# Patient Record
Sex: Female | Born: 1975
Health system: Southern US, Community
[De-identification: ages and names within clinical notes are randomized; demographics above are authoritative.]

## PROBLEM LIST (undated history)

## (undated) DIAGNOSIS — G4730 Sleep apnea, unspecified: Secondary | ICD-10-CM

## (undated) DIAGNOSIS — R112 Nausea with vomiting, unspecified: Secondary | ICD-10-CM

## (undated) DIAGNOSIS — I7300 Raynaud's syndrome without gangrene: Secondary | ICD-10-CM

## (undated) DIAGNOSIS — Z9889 Other specified postprocedural states: Secondary | ICD-10-CM

## (undated) DIAGNOSIS — M1990 Unspecified osteoarthritis, unspecified site: Secondary | ICD-10-CM

## (undated) DIAGNOSIS — E119 Type 2 diabetes mellitus without complications: Secondary | ICD-10-CM

## (undated) HISTORY — PX: WRIST SURGERY: SHX841

## (undated) HISTORY — PX: FOOT SURGERY: SHX648

## (undated) HISTORY — PX: WISDOM TOOTH EXTRACTION: SHX21

## (undated) HISTORY — PX: OOPHORECTOMY: SHX86

## (undated) HISTORY — PX: BACK SURGERY: SHX140

## (undated) HISTORY — PX: OTHER SURGICAL HISTORY: SHX169

---

## 1998-01-10 ENCOUNTER — Other Ambulatory Visit: Admission: RE | Admit: 1998-01-10 | Discharge: 1998-01-10 | Payer: Self-pay | Admitting: Family Medicine

## 1999-04-02 ENCOUNTER — Encounter: Admission: RE | Admit: 1999-04-02 | Discharge: 1999-04-02 | Payer: Self-pay | Admitting: Family Medicine

## 1999-09-26 ENCOUNTER — Encounter: Payer: Self-pay | Admitting: Orthopedic Surgery

## 1999-09-26 ENCOUNTER — Encounter: Admission: RE | Admit: 1999-09-26 | Discharge: 1999-09-26 | Payer: Self-pay | Admitting: Orthopedic Surgery

## 1999-10-28 ENCOUNTER — Other Ambulatory Visit: Admission: RE | Admit: 1999-10-28 | Discharge: 1999-10-28 | Payer: Self-pay | Admitting: Obstetrics and Gynecology

## 2000-01-15 ENCOUNTER — Ambulatory Visit (HOSPITAL_COMMUNITY): Admission: RE | Admit: 2000-01-15 | Discharge: 2000-01-15 | Payer: Self-pay | Admitting: Obstetrics and Gynecology

## 2000-01-15 ENCOUNTER — Encounter: Payer: Self-pay | Admitting: Obstetrics and Gynecology

## 2000-03-31 ENCOUNTER — Encounter: Admission: RE | Admit: 2000-03-31 | Discharge: 2000-06-29 | Payer: Self-pay | Admitting: Obstetrics and Gynecology

## 2000-05-19 ENCOUNTER — Inpatient Hospital Stay (HOSPITAL_COMMUNITY): Admission: AD | Admit: 2000-05-19 | Discharge: 2000-05-19 | Payer: Self-pay | Admitting: Obstetrics and Gynecology

## 2000-05-26 ENCOUNTER — Inpatient Hospital Stay (HOSPITAL_COMMUNITY): Admission: AD | Admit: 2000-05-26 | Discharge: 2000-05-28 | Payer: Self-pay | Admitting: Obstetrics and Gynecology

## 2000-05-29 ENCOUNTER — Encounter: Admission: RE | Admit: 2000-05-29 | Discharge: 2000-08-27 | Payer: Self-pay | Admitting: Obstetrics and Gynecology

## 2000-06-30 ENCOUNTER — Other Ambulatory Visit: Admission: RE | Admit: 2000-06-30 | Discharge: 2000-06-30 | Payer: Self-pay | Admitting: Obstetrics and Gynecology

## 2000-11-23 ENCOUNTER — Encounter (INDEPENDENT_AMBULATORY_CARE_PROVIDER_SITE_OTHER): Payer: Self-pay | Admitting: Specialist

## 2000-11-23 ENCOUNTER — Ambulatory Visit (HOSPITAL_COMMUNITY): Admission: RE | Admit: 2000-11-23 | Discharge: 2000-11-23 | Payer: Self-pay | Admitting: Obstetrics and Gynecology

## 2000-11-23 ENCOUNTER — Encounter (INDEPENDENT_AMBULATORY_CARE_PROVIDER_SITE_OTHER): Payer: Self-pay

## 2000-12-22 ENCOUNTER — Encounter: Admission: RE | Admit: 2000-12-22 | Discharge: 2001-03-22 | Payer: Self-pay | Admitting: Neurology

## 2001-03-15 ENCOUNTER — Encounter: Payer: Self-pay | Admitting: Neurological Surgery

## 2001-03-15 ENCOUNTER — Observation Stay (HOSPITAL_COMMUNITY): Admission: RE | Admit: 2001-03-15 | Discharge: 2001-03-16 | Payer: Self-pay | Admitting: Neurological Surgery

## 2001-04-05 ENCOUNTER — Encounter: Payer: Self-pay | Admitting: Neurological Surgery

## 2001-04-05 ENCOUNTER — Encounter: Admission: RE | Admit: 2001-04-05 | Discharge: 2001-04-05 | Payer: Self-pay | Admitting: Neurological Surgery

## 2001-07-15 ENCOUNTER — Other Ambulatory Visit: Admission: RE | Admit: 2001-07-15 | Discharge: 2001-07-15 | Payer: Self-pay | Admitting: Obstetrics and Gynecology

## 2002-03-25 ENCOUNTER — Ambulatory Visit (HOSPITAL_BASED_OUTPATIENT_CLINIC_OR_DEPARTMENT_OTHER): Admission: RE | Admit: 2002-03-25 | Discharge: 2002-03-25 | Payer: Self-pay | Admitting: Orthopedic Surgery

## 2002-07-06 ENCOUNTER — Ambulatory Visit (HOSPITAL_COMMUNITY): Admission: RE | Admit: 2002-07-06 | Discharge: 2002-07-06 | Payer: Self-pay | Admitting: Obstetrics and Gynecology

## 2002-07-06 ENCOUNTER — Encounter: Payer: Self-pay | Admitting: Obstetrics and Gynecology

## 2002-07-19 ENCOUNTER — Observation Stay (HOSPITAL_COMMUNITY): Admission: EM | Admit: 2002-07-19 | Discharge: 2002-07-20 | Payer: Self-pay

## 2002-08-03 ENCOUNTER — Other Ambulatory Visit: Admission: RE | Admit: 2002-08-03 | Discharge: 2002-08-03 | Payer: Self-pay | Admitting: Obstetrics and Gynecology

## 2002-09-28 ENCOUNTER — Encounter: Payer: Self-pay | Admitting: Obstetrics and Gynecology

## 2002-09-28 ENCOUNTER — Ambulatory Visit (HOSPITAL_COMMUNITY): Admission: RE | Admit: 2002-09-28 | Discharge: 2002-09-28 | Payer: Self-pay | Admitting: Obstetrics and Gynecology

## 2003-02-25 ENCOUNTER — Inpatient Hospital Stay (HOSPITAL_COMMUNITY): Admission: AD | Admit: 2003-02-25 | Discharge: 2003-02-27 | Payer: Self-pay | Admitting: Obstetrics and Gynecology

## 2003-04-14 ENCOUNTER — Emergency Department (HOSPITAL_COMMUNITY): Admission: EM | Admit: 2003-04-14 | Discharge: 2003-04-14 | Payer: Self-pay | Admitting: Emergency Medicine

## 2004-09-04 ENCOUNTER — Other Ambulatory Visit: Admission: RE | Admit: 2004-09-04 | Discharge: 2004-09-04 | Payer: Self-pay | Admitting: Obstetrics and Gynecology

## 2004-10-08 ENCOUNTER — Emergency Department (HOSPITAL_COMMUNITY): Admission: EM | Admit: 2004-10-08 | Discharge: 2004-10-09 | Payer: Self-pay | Admitting: Emergency Medicine

## 2004-11-20 ENCOUNTER — Encounter: Admission: RE | Admit: 2004-11-20 | Discharge: 2004-11-20 | Payer: Self-pay | Admitting: General Surgery

## 2008-09-15 ENCOUNTER — Ambulatory Visit (HOSPITAL_COMMUNITY): Admission: RE | Admit: 2008-09-15 | Discharge: 2008-09-15 | Payer: Self-pay | Admitting: Obstetrics and Gynecology

## 2008-09-15 ENCOUNTER — Encounter (INDEPENDENT_AMBULATORY_CARE_PROVIDER_SITE_OTHER): Payer: Self-pay | Admitting: Obstetrics and Gynecology

## 2010-06-02 ENCOUNTER — Encounter (HOSPITAL_BASED_OUTPATIENT_CLINIC_OR_DEPARTMENT_OTHER): Payer: Self-pay | Admitting: General Surgery

## 2010-08-20 LAB — CBC
HCT: 38 % (ref 36.0–46.0)
Hemoglobin: 13 g/dL (ref 12.0–15.0)

## 2010-09-24 NOTE — Op Note (Signed)
NAME:  Miranda Ross, Miranda Ross                ACCOUNT NO.:  [id number]   MEDICAL RECORD NO.:  [id number]          PATIENT TYPE:  AMB   LOCATION:  SDC                           FACILITY:  [facility]   PHYSICIAN:  Dois Davenport A. Rivard, M.D. DATE OF BIRTH:  [birth year]   DATE OF PROCEDURE:  DATE OF DISCHARGE:                               OPERATIVE REPORT   PREOPERATIVE DIAGNOSES:  Endometrial polyp and contraceptive management.   POSTOPERATIVE DIAGNOSES:  Endometrial polyp and contraceptive  management.   ANESTHESIA:  General, Raul Del, MD   PROCEDURES:  Hysteroscopy, dilation and curettage of expired Mirena  intrauterine device and insertion of new Mirena intrauterine device.   SURGEON:  Crist Fat. Rivard, MD   ASSISTANT:  None.   ESTIMATED BLOOD LOSS:  Minimal.   PROCEDURE IN DETAIL:  After being informed of the planned procedure with  possible complications including bleeding, infection, injury to uterus  and contraceptive benefits and risks of the IUD, informed consent was  obtained.  The patient was taken to OR #3, given general anesthesia with  endotracheal intubation without any complication.  She was placed in the  lithotomy position, prepped and draped in a sterile fashion.  Her  bladder was emptied with an in-and-out red rubber catheter.  Her pelvic  exam was limited by body habitus.  Uterus appears to be anteverted and  normal size.  Adnexa were not felt.   A weighted speculum was inserted.  Anterior lip of the cervix was  grasped with a tenaculum forceps and the old Mirena IUD was removed.  The uterus was then sounded at 8.5 cm and the cervix was easily dilated  using Hegar dilator until #25, which allowed easy entry of a diagnostic  hysteroscope, followed by a operative hysteroscope.  With perfusion of  sorbitol at a maximum pressure of 80 mmHg, we visualized the entire  uterine cavity with both tubal ostia which revealed a decidualized  endometrium.  No evidence of polyp.   Hysteroscope was then removed.  A  sharp curette was used to curette the uterine cavity, which removed a  small amount of normal-appearing endometrium and the hysteroscope was  reinserted to visualize the empty cavity.   Hysteroscope was removed.  The new Mirena IUD was inserted per protocol  and strings were shortened and the tenaculum was removed.   Instruments and sponge count was complete x2.  Estimated blood loss was  minimal.  The procedure was well tolerated by the patient, who was taken  to recovery room and discharged home in a well and stable condition.   SPECIMEN:  Endometrial curettings sent to Pathology.      Crist Fat Rivard, M.D.  Electronically Signed     SAR/MEDQ  D:  09/15/2008  T:  09/16/2008  Job:  [id number]

## 2010-09-24 NOTE — Op Note (Signed)
NAME:  Miranda Ross, Miranda Ross                ACCOUNT NO.:  [id number]   MEDICAL RECORD NO.:  [id number]          PATIENT TYPE:  AMB   LOCATION:  SDC                           FACILITY:  [facility]   PHYSICIAN:  Dois Davenport A. Rivard, M.D. DATE OF BIRTH:  1976-02-01   DATE OF PROCEDURE:  DATE OF DISCHARGE:                               OPERATIVE REPORT   PREOPERATIVE DIAGNOSES:  Endometrial polyps and contraceptive  management.   POSTOPERATIVE DIAGNOSES:  Endometrial polyps and contraceptive  management.   ANESTHESIA:  General, Raul Del, MD   PROCEDURES:  Hysteroscopy, dilatation and curettage, removal of expired  Mirena intrauterine device, insertion of new Mirena intrauterine device.   SURGEON:  Crist Fat. Rivard, MD   ASSISTANT:  None.   ESTIMATED BLOOD LOSS:  Minimal.   PROCEDURE IN DETAIL:  After being informed of the planned procedure with  possible complications including bleeding, infection, injury to uterus  and loss of the IUD, informed consent was obtained.  The patient was  taken to OR #3, given general anesthesia with endotracheal intubation  without any complication.  She was placed in the lithotomy position,  prepped and draped in a sterile fashion and her bladder was emptied with  an in-and-out red rubber catheter.  Pelvic exam was limited by body  habitus, but the uterus appears to be anteverted and normal size and  volume.  Adnexa are not felt.   A weighted speculum was inserted.  Anterior lip of the cervix was  grasped with a tenaculum forceps and the IUD string was grasped, and IUD  was removed.  The cervix was re-prepped with Betadine and the uterus was  sounded at 8.5 cm.  The cervix was easily dilated using Hegar dilator  until #25 which allows easy entry of a diagnostic hysteroscope, followed  by a operative hysteroscope.  With perfusion of sorbitol at a maximum  pressure of 80 mmHg, we were able to visualize the entire uterine cavity  and see more of an irregular  decidualized endometrium than polyps.  The  hysteroscope was removed and we proceeded with sharp curettage of the  endometrial cavity removing a small amount of normal-appearing tissue.  Post curettage, we revisualized the endometrial cavity which appeared  normal.  Hysteroscope was removed and the new Mirena IUD was inserted  per protocol, strings were shortened, and the tenaculum was removed.  Instruments and sponge count was complete x2.  Estimated blood loss  was minimal.  Water deficit was 35 mL.  The procedure was well tolerated  by the patient, who was taken to recovery room in a well and stable  condition.   SPECIMEN:  Endometrial curettings to Pathology.      Crist Fat Rivard, M.D.  Electronically Signed     SAR/MEDQ  D:  09/15/2008  T:  09/16/2008  Job:  [id number]

## 2010-09-27 NOTE — H&P (Signed)
[facility]  Patient:    Miranda Ross, Miranda Ross                        MRN: [id number] Adm. Date:  11/20/00 Attending:  Silverio Lay, M.D.                         History and Physical  DATE OF BIRTH:                [birth year]  REASON FOR ADMISSION:         The patient will undergo left ovarian cystectomy via laparoscopy on November 23, 2000.  HISTORY OF PRESENT ILLNESS:   This is a 35 year old married white female, gravida 1, para 1, abortus 0, with a recent spontaneous vaginal delivery on May 26, 2000, without complication.  During her pregnancy, she was diagnosed with a pelvic mass which essentially remained unchanged throughout her pregnancy.  It was a left ovarian cyst measuring 6.4 x 4.4 x 5.7 cm with small septations.  In February 2002, she had a repeat ultrasound showing left ovarian cyst, 3.8 x 3.4 x 3.3 cm, with multiple septations.  She was placed on Depo-Provera with her last injection in May 2002.  On October 23, 2000, we repeated ultrasound which showed an unchanged left ovarian cyst, 4.5 x 3.3 x 3.6 cm, with a few thin septations and a slight increase from previous ultrasound of February 2002.  Right ovary was within normal limits despite a polycystic ovarian appearance already known for that patient. Uterus is normal, and endometrium is normal.  Otherwise, the patient complains of left lower quadrant pain on and off, stabbing like, no radiation, and this pain has been persistent despite Depo-Provera and complete amenorrhea since March 2002.  She otherwise denies dyspareunia.  REVIEW OF SYSTEMS:            Constitutional: Normal.  HEENT: Normal. Cardiovascular: Normal.  Pulmonary: Patient known to have mild asthma, stable, not using inhalers.  Gastrointestinal: Normal.  Joint/Muscles: Normal. Breasts: Normal.  Neurological: Normal.  Endocrinological: Normal.  PAST MEDICAL HISTORY:         History of polycystic ovarian syndrome.   Mild asthma, and obesity.  The patient had hernia surgery at age 38.  ALLERGIES:                    No known drug allergies.  MEDICATIONS:                  Albuterol p.r.n.  FAMILY HISTORY:               Father deceased of non-Hodgkins lymphoma. Mother alive status post uterine cancer.  SOCIAL HISTORY:               Married, nonsmoker.  Works as a Actuary.  PHYSICAL EXAMINATION:  VITAL SIGNS:                  Normal.  HEENT:                        Negative.  LUNGS:                        Clear.  HEART:                        Normal.  ABDOMEN:  Globulus, normal, with mild tenderness in the left lower quadrant.  No guarding, no rebound.  SKIN:                         Negative.  GYNECOLOGIC:                  Vulva, vagina, cervix normal.  Uterus anteverted and normal.  Adnexa: Exam limited by body habitus.  EXTREMITIES:                  Negative.  ASSESSMENT:                   Persistent ovarian cyst with chronic pelvic pain.  PLAN:                         The patient will undergo laparoscopy cystectomy on November 23, 2000.  Procedure as well as possible complications have been well reviewed with the patient including bleeding, infection, injury to bowels, bladder, or ureters, need for laparotomy, possibility of losing ovary or tube. Her tumor markers have been normal with CA125, CEA, and LDH.  She will use abowel preparation for surgery.  She is also aware that, because of her weight, laparoscopy may be impossible. DD:  11/20/00 TD:  11/20/00 Job: [id number]

## 2010-09-27 NOTE — Op Note (Signed)
[facility]  Patient:    RUTA, CAPECE Visit Number: [id number] MRN: [id number]          Service Type: SUR Location: [id number] Attending Physician:  Jonne Ply Dictated by:   Stefani Dama, M.D. Proc. Date: 03/15/01 Admit Date:  03/15/2001 Discharge Date: 03/16/2001                             Operative Report  PREOPERATIVE DIAGNOSIS:  C5-6 herniated nucleus pulposus with spondylosis, with right cervical radiculopathy.  POSTOPERATIVE DIAGNOSIS:  C5-6 herniated nucleus pulposus with spondylosis, with right cervical radiculopathy.  PROCEDURES: 1. Anterior cervical diskectomy and arthrodesis, C5-6. 2. Structural allograft. 2. Synthes fixation.  SURGEON:  Stefani Dama, M.D.  FIRST ASSISTANT:  Cristi Loron, M.D.  ANESTHESIA:  General endotracheal.  INDICATION:  The patient is a 35 year old white female who has had significant neck, shoulder, and bilateral arm pain, with the right being much more significantly involved than the left.  She has profound spondylitic disease at the C5-6 level singularly and a herniated nucleus pulposus eccentric to the right side.  She was advised regarding surgical decompression.  DESCRIPTION OF PROCEDURE:  The patient was brought to the operating room and placed on the table in supine position.  After the smooth induction of general endotracheal anesthesia, she was placed in five pounds of Holter traction. The neck was shaved, prepped with Duraprep, and draped in a sterile fashion . A transverse incision was made in the midportion of the neck and carried down through the platysma.  The plane between the sternocleidomastoid and the strap muscles was dissected bluntly until the prevertebral space was reached.  The first identifiable disk space was noted to be that of C3-4 and was radiographed.  Dissection was easily carried to the C5-6 space, and the C5-6 disk was then identified  positively.  The longus colli muscle was stripped on either side so that a Caspar retractor could be placed under both of these muscles.  The disk space was then opened with 15 blade, and a combination of Kerrison rongeurs was use to remove a significant amount of moderately degenerated disk material.  As the posterior longitudinal ligament was reached, there was noted to be a significant osteophyte off the inferior margin of the body of C5 out to the lateral recess on either side.  The dissection was carried down with a 2.3 mm dissecting tool and a high-speed drill.  An uncinate process spur was removed on the right side at C5-6.  The left side was opened up with the 2 and 3 mm Kerrison punch.  The posterior longitudinal ligament was opened similarly once the osteophytes were removed to allow for a good decompression of the common dural tube and the takeoff of both nerve roots.  Once this was accomplished, hemostasis was achieved in the soft tissues in the epidural space with some Gelfoam-soaked thrombin pledgets, which were later irrigated away.  A 7 mm tricortical bone graft was fitted with the cortical surface facing dorsally.  The anterior space was then secured with a 16 mm standard-size Synthes plate and four locking 4 x 14 mm screws.  The area was checked radiographically for its fit and then with soft tissue hemostasis being obtained, the platysma was closed with 3-0 Vicryl in interrupted fashion, 3-0 Vicryl was used in the subcuticular tissues.  A clear plastic dressing was placed on the skin.  The patient tolerated the procedure well and was returned to recovery in stable condition. Dictated by:   Stefani Dama, M.D. Attending Physician:  Jonne Ply DD:  03/15/01 TD:  03/16/01 Job: [id number]

## 2010-09-27 NOTE — Discharge Summary (Signed)
NAME:  Miranda Ross, Miranda Ross                          ACCOUNT NO.:  [id number]   MEDICAL RECORD NO.:  [id number]                   PATIENT TYPE:  OBV   LOCATION:  A420                                 FACILITY:  [facility]   PHYSICIAN:  Langley Gauss, M.D.                DATE OF BIRTH:  12-Jun-1975   DATE OF ADMISSION:  07/19/2002  DATE OF DISCHARGE:  07/20/2002                                 DISCHARGE SUMMARY   DIAGNOSES:  1. An 8-week intrauterine pregnancy.  2. Gastroenteritis with nausea, vomiting, and diarrhea.  3. Metabolic disturbance as presented with evidence of dehydration with a     sodium of 130 upon initial laboratory studies.   DISPOSITION:  The patient is to follow up with her M.D. or midwife in  [place] as previously needed.  The patient did present to [facility]  Emergency Room on July 19, 2002 in the p.m. and is an OB unassigned patient.   DISCHARGE MEDICATIONS:  The patient is to continue with Phenergan  suppositories as needed.  In addition, she has Zofran tablets which she has  been using for her first trimester hyperemesis.  I wrote her for Lomotil one  p.o. p.r.n. post each loose stool, up to a max of six per day, #12 with no  refill.   HISTORY:  This is a 35 year old gravida 2, para 1 at [redacted] weeks gestation who  presents to [facility] complaining of an entire day duration of  nausea with vomiting and diarrhea, both occurring too many times to count.  The diarrhea is described as very copious and watery in nature.  She also  describes abdominal spasms.  The patient states she is feeling cold with  chills but she denies any evidence of any fever, stating that her  temperature has been around 100 when she did check it.  Her history is  pertinent in initially her 36-year-old son became ill with similar symptoms  two days previously and most recently her husband has been ill with the  identical symptoms.  The patient did contact her OB care [facility] Scarpulla on July 19, 2002, at which time Phenergan suppositories were called in; however, due  to the patient's diarrhea she was unable to tolerate these and did not  absorb these per rectally.   The patient denies any significant prior history of irritable bowel  syndrome.  She does not experience chronic diarrhea or chronic constipation.   REVIEW OF SYSTEMS:  Pertinent for no complaints of leakage of fluid, no  change in vaginal discharge, no vaginal bleeding.  She does state she has  had no problems with this pregnancy so far with the exception of some  nausea, which was well treated with the p.o. Zofran.  She states that with  her first pregnancy she had significant first trimester hyperemesis.   PAST MEDICAL HISTORY:  1. She had prior C5-6 fusion.  2. Left oophorectomy previously for a cyst.  3. She has history of a right wrist surgery November 2003 for tendon     surgery.   CURRENT MEDICATIONS:  Prenatal vitamins.   ALLERGIES:  She has no known drug allergies.   PAST OBSTETRIC HISTORY:  The patient has one prior delivery of a 35-year-old  infant.  She denies any history of diabetes or hypertension.   SOCIAL HISTORY:  The patient is a nonsmoker.  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   FAMILY HISTORY:  Pertinent for a sister with asthma.   PHYSICAL EXAMINATION:  GENERAL:  An obese female.  VITAL SIGNS:  Temperature 98.1, blood pressure 127/73, heart rate of 126,  respiratory rate is 20.  NECK:  Supple.  Thyroid is nonpalpable.  LUNGS:  Clear.  CARDIOVASCULAR:  Regular rate and rhythm.  ABDOMEN:  Soft and nontender but morbidly obese.  Minimally enlarged uterus  that is nonpalpable.  EXTREMITIES:  Normal.  PELVIC:  Examination not performed although there is noted to be no evidence  of any vaginal bleeding or  leakage of fluid or any change in vaginal  discharge.   LABORATORY STUDIES:  White count of 8.9, hemoglobin 12.3, hematocrit 36.3,  MCV is 81.9.   Liver function tests within normal limits.  Amylase and lipase  within normal limits.  Electrolytes slightly abnormal with a sodium of 130.  BUN and creatinine within normal limits.  A urine specimen obtained about 30  minutes after initiation of IV fluids is negative for ketones, negative for  protein, negative for nitrites, and negative for esterase.   ASSESSMENT:  A 35 year old gravida 2, para 1 at [redacted] weeks gestation with  recent exposure to a probable viral gastroenteritis, now with symptoms  herself.  She has been doing very poorly at home with these with significant  nausea and vomiting today resulting in the metabolic disturbance.  The  patient presented to [facility] Emergency Room the p.m. of July 19, 2002, at  which time she was initially cared for by Dr. Mosetta Putt.  When I was contacted,  the patient had already received 2 L of half-normal saline and had continued  to experience orthostatic symptoms, as well as continued nausea and no p.o.  intake.  Thus, I was contacted the p.m. of July 19, 2002 and verbal orders  were given to place the patient as an observation status to be rehydrated  with D5 LR and continued on a full-liquid diet.   When I evaluated the patient the a.m. of July 20, 2002, the patient had  taken in very small sips of orange juice only.  She was able to ambulate and  void but other than that had minimal ambulation.  She still had some nausea  and had experienced diarrhea that a.m.  Thus, after my initial evaluation,  the patient did describe a very good appetite.  She was advanced to a  regular diet and treated with two p.o. Lomotil for diarrheal stools.  Subsequently, on evaluation the p.m. of July 20, 2002, the patient has  marked improvement with the appetite and nausea.  She has tolerated a  regular general diet for lunch and after the two Lomotil has had no  recurrent diarrhea; however, she is at risk for recurrence of diarrhea, thus she is to be continued  on p.o. Lomotil as clinically indicated at home.   FINAL DIAGNOSES:  1. An 8-week intrauterine pregnancy.  2.  Gastroenteritis with metabolic disturbance.  The patient presenting to     [facility] as an OB unassigned patient July 19, 2002, evaluated, and     discharged from observation status by myself on July 20, 2002.                                              Langley Gauss, M.D.   DC/MEDQ  D:  07/20/2002  T:  07/20/2002  Job:  [id number]

## 2010-09-27 NOTE — H&P (Signed)
NAME:  Miranda Ross, Miranda Ross                          ACCOUNT NO.:  [id number]   MEDICAL RECORD NO.:  [id number]                   PATIENT TYPE:  AMB   LOCATION:  DSC                                  FACILITY:  [facility]   PHYSICIAN:  Cindee Salt, M.D.                    DATE OF BIRTH:  [birth year]   DATE OF ADMISSION:  03/25/2002  DATE OF DISCHARGE:                                HISTORY & PHYSICAL   PREOPERATIVE DIAGNOSES:  TFCC tear right wrist.   POSTOPERATIVE DIAGNOSES:  TFCC tear right wrist.   OPERATION:  Arthroscopy with debridement of tear right wrist.   SURGEON:  Cindee Salt, M.D.   Threasa HeadsBerneda Rose.   ANESTHESIA:  Axillary block.   DATE OF OPERATION:  March 25, 2002.   HISTORY OF PRESENT ILLNESS:  The patient is a 35 year old female with a  history of fall and injury to her right wrist. She complains of pain in the  ulnar aspect. MRI reveals TFCC. The patient was brought to the OR where an  axillary block was carried out without difficulty. She was prepped and  draped using Betadine scrub and solution with the right arm free. The limb  was placed in the arthroscopy tub and 10 pounds of traction applied. The  joint was inflated to 3-4 portal. A portal was opened with a transverse  incision with hemostat. One trochar was used to center the joint. The joint  was inspected. The volar radial wrist ligaments were intact. Ligament was  intact. The volar ulnar wrist ligaments were intact and there were no  cartilaginous lesions. The triangular fiber cartilage complex was inspected.  One tear was noted after placement of an irrigation catheterization in 6U  and opening of 4-5 portal after localization with a #22 gauge needle. A  probe was introduced in the tear probe. This was found to lie in the central  aspect without involvement of either dorsal or palmar ligaments. An arthro  wand was then inserted and the central aspect of the TFCC was then removed.  This was done with the  Arthro wand and suction basket forceps. The dorsal  palmar ligaments were then partially shrunk. The scope was introduced in the  4-5 portal. The ulnar side ligaments were inspected. The lunar __  joint and  volar ulnar ligaments were intact. The carpal joint was then inspected after  inflation through the distal 3-4 portal. The mid carpal joint showed no  instability of the scapholunate and lunar __. There were no cartilaginous  lesions. An irrigation catheter was placed in the 4-5 distal portal and the  carpal joint. No further lesions were identified. The instruments were  removed. The portal was closed with interrupted 5-0 Nylon sutures. Sterile  compressive dressing and splint was applied. The patient tolerated the  procedure well and was taken to the recovery room  for observation in  satisfactory condition.   DISPOSITION:  She is discharged to home.   FOLLOW UP:  To return to the [facility] [place] in one week.                                              Cindee Salt, M.D.   GK/MEDQ  D:  03/25/2002  T:  03/25/2002  Job:  [id number]

## 2010-09-27 NOTE — H&P (Signed)
NAME:  Miranda Ross, Miranda Ross                          ACCOUNT NO.:  [id number]   MEDICAL RECORD NO.:  [id number]                   PATIENT TYPE:  INP   LOCATION:  9165                                 FACILITY:  [facility]   PHYSICIAN:  Crist Fat. Rivard, M.D.              DATE OF BIRTH:  06-07-1975   DATE OF ADMISSION:  02/25/2003  DATE OF DISCHARGE:                                HISTORY & PHYSICAL   HISTORY OF PRESENT ILLNESS:  Miranda Ross is a 35 year old gravida 2, para 1-0-  0-1 who presents at 39-2/7 weeks.  EDD March 02, 2003 by first trimester  ultrasound.  She presents for induction of labor, secondary to LGA; with  favorable cervix, positive fetal movement, no bleeding, no rupture of  membranes.  She denies any PIH symptoms.   PRESENT PREGNANCY:  Her pregnancy has been followed by the M.D. service at  [facility] and is remarkable for:  1. Obesity.  2. Questionable LMP.  3. First trimester bleeding.  4. History of ovarian cyst, left ovary.  Removed due to cysts in 2002.  5. History of back surgery, with fusion of C5 and C6 in 2002.  6. History of asthma.  7. History of gestational diabetes with the first pregnancy.  She has had     two normal Glucola tests with this pregnancy.  8. Group B strep negative.   This patient was initially evaluated at the office of [facility] on July 13, 2002,  at approximately seven weeks gestation.  EDC determined by first trimester  ultrasound.  The patient's pregnancy has been remarkable for low weight  gain.  She has gained only two pounds with this pregnancy, beginning her  pregnancy at 280 pounds, and at last OB visit 282 pounds.  She did lose  weight initially in the first and second trimester with her weight, falling  to 274 pounds at [redacted] weeks gestation, before stabilizing at 280.  She had  some nausea and vomiting initially with her pregnancy; difficulty with  control (she used Zofran and Phenergan and also Imodium for diarrhea).  Mid  second trimester  nausea and vomiting was improved and the patient began  feeling better.  She has measured size greater than dates throughout her  pregnancy, and ultrasound evaluation for follow-up has showed normal growth  and development, normal fluid, no abnormalities.   Estimated fetal weight at 34 weeks is 33,047 g, at the 98th percentile.  Ultrasound at 38 weeks found estimated fetal weight at  8 pounds 4 ounces.  Again, fluid within normal limits and all other  antenatal testing within normal limits.  The patient has been normotensive  throughout her pregnancy, with no proteinuria.  She did experience sinusitis  in the third trimester.  She had a Glucola at 18 and then at 28 weeks --  both were within normal limits.  On evaluation of February 22, 2003 the  patient  was found to be 3 cm dilated; decision was made for induction of  labor secondary to LGA with favorable cervix.   OBSTETRICAL HISTORY:  In 2002 the patient had a normal spontaneous vaginal  delivery at term, with the birth of an 8 pound 1 ounce female infant -- named  Miranda Ross, with no complications.   PRENATAL LAB WORK:  On August 03, 2002 -- Hemoglobin 11.2, hematocrit 35.2,  platelets 356,000.  Blood type B positive. Rh positive.  Antibody screen  negative.  VDRL nonreactive.  Rubella immune.  Hepatitis B surface antigen  negative.  HIV declined.  At 18 weeks one-hour glucose challenge 121 and  again at 28 weeks one-hour glucose within normal limits.  Quad screen within  normal limits .  Pap smear within normal limits.  GC and chlamydia in the  first trimester negative, and at 36 weeks culture of the vaginal tract is  negative for Group B strep and also negative for GC and chlamydia.   MEDICAL HISTORY:  The patient had a history of gestational diabetes with her  first pregnancy, and hyperemesis with her previous pregnancy and also with  this current pregnancy in the first trimester.  The patient has a history of  ovarian cysts,  and had a  left oophorectomy in 2002.  She has a history of  asthma, for which she uses Albuterol inhaler p.r.n.  In 2002 the patient had  fusion of C5 and C6 due to herniated disk.  Her right wrist there was torn  cartilage in 2003, which required surgical repair.   FAMILY HISTORY:  Maternal grandmother MI.  Maternal aunt varicose veins.  Patient's father with diabetes and a maternal aunt with diabetes.  Patient's  sister with sinus glands problem.  Maternal aunt with lupus and  fibromyalgia.  Maternal grandmother fibromyalgia.  The patient's sister with  scleroderma.  The patient's father with non-Hodgkin's lymphoma; also a  maternal uncle with Hodgkin's disease.  On the patient's mother's side of  the family there is known cervical cancer.  Maternal grandfather stroke.  Paternal aunt epilepsy.   GENETIC HISTORY:  There is no genetic history, familial or chromosomal  disorders.  No children that died in infancy or that were born with birth  defects.   ALLERGIES:  NO KNOWN DRUG ALLERGIES.   SOCIAL HISTORY:  Patient denies the use of tobacco, alcohol or illicit  drugs.  Ms. Enneking is a married Caucasian female.  Her husband, Artia Singley,  is involved and supportive.  They are [employer] in their faith.   REVIEW OF SYSTEMS:  As described above.  The patient is typical of one with  a uterine pregnancy at term.  She is admitted for induction of labor.   PHYSICAL EXAMINATION:  VITAL SIGNS:  Stable, afebrile.  HEENT:  Unremarkable.  HEART:  Regular rate and rhythm.  LUNGS:  Clear.  ABDOMEN:  Gravid in its contour.  Uterine fundus is noted to extend 45 cm  above the level of the pubic symphysis.  FETUS:  Leopold's maneuver finds the infant to be in a longitudinalized  cephalic presentation.  Estimated fetal weight, per ultrasound on February 22, 2003 is 8 pounds 4 ounces.  Vertex confirmed on ultrasound.  The fetal heart rate is reactive and reassuring.  The patient is contracting mildly  and  irregularly.  CERVICAL:  (Prior to admission) -- Finds the cervix to be 3 cm dilated, 50%  effaced and the cephalic presenting part at a minus 2  station.  EXTREMITIES:  Show no pathologic edema.  DTRs are 1+, with no clonus.   ASSESSMENT:  Intrauterine pregnancy at 39-2/7 weeks.  Induction of labor for  LGA with favorable cervix.   PLAN:  Admit with orders per Dr. Dois Davenport Rivard.     Rica Koyanagi, C.N.M.               Crist Fat Rivard, M.D.    SDM/MEDQ  D:  02/25/2003  T:  02/25/2003  Job:  [id number]

## 2010-09-27 NOTE — H&P (Signed)
[facility]  Patient:    ESHANI, MAESTRE                        MRN: [id number] Attending:  Silverio Lay, M.D.                         History and Physical  DATE OF BIRTH:                08-10-1975  Prenatal records under [facility] or [employer].  ADMIT DATE:                   Patient will be admitted on May 26, 2000.  REASON FOR ADMISSION:         1. Intrauterine pregnancy at 40 weeks and 1 day.                               2. Class A1 gestational diabetes.                               3. Elective induction of labor.  HISTORY OF PRESENT ILLNESS:   This is a 35 year old, married, white female, gravida 1, para 0, with a due date of May 25, 2000, being admitted at 40 weeks and 1 day to undergo elective induction of labor for reasons of class A1 gestational diabetes and favorable cervix. She was last seen in the office on May 19, 2000 reporting good fetal activity, denying any regular contractions, denying any watery discharge, bleeding, or symptoms of pregnancy induced hypertension. Her class A1 diabetes has always been very well controlled with diet. Her last ultrasound on May 19, 2000 revealed an estimated fetal weight of 7 pounds 4 ounces, or 35th percentile, with normal amniotic fluid index, biophysical profile 8/8. Fetal heart rate was not reactive at the time but reassuring.  PRENATAL COURSE:              Blood type B positive, RPR nonreactive, rubella immune, HBSAG negative, HIV negative, gonorrhea negative, Chlamydia negative.  A 16-week AFP was within normal limits. A 16-week glucose tolerance test was within normal limits. A 20-week ultrasound revealed a normal anatomy survey but RVOT/LVOT not well seen; fundal placenta; cervical length 3.5 cm; dating corresponded. Ultrasound study at that time also revealed a persistent left ovarian cyst measuring 6.6 x 3.6 x 5.3 cm with a thin septation.  Previous ultrasound at [redacted] weeks gestation revealed a left ovarian cyst measuring 6.1 x 3.8 x 3.5 cm with a second cyst of 2.6 x 3.1 x 1.8 cm and a third cyst, 2.1 x 2.7 x 1.8 cm. Repeated at 15 weeks, this ultrasound revealed a left ovarian cyst 6.4 x 4.4 x 5.7 cm, unchanged from previous exam. A 28-week ultrasound revealed an unchanged ovarian cyst measuring 6.1 x 3.3 x 4.0 cm, estimated fetal weight at [facility], amniotic fluid index normal.  A 28-week glucose tolerance test was elevated and three-hour glucose tolerance test was compatible with gestational diabetes. Ultrasound at 33+ weeks: estimated fetal weight 93rd percentile, amniotic fluid index normal, slight decrease in ovarian cyst size, 5.9 x 3.6 x 3.7 cm. Ultrasound at 36 weeks: estimated fetal weight 85th percentile, amniotic fluid index normal, left ovarian cyst unchanged, 5.8 x 2.9 x 5.4 cm. A 35 week group B  strep was negative. Prenatal course was otherwise uneventful.  PAST MEDICAL HISTORY:         No known drug allergies. History of PCO. History of asthma well controlled with Advair twice a day and Albuterol p.r.n. History of hernia surgery correction at [age] years of age.  SOCIAL HISTORY:               Married, nonsmoker. Works as a Pharmacologist.  FAMILY HISTORY:               Father deceased of non-Hodgkins lymphoma. Mother alive but history of uterine cancer.  PHYSICAL EXAMINATION:  VITAL SIGNS:                  Current weight is 258 pounds for a total weight gain of 16 pounds. Blood pressure 116/78.  HEENT:                        Negative.  LUNGS:                        Clear.  HEART:                        Normal.  ABDOMEN:                      Gravid, nontender. Fundal height 39 cm.  VAGINAL EXAM:                 3+ cm, 60% effaced, vertex, -1.  EXTREMITIES:                  Negative.  ASSESSMENT:                   1. Intrauterine pregnancy at 40 weeks and 1 day.                                2. Class A1 well-controlled gestational                                  diabetes.                               3. Persistent unchanged left ovarian cyst.  PLAN:                         Patient is admitted to undergo elective induction of labor for reasons of diabetes with favorable cervix at term. Will proceed with artificial rupture of membranes and Pitocin. Patient is well aware that induction may lead to slightly increased risk of cesarean section. Will control ovarian cysts at six weeks postpartum with ultrasound. DD:  05/19/00 TD:  05/19/00 Job: [id number]

## 2010-09-27 NOTE — Op Note (Signed)
[facility]  Patient:    Miranda Ross, Miranda Ross                         MRN: [id number] Proc. Date: 11/23/00 Adm. Date:  [id number] Attending:  Silverio Lay A                           Operative Report  PREOPERATIVE DIAGNOSIS:       Persistent left ovarian cyst.  POSTOPERATIVE DIAGNOSIS:      Left ovarian cyst adenofibroma per                               frozen section.  ANESTHESIA:                   General.  PROCEDURE:                    Left salpingo-oophorectomy by laparoscopy                               with peritoneal washings.  SURGEON:                      Silverio Lay, M.D.  ASSISTANT:                    Pershing Cox, M.D.  ESTIMATED BLOOD LOSS:         100 cc.  DESCRIPTION OF PROCEDURE:     After being informed of the planned procedure which was initially a left ovarian cystectomy via laparoscopy, plus or minus laparotomy, plus or minus ovariectomy, as well as the possible complications including bleeding, infection, injury to bowel, bladder, and ureters, need for a laparotomy, need for definitive surgery if malignant pathology.  An informed consent was obtained.  The patient was taken to the operating room #2 and given general anesthesia with endotracheal intubation, and placed in the lithotomy position.  She was prepped and draped in a sterile fashion.  Her bladder was emptied with the Foley catheter.  A speculum was inserted.  The anterior lip of the cervix was grasped with a tenaculum and an acorn manipulator was inserted.  The speculum was removed.  An umbilical incision was made with a knife over 10.0 mm, to allow the insertion of the long Veress needle easily.  This allowed [place] to insufflate a pneumoperitoneum at a maximum pressure of 15 mmHg, and then remove the Veress needle and insert a long 12 mm trocar.  The laparoscope was inserted to confirm interperitoneal placement.  A 10 mm suprapubic trocar was then inserted under  direct visualization, and using a palpator, we can now evaluate the pelvis.  OBSERVATION:  The anterior cul-de-sac is normal.  The uterus is normal in shape and size.  The posterior cul-de-sac is normal.  The right tube is normal.  The right ovary is slightly bigger in volume, typical of a polycystic ovarian syndrome; and on its posterior surface, we see a few vesicles clear. There is also one small vesicle of a few mm close to the utero-ovarian ligament.  The left tube is essentially normal until the fimbrial end which is involved in a large ovarian mass, measuring 6.0 to 7.0 cm, and around the fimbrial end we see multiple solid nodules.  The utero-ovarian ligament is slightly increased in length, and a few adhesions with the bowel are seen. Other peritoneal surfaces are negative.  The liver is normal.  The appendix is impossible to visualize, due to body habitus.  With the consult of Dr. Pershing Cox, it is decided to perform a left salpingo-oophorectomy due to the unusual appearance of the ovary, as well as the ovarian surface nodules.  We start with inserting a 5 mm trocar in the left lower quadrant under direct visualization, which now allows [place] to sharply dissect with scissors all the adhesions, so the ovary is free and mobile.  We then put our attention on the left parietal wall, so we can visualize the ureter, which is very far away from our infundibular pelvic ligament, which is then grasped with the tripolar, cauterized, and sectioned.  The utero-ovarian ligament is then grasped with the tripolar, cauterized, and sectioned, and the whole ovary and tube are freed completely.  Hemostasis is completed with the tripolar.  Via the suprapubic 10 mm trocar, an Endo bag is inserted into the pelvic cavity, in which the specimen is placed.  We then bring that back to the skin, and have to extend our suprapubic incision to a total length of about 5.0 cm.  We remove the whole ovary  and tube in the Endo bag, completely intact, after attempting to rupture and suction through, which was impossible.  The specimen is sent for frozen section.  Please note that prior to performing the left salpingo-oophorectomy, the peritoneal washings were performed via the suprapubic trocar.  We then closed the fascia of the suprapubic incision with a running suture of #0 Vicryl.  We go back in laparoscopy time, irrigate with warm saline, and complete hemostasis on one of the adhesions with the bowel with the tripolar.  Hemostasis on the IP ligament is assessed and adequate. More attention is put on the right ovary, and we then remove all our trocars after evacuating the pneumoperitoneum.  It is impossible to close the fascia of the umbilical incision, so we proceed with closing skin of all incisions with subcuticular #4-0 Vicryl stitches.  Steri-Strips are also applied.  The frozen section comes back completely benign cyst adenofibroma of the ovary. The intrauterine manipulator is also removed.  The instrument and sponge count is complete x 2.  The estimated blood loss is less than 100 cc.  The procedure is well-tolerated by the patient, who is taken to the recovery room in a well and stable condition. DD:  11/23/00 TD:  11/23/00 Job: [id number]

## 2011-02-19 ENCOUNTER — Emergency Department (HOSPITAL_COMMUNITY)
Admission: EM | Admit: 2011-02-19 | Discharge: 2011-02-19 | Disposition: A | Discharge status: Home / Self Care | Payer: 59 | Attending: Emergency Medicine | Admitting: Emergency Medicine

## 2011-02-19 ENCOUNTER — Encounter: Payer: Self-pay | Admitting: *Deleted

## 2011-02-19 ENCOUNTER — Emergency Department (HOSPITAL_COMMUNITY): Payer: 59

## 2011-02-19 DIAGNOSIS — W260XXA Contact with knife, initial encounter: Secondary | ICD-10-CM | POA: Insufficient documentation

## 2011-02-19 DIAGNOSIS — J45909 Unspecified asthma, uncomplicated: Secondary | ICD-10-CM | POA: Insufficient documentation

## 2011-02-19 DIAGNOSIS — M129 Arthropathy, unspecified: Secondary | ICD-10-CM | POA: Insufficient documentation

## 2011-02-19 DIAGNOSIS — S61219A Laceration without foreign body of unspecified finger without damage to nail, initial encounter: Secondary | ICD-10-CM

## 2011-02-19 DIAGNOSIS — S61209A Unspecified open wound of unspecified finger without damage to nail, initial encounter: Secondary | ICD-10-CM | POA: Insufficient documentation

## 2011-02-19 HISTORY — DX: Unspecified osteoarthritis, unspecified site: M19.90

## 2011-02-19 NOTE — ED Notes (Signed)
Patient cut finger to left middle finger while drying off a knife

## 2011-02-19 NOTE — ED Provider Notes (Signed)
History     CSN: [id number] Arrival date & time: 02/19/2011  7:43 PM  Chief Complaint  Patient presents with  . Laceration    (Consider location/radiation/quality/duration/timing/severity/associated sxs/prior treatment) HPI Comments: Pt was drying a knife when she cut the tip of the left middle finger.  Patient is a 35 y.o. female presenting with skin laceration. The history is provided by the patient.  Laceration  The incident occurred less than 1 hour ago. The laceration is located on the left hand. The laceration is 1 cm in size. The laceration mechanism was a a clean knife. The pain is moderate. The pain has been constant since onset. Her tetanus status is UTD.    Past Medical History  Diagnosis Date  . Arthritis   . Asthma     Past Surgical History  Procedure Date  . Oophorectomy   . Neck fusion   . Right hand surgery     History reviewed. No pertinent family history.  History  Substance Use Topics  . Smoking status: Never Smoker   . Smokeless tobacco: Not on file  . Alcohol Use: No    OB History    Grav Para Term Preterm Abortions TAB SAB Ect Mult Living                  Review of Systems  Constitutional: Negative for activity change.       All ROS Neg except as noted in HPI  HENT: Negative for nosebleeds and neck pain.   Eyes: Negative for photophobia and discharge.  Respiratory: Positive for wheezing. Negative for cough and shortness of breath.   Cardiovascular: Negative for chest pain and palpitations.  Gastrointestinal: Negative for abdominal pain and blood in stool.  Genitourinary: Negative for dysuria, frequency and hematuria.  Musculoskeletal: Positive for joint swelling. Negative for back pain and arthralgias.  Skin: Negative.   Neurological: Negative for dizziness, seizures and speech difficulty.  Psychiatric/Behavioral: Negative for hallucinations and confusion.    Allergies  Review of patient's allergies indicates no known  allergies.  Home Medications   Current Outpatient Rx  Name Route Sig Dispense Refill  . NAPROXEN SODIUM 750 MG PO TB24 Oral Take 1 tablet by mouth 2 (two) times daily.      . TRAMADOL HCL 50 MG PO TABS Oral Take 50 mg by mouth at bedtime. For sleep     . ALBUTEROL SULFATE HFA 108 (90 BASE) MCG/ACT IN AERS Inhalation Inhale 2 puffs into the lungs every 6 (six) hours as needed. For shortness of breath       BP 136/91  Pulse 99  Temp(Src) 97.8 F (36.6 C) (Oral)  Resp 20  Ht 5\' 3"  (1.6 m)  Wt 320 lb (145.151 kg)  BMI 56.69 kg/m2  SpO2 100%  Physical Exam  Nursing note and vitals reviewed. Constitutional: She is oriented to person, place, and time. She appears well-developed and well-nourished.  Non-toxic appearance.  HENT:  Head: Normocephalic.  Right Ear: Tympanic membrane and external ear normal.  Left Ear: Tympanic membrane and external ear normal.  Eyes: EOM and lids are normal. Pupils are equal, round, and reactive to light.  Neck: Normal range of motion. Neck supple. Carotid bruit is not present.  Cardiovascular: Normal rate, regular rhythm, normal heart sounds, intact distal pulses and normal pulses.   Pulmonary/Chest: Breath sounds normal. No respiratory distress.  Abdominal: Soft. Bowel sounds are normal. There is no tenderness. There is no guarding.  Musculoskeletal: Normal range of motion.  Laceration of the tip of the left middle finger. No bone or tendon visible.  Lymphadenopathy:       Head (right side): No submandibular adenopathy present.       Head (left side): No submandibular adenopathy present.    She has no cervical adenopathy.  Neurological: She is alert and oriented to person, place, and time. She has normal strength. No cranial nerve deficit or sensory deficit.  Skin: Skin is warm and dry.  Psychiatric: She has a normal mood and affect. Her speech is normal.    ED Course  LACERATION REPAIR Date/Time: 02/19/2011 9:01 PM Performed by: Kathie Dike Authorized by: Kathie Dike Consent: Verbal consent obtained. Risks and benefits: risks, benefits and alternatives were discussed Consent given by: patient Patient understanding: patient states understanding of the procedure being performed Patient identity confirmed: verbally with patient Time out: Immediately prior to procedure a "time out" was called to verify the correct patient, procedure, equipment, support staff and site/side marked as required. Body area: upper extremity Location details: left long finger Laceration length: 1.2 cm Foreign bodies: no foreign bodies Tendon involvement: none Nerve involvement: none Vascular damage: no Anesthesia: digital block Local anesthetic: lidocaine 1% without epinephrine Patient sedated: no Preparation: Patient was prepped and draped in the usual sterile fashion. Irrigation solution: saline Amount of cleaning: standard Debridement: none Degree of undermining: none Skin closure: 4-0 nylon Number of sutures: 4 Technique: simple Approximation: close Approximation difficulty: simple Dressing: gauze roll Patient tolerance: Patient tolerated the procedure well with no immediate complications. Comments: Sterile dressing applied by me   (including critical care time)  Labs Reviewed - No data to display No results found.   Dx: 1. Laceration of the left 3rd finger   MDM  I have reviewed nursing notes, vital signs, and all appropriate lab and imaging results for this patient.        Kathie Dike, [place] 02/19/11 2205

## 2011-02-20 NOTE — ED Provider Notes (Signed)
Medical screening examination/treatment/procedure(s) were performed by non-physician practitioner and as supervising physician I was immediately available for consultation/collaboration. Devoria Albe, MD, Armando Gang   Ward Givens, MD 02/20/11 [phone]

## 2011-05-13 DIAGNOSIS — G4730 Sleep apnea, unspecified: Secondary | ICD-10-CM

## 2011-05-13 HISTORY — DX: Sleep apnea, unspecified: G47.30

## 2012-08-20 ENCOUNTER — Other Ambulatory Visit: Payer: Self-pay | Admitting: Orthopedic Surgery

## 2012-08-25 ENCOUNTER — Encounter (HOSPITAL_BASED_OUTPATIENT_CLINIC_OR_DEPARTMENT_OTHER): Payer: Self-pay | Admitting: *Deleted

## 2012-09-01 NOTE — H&P (Signed)
  Miranda Ross presented for evaluation of a cystic mass over her A-3 pulley of the left ring finger and a cystic mass presenting at her STT joint on the volar aspect of her left wrist.    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 She has been monitoring her hemoglobin A1C and noted her last measurement was 6.2. She presents for consult requesting excision of the cyst from her ring finger which had been present eight months. She also has a mass on the volar aspect of the wrist consistent with probable FCR ganglion.    Her past history is reviewed in detail. She has no drug allergies.    Current medications are Naprelan 750 b.i.d., Ultram 50 mg. b.i.d. PRN, albuterol two puffs PRN, vitamin D 50,000 i.u. twice weekly.  Prior surgery, left oophorectomy 2002, C5-6 diskectomy fusion 2002 and right hand surgery in 2003.   Social history reveals that she is married. She is a nonsmoker, does not drink alcoholic beverages.    Family history is detailed and positive for mother experiencing diabetes, high blood pressure and arthritis.   14-point review of systems reveals corrective lenses, history of asthma and exertional shortness of breath.    Physical examination reveals a very pleasant 37 year-old woman.  Inspection of her hand reveals a cyst overlying the A-2 pulley of her left ring finger. This may be a myxoid cyst of a superficial vein.  It is possible that there is some adherence between the cystic lesion and her flexor sheath. She also has an 8 millimeter in diameter cyst over the volar aspect of her left wrist at the distal wrist flexion crease.  This is likely a FCR ganglion.  I explained to her how complex these could be often originating at the carpometacarpal joint of the index finger or at the carpometacarpal joint of the thumb.    Her neurovascular examination is intact.  Plain films of her hand and wrist demonstrate normal bony anatomy.   We had a detailed  discussion regarding her predicament. We will excise the cyst in her finger under local anesthesia.   H&P documentation: 09/02/2012  -History and Physical Reviewed  -Patient has been re-examined  -No change in the plan of care  Wyn Forster, MD

## 2012-09-02 ENCOUNTER — Encounter (HOSPITAL_BASED_OUTPATIENT_CLINIC_OR_DEPARTMENT_OTHER)
Admission: RE | Disposition: A | Discharge status: Home / Self Care | Payer: Self-pay | Source: Ambulatory Visit | Attending: Orthopedic Surgery

## 2012-09-02 ENCOUNTER — Ambulatory Visit (HOSPITAL_BASED_OUTPATIENT_CLINIC_OR_DEPARTMENT_OTHER)
Admission: RE | Admit: 2012-09-02 | Discharge: 2012-09-02 | Disposition: A | Discharge status: Home / Self Care | Payer: 59 | Source: Ambulatory Visit | Attending: Orthopedic Surgery | Admitting: Orthopedic Surgery

## 2012-09-02 DIAGNOSIS — D2360 Other benign neoplasm of skin of unspecified upper limb, including shoulder: Secondary | ICD-10-CM | POA: Insufficient documentation

## 2012-09-02 HISTORY — DX: Type 2 diabetes mellitus without complications: E11.9

## 2012-09-02 HISTORY — PX: MASS EXCISION: SHX2000

## 2012-09-02 HISTORY — DX: Other specified postprocedural states: Z98.890

## 2012-09-02 HISTORY — DX: Sleep apnea, unspecified: G47.30

## 2012-09-02 HISTORY — DX: Nausea with vomiting, unspecified: R11.2

## 2012-09-02 SURGERY — MINOR EXCISION OF MASS
Anesthesia: LOCAL | Site: Wrist | Laterality: Left | Wound class: Clean

## 2012-09-02 MED ORDER — LIDOCAINE HCL 2 % IJ SOLN
INTRAMUSCULAR | Status: DC | PRN
  Administered 2012-09-02: 2 mL

## 2012-09-02 MED ORDER — TRAMADOL HCL 50 MG PO TABS: 50.0000 mg | ORAL_TABLET | Freq: Four times a day (QID) | ORAL | Status: DC | PRN

## 2012-09-02 MED ORDER — MIDAZOLAM HCL 2 MG/2ML IJ SOLN: 1.0000 mg | INTRAMUSCULAR | Status: DC | PRN

## 2012-09-02 MED ORDER — LACTATED RINGERS IV SOLN: INTRAVENOUS | Status: DC

## 2012-09-02 MED ORDER — CEFAZOLIN SODIUM-DEXTROSE 2-3 GM-% IV SOLR: 2.0000 g | INTRAVENOUS | Status: DC

## 2012-09-02 MED ORDER — FENTANYL CITRATE 0.05 MG/ML IJ SOLN: 50.0000 ug | INTRAMUSCULAR | Status: DC | PRN

## 2012-09-02 MED ORDER — CHLORHEXIDINE GLUCONATE 4 % EX LIQD: 60.0000 mL | Freq: Once | CUTANEOUS | Status: DC

## 2012-09-02 SURGICAL SUPPLY — 40 items
BANDAGE ADHESIVE 1X3 (GAUZE/BANDAGES/DRESSINGS) IMPLANT
BANDAGE ELASTIC 3 VELCRO ST LF (GAUZE/BANDAGES/DRESSINGS) IMPLANT
BANDAGE GAUZE ELAST BULKY 4 IN (GAUZE/BANDAGES/DRESSINGS) IMPLANT
BLADE MINI RND TIP GREEN BEAV (BLADE) IMPLANT
BLADE SURG 15 STRL LF DISP TIS (BLADE) ×2 IMPLANT
BLADE SURG 15 STRL SS (BLADE) ×1
BNDG COHESIVE 1X5 TAN STRL LF (GAUZE/BANDAGES/DRESSINGS) ×3 IMPLANT
BNDG ESMARK 4X9 LF (GAUZE/BANDAGES/DRESSINGS) IMPLANT
BRUSH SCRUB EZ PLAIN DRY (MISCELLANEOUS) ×3 IMPLANT
CLOTH BEACON ORANGE TIMEOUT ST (SAFETY) ×3 IMPLANT
CORDS BIPOLAR (ELECTRODE) IMPLANT
COVER MAYO STAND STRL (DRAPES) ×3 IMPLANT
COVER TABLE BACK 60X90 (DRAPES) ×3 IMPLANT
CUFF TOURNIQUET SINGLE 18IN (TOURNIQUET CUFF) IMPLANT
DECANTER SPIKE VIAL GLASS SM (MISCELLANEOUS) IMPLANT
DRAPE EXTREMITY T 121X128X90 (DRAPE) IMPLANT
DRAPE SURG 17X23 STRL (DRAPES) ×3 IMPLANT
GLOVE BIOGEL M STRL SZ7.5 (GLOVE) IMPLANT
GLOVE ORTHO TXT STRL SZ7.5 (GLOVE) ×3 IMPLANT
GOWN BRE IMP PREV XXLGXLNG (GOWN DISPOSABLE) ×3 IMPLANT
GOWN PREVENTION PLUS XLARGE (GOWN DISPOSABLE) ×3 IMPLANT
NEEDLE 27GAX1X1/2 (NEEDLE) ×3 IMPLANT
PACK BASIN DAY SURGERY FS (CUSTOM PROCEDURE TRAY) ×3 IMPLANT
PAD CAST 3X4 CTTN HI CHSV (CAST SUPPLIES) IMPLANT
PADDING CAST ABS 4INX4YD NS (CAST SUPPLIES)
PADDING CAST ABS COTTON 4X4 ST (CAST SUPPLIES) IMPLANT
PADDING CAST COTTON 3X4 STRL (CAST SUPPLIES)
SPLINT PLASTER CAST XFAST 3X15 (CAST SUPPLIES) IMPLANT
SPLINT PLASTER XTRA FASTSET 3X (CAST SUPPLIES)
SPONGE GAUZE 4X4 12PLY (GAUZE/BANDAGES/DRESSINGS) ×3 IMPLANT
STOCKINETTE 4X48 STRL (DRAPES) ×3 IMPLANT
STRIP CLOSURE SKIN 1/2X4 (GAUZE/BANDAGES/DRESSINGS) ×3 IMPLANT
SUT PROLENE 3 0 PS 2 (SUTURE) ×3 IMPLANT
SUT VIC AB 4-0 P-3 18XBRD (SUTURE) IMPLANT
SUT VIC AB 4-0 P3 18 (SUTURE)
SYR 3ML 23GX1 SAFETY (SYRINGE) IMPLANT
SYR CONTROL 10ML LL (SYRINGE) ×3 IMPLANT
TOWEL OR 17X24 6PK STRL BLUE (TOWEL DISPOSABLE) ×3 IMPLANT
TRAY DSU PREP LF (CUSTOM PROCEDURE TRAY) ×3 IMPLANT
UNDERPAD 30X30 INCONTINENT (UNDERPADS AND DIAPERS) ×3 IMPLANT

## 2012-09-02 NOTE — Brief Op Note (Signed)
09/02/2012  9:08 AM  PATIENT:  Miranda Ross  37 y.o. female  PRE-OPERATIVE DIAGNOSIS:  FLEXOR CYST LEFT RING VOLAR CYST AT STS JOINT  POST-OPERATIVE DIAGNOSIS:  flexor cyst left ring volar cyst at sts joint  PROCEDURE:  Excision of thrombosed myxoid cyst of left ring pip flexion crease  SURGEON:  Surgeon(s) and Role:    * Wyn Forster., MD - Primary  PHYSICIAN ASSISTANT:   ASSISTANTS: none   ANESTHESIA:   local  EBL:     BLOOD ADMINISTERED:none  DRAINS: none   LOCAL MEDICATIONS USED:  LIDOCAINE   SPECIMEN:  Biopsy / Limited Resection  DISPOSITION OF SPECIMEN:  PATHOLOGY  COUNTS:  YES  TOURNIQUET:  * No tourniquets in log *  DICTATION: .Other Dictation: Dictation Number [id number]  PLAN OF CARE: Discharge to home after PACU  PATIENT DISPOSITION:  PACU - hemodynamically stable.   Delay start of Pharmacological VTE agent (>24hrs) due to surgical blood loss or risk of bleeding: not applicable

## 2012-09-02 NOTE — Op Note (Signed)
[id number]

## 2012-09-03 NOTE — Op Note (Signed)
NAMEANGELLINA, FERDINAND                ACCOUNT NO.:  [id number]  MEDICAL RECORD NO.:  [id number]  LOCATION:                                 FACILITY:  PHYSICIAN:  Katy Fitch. Carianna Lague, M.D. DATE OF BIRTH:  Jan 08, 1976  DATE OF PROCEDURE:  09/02/2012 DATE OF DISCHARGE:                              OPERATIVE REPORT   PREOPERATIVE DIAGNOSIS:  Painful chronic mass, left ring finger, flexor crease of proximal interphalangeal joint/A3 pulley region, rule out thrombosed vein versus thrombosed myxoid cyst, unresponsive to months of observation.  POSTOPERATIVE DIAGNOSIS:  Painful chronic mass, left ring finger, flexor crease of proximal interphalangeal joint/A3 pulley region, rule out thrombosed vein versus thrombosed myxoid cyst, unresponsive to months of observation with confirmation of a thrombosed myxoid cyst involving 1 of the volar veins.  OPERATION:  Resection of a thrombosed myxoid cyst, left ring finger proximal interphalangeal flexion crease.  OPERATING SURGEON:  Katy Fitch. Karandeep Resende, M.D.  ASSISTANT:  Scrub nurse.  ANESTHESIA:  2% lidocaine metacarpal head level block of left ring finger.  ANESTHETIC VOLUME:  2 mL of plain 2% lidocaine.  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 Pollyann Savoy for evaluation and management of a cyst at the volar aspect of her left wrist adjacent to flexor carpi radialis tendon, and a painful mass at her ring finger PIP flexion crease.  Ms. Latif reports that she has observed this mass in her finger for many months without resolution.  She presented for evaluation of both predicaments.  I advised her that the cyst at the wrist level had a high chance of recurrence if debrided.  Typically these cysts will originate at the carpometacarpal joint of the index finger or thumb and contract quite a ways alongside the flexor carpi radialis tendon.  The recurrence rate based on experience is quite high, therefore  as this is not particularly problematic for her, she decided to not address the wrist cyst at this time.  The finger mass however was problematic causing pain with prehension. She had put up with this for months and requested that we resect this under local anesthesia.  Preoperatively, I pointed out to her that this could be a myxoid cyst versus a thrombosed vein.  It did not appear to be a traditional flexor sheath cyst.  Preoperatively, her medical history was reviewed in detail.  She was noted to have obstructive sleep apnea and does use a CPAP machine.  She was interviewed preoperatively in the holding area and the proper surgical site identified per protocol.  She once again reinforced her decision that she did not want to address the volar wrist cyst.  After detailed informed consent with questions being invited and answered, she is brought to the operating room at this time for resection under straight local anesthesia.  PROCEDURE:  Olevia Westervelt was escorted to room 6 of the [facility] and placed in a supine position upon the operating table.  Following alcohol and Betadine prep and informed consent, 2 mL of 2% lidocaine were infiltrated around the proper digital nerves to the left ring finger metacarpal head level.  After 5 minutes, excellent anesthesia of left  ring finger was achieved.  The left hand and arm were then prepped with Betadine soap and solution, sterilely draped.  Following routine surgical time-out confirming the proper surgical site, we proceeded to exsanguinate the left ring finger with a direct gauze wrap followed by compression and application of a quarter-inch Penrose drain at the proximal phalangeal level.  Satisfactory hemostasis was achieved.  Confirming proper anesthesia, we proceeded with a transverse incision directly over the mass.  Subcutaneous tissues were carefully divided taking care to identify and protect the neurovascular  structures.  A black blue mass measuring 3 mm diameter was identified that appeared to be a myxoid cyst with thrombosis growing on 1 of the volar veins.  We identified a 2nd budding mass in the radial aspect in another vein. Both cysts were carefully circumferentially dissected and excised.  Hemostasis was achieved by direct pressure for approximately 3 minutes.  The wound was then repaired with intradermal 4-0 Prolene and Steri- Strips.  The Penrose drain was removed and pressure was held on the wound for an additional 5 minutes followed by elevation of the hand for 10 minutes.  Ms. Widjaja tolerated the procedure well.  I showed her the cyst intraoperatively before passing it off for pathologic evaluation.  There were no apparent complications.  For aftercare, she is provided prescription for tramadol 1 tablet p.o. q.4-6 hours p.r.n. pain, 20 tablets without refill.  I will see her back in the office for followup in a week for dressing change, suture removal, and advancement to an exercise program.  She will not wear rings for 3-4 weeks as her finger will swell following this procedure.  Questions were invited and answered.  She is provided extra Steri-Strips and Coban to use at home over the weekend if she should have her dressing get wet or soiled.     Katy Fitch Yarima Penman, M.D.     RVS/MEDQ  D:  09/02/2012  T:  09/02/2012  Job:  [id number]

## 2012-09-08 ENCOUNTER — Encounter (HOSPITAL_BASED_OUTPATIENT_CLINIC_OR_DEPARTMENT_OTHER): Payer: Self-pay | Admitting: Orthopedic Surgery

## 2013-05-18 ENCOUNTER — Other Ambulatory Visit: Payer: Self-pay | Admitting: Orthopedic Surgery

## 2013-05-18 DIAGNOSIS — M25562 Pain in left knee: Secondary | ICD-10-CM

## 2013-05-26 ENCOUNTER — Ambulatory Visit
Admission: RE | Admit: 2013-05-26 | Discharge: 2013-05-26 | Disposition: A | Discharge status: Home / Self Care | Payer: 59 | Source: Ambulatory Visit | Attending: Orthopedic Surgery | Admitting: Orthopedic Surgery

## 2013-05-26 DIAGNOSIS — M25562 Pain in left knee: Secondary | ICD-10-CM

## 2013-06-18 ENCOUNTER — Encounter: Payer: Self-pay | Admitting: *Deleted

## 2013-06-18 DIAGNOSIS — R319 Hematuria, unspecified: Secondary | ICD-10-CM | POA: Insufficient documentation

## 2013-06-18 DIAGNOSIS — E785 Hyperlipidemia, unspecified: Secondary | ICD-10-CM | POA: Insufficient documentation

## 2014-06-21 ENCOUNTER — Other Ambulatory Visit: Payer: Self-pay | Admitting: Rheumatology

## 2014-06-21 ENCOUNTER — Ambulatory Visit
Admission: RE | Admit: 2014-06-21 | Discharge: 2014-06-21 | Disposition: A | Discharge status: Home / Self Care | Payer: 59 | Source: Ambulatory Visit | Attending: Rheumatology | Admitting: Rheumatology

## 2014-06-21 DIAGNOSIS — R899 Unspecified abnormal finding in specimens from other organs, systems and tissues: Secondary | ICD-10-CM

## 2014-08-08 ENCOUNTER — Other Ambulatory Visit: Payer: Self-pay | Admitting: Rheumatology

## 2014-08-08 DIAGNOSIS — M542 Cervicalgia: Secondary | ICD-10-CM

## 2014-09-03 ENCOUNTER — Other Ambulatory Visit: Payer: 59

## 2015-04-02 ENCOUNTER — Ambulatory Visit: Payer: 59 | Attending: Podiatry

## 2015-04-02 DIAGNOSIS — M259 Joint disorder, unspecified: Secondary | ICD-10-CM | POA: Insufficient documentation

## 2015-04-02 DIAGNOSIS — R262 Difficulty in walking, not elsewhere classified: Secondary | ICD-10-CM | POA: Diagnosis present

## 2015-04-02 DIAGNOSIS — M25672 Stiffness of left ankle, not elsewhere classified: Secondary | ICD-10-CM | POA: Diagnosis not present

## 2015-04-02 DIAGNOSIS — M79672 Pain in left foot: Secondary | ICD-10-CM | POA: Insufficient documentation

## 2015-04-02 DIAGNOSIS — R29898 Other symptoms and signs involving the musculoskeletal system: Secondary | ICD-10-CM

## 2015-04-02 NOTE — Patient Instructions (Signed)
Active DF stretching LT ankle multiple times during day as able and night x 15-20 reps

## 2015-04-02 NOTE — Therapy (Signed)
[place], [place], [address] Phone: [phone]   Fax:  [phone]  Physical Therapy Evaluation  Patient Details  Name: Miranda Ross MRN: [id number] Date of Birth: [birth year] Referring Provider: Peggye Pitt  Encounter Date: 04/02/2015      PT End of Session - 04/02/15 1728    Visit Number 1   Number of Visits 12   Date for PT Re-Evaluation 05/14/15   PT Start Time 0430   PT Stop Time 0515   PT Time Calculation (min) 45 min   Activity Tolerance Patient tolerated treatment well   Behavior During Therapy [facility] for tasks assessed/performed      Past Medical History  Diagnosis Date  . Arthritis   . PONV (postoperative nausea and vomiting)   . Diabetes mellitus without complication     NIDDM  . Asthma     uses albuterol inh prn  . Sleep apnea     sleep study 2013, didnt finish study, left early    Past Surgical History  Procedure Laterality Date  . Oophorectomy    . Neck fusion    . Right hand surgery    . Back surgery      cervical neck fusion 2002  . Wrist surgery      TFCC repair 2003  . Mass excision Left 09/02/2012    Procedure: MINOR EXCISION OF CYST LEFT RING FINGER A-3 PULLEY;  Surgeon: Cammie Sickle., MD;  Location: [place];  Service: Orthopedics;  Laterality: Left;    There were no vitals filed for this visit.  Visit Diagnosis:  Stiffness of ankle joint, left - Plan: PT plan of care cert/re-cert  Ankle weakness - Plan: PT plan of care cert/re-cert  Pain, heel, left - Plan: PT plan of care cert/re-cert  Difficulty walking - Plan: PT plan of care cert/re-cert      Subjective Assessment - 04/02/15 1638    Subjective She reports bone spurs  with bone fragments. No injury reported. She has  had injections x 3 spots and in Cam boot for last 11 weeks. No exercises   Limitations Walking;Sitting  She sits for work more now.    How long can you sit  comfortably? as needed   How long can you stand comfortably? 15 min   How long can you walk comfortably? 10 min   Patient Stated Goals Return to occasional pain with weight bearing   Currently in Pain? Yes   Pain Score 3    Pain Location Heel   Pain Orientation Left;Posterior  insertion of heel cord   Pain Descriptors / Indicators Throbbing;Aching   Pain Type Chronic pain   Pain Onset More than a month ago   Pain Frequency Constant   Aggravating Factors  Being on feet   Pain Relieving Factors REst , medication, elevation with ice   Multiple Pain Sites No            [facility] PT Assessment - 04/02/15 1644    Assessment   Medical Diagnosis Miranda Ross   Referring Provider Peggye Pitt   Onset Date/Surgical Date --  09/2014   Next MD Visit 04/10/15   Prior Therapy No   Precautions   Precaution Comments wear boot, rest   Required Braces or Orthoses Other Brace/Splint   Other Brace/Splint CAM boot   Restrictions   Weight Bearing Restrictions No   Balance Screen   Has the patient fallen in the past  6 months No   Has the patient had a decrease in activity level because of a fear of falling?  Yes  due to pain   Is the patient reluctant to leave their home because of a fear of falling?  No   Home Environment   Additional Comments 2 steps to get into posrch then home. elevator at work   Prior Function   Level of Independence Requires assistive device for independence   Cognition   Overall Cognitive Status Within Functional Limits for tasks assessed   ROM / Strength   AROM / PROM / Strength AROM;Strength   AROM   AROM Assessment Site Ankle   Right/Left Ankle Right;Left   Right Ankle Dorsiflexion 18   Right Ankle Plantar Flexion 55   Right Ankle Inversion 33   Right Ankle Eversion 30   Left Ankle Dorsiflexion 90   Left Ankle Plantar Flexion 45   Left Ankle Inversion 35   Left Ankle Eversion 28   Strength   Overall Strength Comments Normal bilaterally except  PF 3+/5 LT with pain   Flexibility   Soft Tissue Assessment /Muscle Length yes   Palpation   Palpation comment puffiness noted LT heel cord  insertion   Ambulation/Gait   Gait Comments No device decreased weight to Lt leg , LT leg abducted              STW to LT calf with [employer]. Fascial restrictions noted. Worked on these with slow stroking and osscilations             PT Education - 04/02/15 1727    Education provided Yes   Education Details POC, DF stretch   Person(s) Educated Patient   Methods Explanation;Demonstration;Tactile cues;Verbal cues   Comprehension Returned demonstration;Verbalized understanding          PT Short Term Goals - 04/02/15 1733    PT SHORT TERM GOAL #1   Title She will be independent with initial  HEP    Time 3   Period Weeks   Status New   PT SHORT TERM GOAL #2   Title Active DF improvedto 15 degrees or better   Time 3   Period Weeks   Status New   PT SHORT TERM GOAL #3   Title She will report pain eased 25% or more with walking at work and home   Time 3   Period Weeks   Status New           PT Long Term Goals - 04/02/15 1735    PT LONG TERM GOAL #1   Title She will be independent with all HEP issued as of last visit   Time 6   Period Weeks   Status New   PT LONG TERM GOAL #2   Title She will report pain becoming intermittant    Time 6   Period Weeks   Status New   PT LONG TERM GOAL #3   Title She will report pain decrease 50% or more in intensity.    Time 6   Period Weeks   Status New   PT LONG TERM GOAL #4   Title She will bbe able to walk at home with 50% less pain out of CAM boot.    Time 6   Period Weeks   Status New   PT LONG TERM GOAL #5   Title She will be able to give 4/5 resitance with pain reduced on MMT   Time 6  Period Weeks   Status New               Plan - 04/02/15 1729    Clinical Impression Statement Miranda Ross presents with chronic LT heel cord insertion pain and Fascial  restrictions in RT calf and decreased active DF LT ankle limiting her activity on her feet. She should improve with PT   Pt will benefit from skilled therapeutic intervention in order to improve on the following deficits Increased fascial restricitons;Pain;Decreased activity tolerance;Decreased range of motion;Decreased strength;Difficulty walking   Rehab Potential Good   PT Frequency 2x / week   PT Duration 6 weeks   PT Treatment/Interventions Iontophoresis 4mg /ml Dexamethasone;Cryotherapy;Moist Heat;Ultrasound;Contrast Bath;Therapeutic exercise;Taping;Patient/family education;Manual techniques;Dry needling;Passive range of motion   PT Next Visit Plan STW to calf. ROM, modalities    PT Home Exercise Plan Active DF    Consulted and Agree with Plan of Care Patient         Problem List Patient Active Problem List   Diagnosis Date Noted  . Hyperlipidemia 06/18/2013  . Hematuria 06/18/2013    Darrel Hoover PT 04/02/2015, 5:43 PM  [place] [facility] [address] [place], [place], [address] Phone: [phone]   Fax:  [phone]  Name: Miranda Ross MRN: [id number] Date of Birth: Dec 02, 1975

## 2015-04-03 ENCOUNTER — Ambulatory Visit: Payer: 59 | Admitting: Physical Therapy

## 2015-04-09 ENCOUNTER — Ambulatory Visit: Payer: 59 | Admitting: Physical Therapy

## 2015-04-09 DIAGNOSIS — M25672 Stiffness of left ankle, not elsewhere classified: Secondary | ICD-10-CM | POA: Diagnosis not present

## 2015-04-09 DIAGNOSIS — R29898 Other symptoms and signs involving the musculoskeletal system: Secondary | ICD-10-CM

## 2015-04-09 DIAGNOSIS — M79672 Pain in left foot: Secondary | ICD-10-CM

## 2015-04-09 DIAGNOSIS — R262 Difficulty in walking, not elsewhere classified: Secondary | ICD-10-CM

## 2015-04-09 NOTE — Therapy (Signed)
[place], [place], [address] Phone: [phone]   Fax:  [phone]  Physical Therapy Treatment  Patient Details  Name: Miranda Ross MRN: [id number] Date of Birth: May 08, 1976 Referring Provider: Peggye Pitt  Encounter Date: 04/09/2015      PT End of Session - 04/09/15 1839    Visit Number 2   Number of Visits 12   Date for PT Re-Evaluation 05/14/15   PT Start Time [id number]   PT Stop Time 1458   PT Time Calculation (min) 42 min   Activity Tolerance Patient tolerated treatment well   Behavior During Therapy [facility] for tasks assessed/performed      Past Medical History  Diagnosis Date  . Arthritis   . PONV (postoperative nausea and vomiting)   . Diabetes mellitus without complication     NIDDM  . Asthma     uses albuterol inh prn  . Sleep apnea     sleep study 2013, didnt finish study, left early    Past Surgical History  Procedure Laterality Date  . Oophorectomy    . Neck fusion    . Right hand surgery    . Back surgery      cervical neck fusion 2002  . Wrist surgery      TFCC repair 2003  . Mass excision Left 09/02/2012    Procedure: MINOR EXCISION OF CYST LEFT RING FINGER A-3 PULLEY;  Surgeon: Cammie Sickle., MD;  Location: [place];  Service: Orthopedics;  Laterality: Left;    There were no vitals filed for this visit.  Visit Diagnosis:  Stiffness of ankle joint, left  Ankle weakness  Pain, heel, left  Difficulty walking      Subjective Assessment - 04/09/15 1830    Subjective Busy weekend with Thanksgiving and now back to work. Has appointment with doctor tomorrow, hoping to get out of the boot.    Limitations Walking;Standing   How long can you sit comfortably? as needed   How long can you stand comfortably? 15 min   How long can you walk comfortably? 10 min   Currently in Pain? Yes   Pain Score 6    Pain Location Heel   Pain Orientation  Left;Posterior   Pain Descriptors / Indicators Aching   Aggravating Factors  walking   Pain Relieving Factors rest, ice                         [facility] Adult PT Treatment/Exercise - 04/09/15 0001    Exercises   Exercises Ankle   Iontophoresis   Type of Iontophoresis Dexamethasone   Location Lt achilles   Dose 4mg /ml   Time 4 hour   Manual Therapy   Manual Therapy Soft tissue mobilization   Manual therapy comments to Lt calf musculature, trigger point release as needed.   Ankle Exercises: Stretches   Gastroc Stretch 5 reps;30 seconds   Ankle Exercises: Supine   Isometrics isometric inversion, eversion, dorsiflexion, plantar flexion. Each 1X10 moderate resistance.                 PT Education - 04/09/15 1838    Education provided Yes   Education Details HEP   Person(s) Educated Patient   Methods Explanation;Demonstration;Tactile cues;Verbal cues   Comprehension Returned demonstration;Verbalized understanding          PT Short Term Goals - 04/02/15 1733    PT SHORT TERM GOAL #1  Title She will be independent with initial  HEP    Time 3   Period Weeks   Status New   PT SHORT TERM GOAL #2   Title Active DF improvedto 15 degrees or better   Time 3   Period Weeks   Status New   PT SHORT TERM GOAL #3   Title She will report pain eased 25% or more with walking at work and home   Time 3   Period Weeks   Status New           PT Long Term Goals - 04/02/15 1735    PT LONG TERM GOAL #1   Title She will be independent with all HEP issued as of last visit   Time 6   Period Weeks   Status New   PT LONG TERM GOAL #2   Title She will report pain becoming intermittant    Time 6   Period Weeks   Status New   PT LONG TERM GOAL #3   Title She will report pain decrease 50% or more in intensity.    Time 6   Period Weeks   Status New   PT LONG TERM GOAL #4   Title She will bbe able to walk at home with 50% less pain out of CAM boot.    Time 6    Period Weeks   Status New   PT LONG TERM GOAL #5   Title She will be able to give 4/5 resitance with pain reduced on MMT   Time 6   Period Weeks   Status New               Plan - 04/09/15 1840    Clinical Impression Statement Able to start HEP with gentle isometrics throuht the Lt ankle. Patient has significant decreased strength throughout and should continue to benefit from further session to progress as tolerated. Patent reporting limited activity due to pain with standing and walking.    PT Next Visit Plan Assess HEP and modify as tolerated and benefit of ionto.    PT Home Exercise Plan Progress ankle strengthening as tolerated, possible band resistance if needed. Modalities as needed.    Consulted and Agree with Plan of Care Patient        Problem List Patient Active Problem List   Diagnosis Date Noted  . Hyperlipidemia 06/18/2013  . Hematuria 06/18/2013    Linard Millers, PT, CSCS 04/09/2015, 6:49 PM  [facility] [address] [place], [place], [address] Phone: [phone]   Fax:  [phone]  Name: Miranda Ross MRN: [id number] Date of Birth: [birth year]

## 2015-04-09 NOTE — Patient Instructions (Signed)
Isometric ankle: Inversion Eversion Dorsiflexion Plantarflexion  Each exercise performed by patient in clinic, declined handout for reference.  Each exercise 1 X10, moderate resistance, caution with plantarflexion.

## 2015-04-12 ENCOUNTER — Ambulatory Visit: Payer: 59 | Attending: Podiatry | Admitting: Physical Therapy

## 2015-04-12 DIAGNOSIS — M259 Joint disorder, unspecified: Secondary | ICD-10-CM | POA: Insufficient documentation

## 2015-04-12 DIAGNOSIS — M79672 Pain in left foot: Secondary | ICD-10-CM | POA: Insufficient documentation

## 2015-04-12 DIAGNOSIS — M25672 Stiffness of left ankle, not elsewhere classified: Secondary | ICD-10-CM | POA: Diagnosis present

## 2015-04-12 DIAGNOSIS — R29898 Other symptoms and signs involving the musculoskeletal system: Secondary | ICD-10-CM

## 2015-04-12 DIAGNOSIS — R262 Difficulty in walking, not elsewhere classified: Secondary | ICD-10-CM | POA: Diagnosis present

## 2015-04-12 NOTE — Therapy (Signed)
[place], [place], [address] Phone: [phone]   Fax:  [phone]  Physical Therapy Treatment  Patient Details  Name: Miranda Ross MRN: [id number] Date of Birth: [birth year] Referring Provider: Peggye Pitt  Encounter Date: 04/12/2015      PT End of Session - 04/12/15 0845    Visit Number 3   Number of Visits 12   Date for PT Re-Evaluation 05/14/15   PT Start Time 0803   PT Stop Time 0844   PT Time Calculation (min) 41 min   Activity Tolerance Patient tolerated treatment well   Behavior During Therapy [facility] for tasks assessed/performed      Past Medical History  Diagnosis Date  . Arthritis   . PONV (postoperative nausea and vomiting)   . Diabetes mellitus without complication     NIDDM  . Asthma     uses albuterol inh prn  . Sleep apnea     sleep study 2013, didnt finish study, left early    Past Surgical History  Procedure Laterality Date  . Oophorectomy    . Neck fusion    . Right hand surgery    . Back surgery      cervical neck fusion 2002  . Wrist surgery      TFCC repair 2003  . Mass excision Left 09/02/2012    Procedure: MINOR EXCISION OF CYST LEFT RING FINGER A-3 PULLEY;  Surgeon: Cammie Sickle., MD;  Location: [place];  Service: Orthopedics;  Laterality: Left;    There were no vitals filed for this visit.  Visit Diagnosis:  Stiffness of ankle joint, left  Ankle weakness  Pain, heel, left  Difficulty walking      Subjective Assessment - 04/12/15 0807    Subjective Cleared by physician to keep boot off up to 50% of the day. Will have next f/u in January.   Currently in Pain? No/denies                         [facility] Adult PT Treatment/Exercise - 04/12/15 0001    Iontophoresis   Type of Iontophoresis Dexamethasone   Location Lt achilles   Dose 4mg /ml   Time 4 hour   Manual Therapy   Manual Therapy Soft tissue  mobilization   Manual therapy comments to Lt calf musculature, trigger point release as needed.   Ankle Exercises: Stretches   Gastroc Stretch 5 reps;60 seconds;Other (comment)  to gentle stretch   Ankle Exercises: Supine   Isometrics isometric inversion, eversion, dorsiflexion, plantar flexion. Each 1X10 moderate resistance.    T-Band grade 1 band - dorsiflexion, plantarflexion, eversion, inversion- each 1X10                PT Education - 04/12/15 0845    Education provided Yes   Education Details activity progression with weaning from boot   Person(s) Educated Patient   Methods Explanation   Comprehension Verbalized understanding          PT Short Term Goals - 04/02/15 1733    PT SHORT TERM GOAL #1   Title She will be independent with initial  HEP    Time 3   Period Weeks   Status New   PT SHORT TERM GOAL #2   Title Active DF improvedto 15 degrees or better   Time 3   Period Weeks   Status New   PT SHORT TERM GOAL #3  Title She will report pain eased 25% or more with walking at work and home   Time 3   Period Weeks   Status New           PT Long Term Goals - 04/02/15 1735    PT LONG TERM GOAL #1   Title She will be independent with all HEP issued as of last visit   Time 6   Period Weeks   Status New   PT LONG TERM GOAL #2   Title She will report pain becoming intermittant    Time 6   Period Weeks   Status New   PT LONG TERM GOAL #3   Title She will report pain decrease 50% or more in intensity.    Time 6   Period Weeks   Status New   PT LONG TERM GOAL #4   Title She will bbe able to walk at home with 50% less pain out of CAM boot.    Time 6   Period Weeks   Status New   PT LONG TERM GOAL #5   Title She will be able to give 4/5 resitance with pain reduced on MMT   Time 6   Period Weeks   Status New               Plan - 04/12/15 0847    Clinical Impression Statement Patient reporting improvement after sessions, starting to wean  from boot. Multiple triggerpoints noted in calf musculature, improved by end of session. Attempting to progress strengthening through ankle complex for incresed ambulation tolerance and stability.    PT Next Visit Plan Progress HEP with grade 1 band df/pf/ev/iv, review self dorsiflexion stretch. Assess  benefit of ionto   PT Home Exercise Plan band ex as above and dorsiflexion stretch.    Consulted and Agree with Plan of Care Patient        Problem List Patient Active Problem List   Diagnosis Date Noted  . Hyperlipidemia 06/18/2013  . Hematuria 06/18/2013    Linard Millers, PT 04/12/2015, 8:52 AM  [facility] [address] [place], [place], [address] Phone: [phone]   Fax:  [phone]  Name: Miranda Ross MRN: [id number] Date of Birth: 1976/05/03

## 2015-04-16 ENCOUNTER — Ambulatory Visit: Payer: 59 | Admitting: Physical Therapy

## 2015-04-16 DIAGNOSIS — M25672 Stiffness of left ankle, not elsewhere classified: Secondary | ICD-10-CM

## 2015-04-16 DIAGNOSIS — R262 Difficulty in walking, not elsewhere classified: Secondary | ICD-10-CM

## 2015-04-16 DIAGNOSIS — R29898 Other symptoms and signs involving the musculoskeletal system: Secondary | ICD-10-CM

## 2015-04-16 DIAGNOSIS — M79672 Pain in left foot: Secondary | ICD-10-CM

## 2015-04-16 NOTE — Patient Instructions (Signed)
Patient declining handout 4 way grade 2 band resisted ankle strengthening exs. Df/pf/ev/iv

## 2015-04-16 NOTE — Therapy (Signed)
[place], [place], [address] Phone: [phone]   Fax:  [phone]  Physical Therapy Treatment  Patient Details  Name: Miranda Ross MRN: [id number] Date of Birth: 1976-03-06 Referring Provider: Peggye Pitt  Encounter Date: 04/16/2015      PT End of Session - 04/16/15 1631    Visit Number 4   Number of Visits 12   Date for PT Re-Evaluation 05/14/15   PT Start Time [id number]   PT Stop Time 1626   PT Time Calculation (min) 39 min   Activity Tolerance Patient tolerated treatment well   Behavior During Therapy [facility] for tasks assessed/performed      Past Medical History  Diagnosis Date  . Arthritis   . PONV (postoperative nausea and vomiting)   . Diabetes mellitus without complication     NIDDM  . Asthma     uses albuterol inh prn  . Sleep apnea     sleep study 2013, didnt finish study, left early    Past Surgical History  Procedure Laterality Date  . Oophorectomy    . Neck fusion    . Right hand surgery    . Back surgery      cervical neck fusion 2002  . Wrist surgery      TFCC repair 2003  . Mass excision Left 09/02/2012    Procedure: MINOR EXCISION OF CYST LEFT RING FINGER A-3 PULLEY;  Surgeon: Cammie Sickle., MD;  Location: [place];  Service: Orthopedics;  Laterality: Left;    There were no vitals filed for this visit.  Visit Diagnosis:  Stiffness of ankle joint, left  Ankle weakness  Pain, heel, left  Difficulty walking      Subjective Assessment - 04/16/15 1551    Subjective Doing okay, exercises seem to be helping. Ankle feels stable with walking around as long as the shoes are good. Wearing the boot about 1/2 day now.    Currently in Pain? Yes   Pain Score 4    Pain Location Heel   Pain Orientation Left   Pain Descriptors / Indicators Dull   Aggravating Factors  walking, standing (standing worse)   Pain Relieving Factors rest                          [facility] Adult PT Treatment/Exercise - 04/16/15 0001    Manual Therapy   Manual Therapy Soft tissue mobilization   Manual therapy comments to Lt calf musculature, trigger point release as needed.   Ankle Exercises: Stretches   Gastroc Stretch 5 reps;30 seconds;Other (comment)  manual stretch with knee extension   Ankle Exercises: Supine   Isometrics isometric inversion, eversion, dorsiflexion, plantar flexion. Each 1X10 moderate + resistance.    T-Band grade 2 band - dorsiflexion, plantarflexion, eversion, inversion- each 1X10                PT Education - 04/16/15 1630    Education provided Yes   Education Details Extensive time spent on education on HEP with band for resistance. Patient denies questions or concens after session.    Person(s) Educated Patient   Methods Explanation;Demonstration;Tactile cues;Verbal cues   Comprehension Verbalized understanding;Returned demonstration          PT Short Term Goals - 04/02/15 1733    PT SHORT TERM GOAL #1   Title She will be independent with initial  HEP    Time 3  Period Weeks   Status New   PT SHORT TERM GOAL #2   Title Active DF improvedto 15 degrees or better   Time 3   Period Weeks   Status New   PT SHORT TERM GOAL #3   Title She will report pain eased 25% or more with walking at work and home   Time 3   Period Weeks   Status New           PT Long Term Goals - 04/02/15 1735    PT LONG TERM GOAL #1   Title She will be independent with all HEP issued as of last visit   Time 6   Period Weeks   Status New   PT LONG TERM GOAL #2   Title She will report pain becoming intermittant    Time 6   Period Weeks   Status New   PT LONG TERM GOAL #3   Title She will report pain decrease 50% or more in intensity.    Time 6   Period Weeks   Status New   PT LONG TERM GOAL #4   Title She will bbe able to walk at home with 50% less pain out of CAM boot.    Time 6   Period  Weeks   Status New   PT LONG TERM GOAL #5   Title She will be able to give 4/5 resitance with pain reduced on MMT   Time 6   Period Weeks   Status New               Plan - 04/16/15 1632    Clinical Impression Statement Patient reports that she is doing much better, able to walk  better and is now only using boot 50% of the day. Lt ankle remains weak and with limited dorsiflexion. Patient remains appropriate for continued PT sessions to work on Lt ankle ROM and stabilization.    PT Next Visit Plan BAPS board, standing exercises (step ups, balance activities with UE support)   PT Home Exercise Plan continue with band exs, may add stepup or balance exs as tolerated.    Consulted and Agree with Plan of Care Patient        Problem List Patient Active Problem List   Diagnosis Date Noted  . Hyperlipidemia 06/18/2013  . Hematuria 06/18/2013    Linard Millers, PT 04/16/2015, 4:36 PM  [facility] [address] [place], [place], [address] Phone: [phone]   Fax:  [phone]  Name: Miranda Ross MRN: [id number] Date of Birth: 1975/08/19

## 2015-04-18 ENCOUNTER — Ambulatory Visit: Payer: 59

## 2015-04-23 ENCOUNTER — Ambulatory Visit: Payer: 59 | Admitting: Physical Therapy

## 2015-04-23 DIAGNOSIS — M25672 Stiffness of left ankle, not elsewhere classified: Secondary | ICD-10-CM | POA: Diagnosis not present

## 2015-04-23 DIAGNOSIS — M79672 Pain in left foot: Secondary | ICD-10-CM

## 2015-04-23 DIAGNOSIS — R262 Difficulty in walking, not elsewhere classified: Secondary | ICD-10-CM

## 2015-04-23 DIAGNOSIS — R29898 Other symptoms and signs involving the musculoskeletal system: Secondary | ICD-10-CM

## 2015-04-23 NOTE — Therapy (Signed)
[place], [place], [address] Phone: [phone]   Fax:  [phone]  Physical Therapy Treatment  Patient Details  Name: Miranda Ross MRN: [id number] Date of Birth: 10-08-1975 Referring Provider: Peggye Pitt  Encounter Date: 04/23/2015      PT End of Session - 04/23/15 1837    Visit Number 5   Number of Visits 12   Date for PT Re-Evaluation 05/14/15   PT Start Time [id number]   PT Stop Time 1545   PT Time Calculation (min) 38 min   Activity Tolerance Patient tolerated treatment well;No increased pain   Behavior During Therapy [facility] for tasks assessed/performed      Past Medical History  Diagnosis Date  . Arthritis   . PONV (postoperative nausea and vomiting)   . Diabetes mellitus without complication     NIDDM  . Asthma     uses albuterol inh prn  . Sleep apnea     sleep study 2013, didnt finish study, left early    Past Surgical History  Procedure Laterality Date  . Oophorectomy    . Neck fusion    . Right hand surgery    . Back surgery      cervical neck fusion 2002  . Wrist surgery      TFCC repair 2003  . Mass excision Left 09/02/2012    Procedure: MINOR EXCISION OF CYST LEFT RING FINGER A-3 PULLEY;  Surgeon: Cammie Sickle., MD;  Location: [place];  Service: Orthopedics;  Laterality: Left;    There were no vitals filed for this visit.  Visit Diagnosis:  Stiffness of ankle joint, left  Ankle weakness  Pain, heel, left  Difficulty walking      Subjective Assessment - 04/23/15 1547    Subjective Ankle has been hurting more than is was, not sure why. Really started to notice it on Thursday and Friday. Wearing the boot 1/2 the day still.   Currently in Pain? Yes   Pain Score 5    Pain Location Heel   Pain Orientation Left   Pain Descriptors / Indicators Tender   Pain Type Chronic pain   Aggravating Factors  walking   Pain Relieving Factors boot,  elevation, ice                         [facility] Adult PT Treatment/Exercise - 04/23/15 0001    Ankle Exercises: Seated   Ankle Circles/Pumps AAROM;Left;20 reps   Ankle Exercises: Stretches   Gastroc Stretch 5 reps;30 seconds;Other (comment)  manual stretch with knee extension   Ankle Exercises: Supine   Isometrics isometric inversion, eversion, dorsiflexion, plantar flexion. Each 1X10 moderate + resistance.    T-Band grade 1 band - dorsiflexion, plantarflexion, eversion, inversion- each 1X10  modified due to patient reports of pain   Ankle Exercises: Standing   BAPS Level 2;Sitting;Other (comment)  A/P, Lt/Rt, Clockwise/counter                PT Education - 04/23/15 1837    Education provided Yes   Education Details modfied HEP to grade 1 band due to recent increased pain.    Person(s) Educated Patient   Methods Explanation;Demonstration   Comprehension Verbalized understanding;Returned demonstration          PT Short Term Goals - 04/02/15 1733    PT SHORT TERM GOAL #1   Title She will be independent with initial  HEP  Time 3   Period Weeks   Status New   PT SHORT TERM GOAL #2   Title Active DF improvedto 15 degrees or better   Time 3   Period Weeks   Status New   PT SHORT TERM GOAL #3   Title She will report pain eased 25% or more with walking at work and home   Time 3   Period Weeks   Status New           PT Long Term Goals - 04/02/15 1735    PT LONG TERM GOAL #1   Title She will be independent with all HEP issued as of last visit   Time 6   Period Weeks   Status New   PT LONG TERM GOAL #2   Title She will report pain becoming intermittant    Time 6   Period Weeks   Status New   PT LONG TERM GOAL #3   Title She will report pain decrease 50% or more in intensity.    Time 6   Period Weeks   Status New   PT LONG TERM GOAL #4   Title She will bbe able to walk at home with 50% less pain out of CAM boot.    Time 6   Period  Weeks   Status New   PT LONG TERM GOAL #5   Title She will be able to give 4/5 resitance with pain reduced on MMT   Time 6   Period Weeks   Status New               Plan - 04/23/15 1838    Clinical Impression Statement Patient making gradual progress, wearing boot 1/2 day. Patient does reports some incresed discomfort at medial and lateral ankle, possibly related to start of band resisted HEP. Modified HEP to decreased resistance, patient tolerated well during session. Patient denies questions or concerns during or after session. Will continue to work on strength,stability and flexibility.    PT Next Visit Plan BAPS board, standing exercises (step ups, balance activities with UE support)   PT Home Exercise Plan review and progress bands if able.    Consulted and Agree with Plan of Care Patient        Problem List Patient Active Problem List   Diagnosis Date Noted  . Hyperlipidemia 06/18/2013  . Hematuria 06/18/2013    Linard Millers, PT 04/23/2015, 6:41 PM  [facility] [address] [place], [place], [address] Phone: [phone]   Fax:  [phone]  Name: Miranda Ross MRN: [id number] Date of Birth: January 20, 1976

## 2015-04-25 ENCOUNTER — Ambulatory Visit: Payer: 59 | Admitting: Physical Therapy

## 2015-04-25 DIAGNOSIS — R262 Difficulty in walking, not elsewhere classified: Secondary | ICD-10-CM

## 2015-04-25 DIAGNOSIS — R29898 Other symptoms and signs involving the musculoskeletal system: Secondary | ICD-10-CM

## 2015-04-25 DIAGNOSIS — M79672 Pain in left foot: Secondary | ICD-10-CM

## 2015-04-25 DIAGNOSIS — M25672 Stiffness of left ankle, not elsewhere classified: Secondary | ICD-10-CM | POA: Diagnosis not present

## 2015-04-25 NOTE — Therapy (Signed)
[place], [place], [address] Phone: [phone]   Fax:  [phone]  Physical Therapy Treatment  Patient Details  Name: ARIAS Ross MRN: [id number] Date of Birth: Apr 21, 1976 Referring Provider: Peggye Ross  Encounter Date: 04/25/2015      PT End of Session - 04/25/15 1716    Visit Number 6   Number of Visits 12   Date for PT Re-Evaluation 05/14/15   PT Start Time 1633   PT Stop Time 1713   PT Time Calculation (min) 40 min   Activity Tolerance Patient tolerated treatment well   Behavior During Therapy [facility] for tasks assessed/performed      Past Medical History  Diagnosis Date  . Arthritis   . PONV (postoperative nausea and vomiting)   . Diabetes mellitus without complication     NIDDM  . Asthma     uses albuterol inh prn  . Sleep apnea     sleep study 2013, didnt finish study, left early    Past Surgical History  Procedure Laterality Date  . Oophorectomy    . Neck fusion    . Right hand surgery    . Back surgery      cervical neck fusion 2002  . Wrist surgery      TFCC repair 2003  . Mass excision Left 09/02/2012    Procedure: MINOR EXCISION OF CYST LEFT RING FINGER A-3 PULLEY;  Surgeon: Miranda Ross., MD;  Location: [place];  Service: Orthopedics;  Laterality: Left;    There were no vitals filed for this visit.  Visit Diagnosis:  Stiffness of ankle joint, left  Ankle weakness  Pain, heel, left  Difficulty walking      Subjective Assessment - 04/25/15 1633    Subjective Doing a little better today, the band change seemed to help.    Currently in Pain? Yes   Pain Score 3    Pain Location Heel   Pain Orientation Left   Pain Descriptors / Indicators Throbbing;Dull   Aggravating Factors  walking   Pain Relieving Factors not walking or standing, ice elevation                         [facility] Adult PT Treatment/Exercise - 04/25/15  0001    Manual Therapy   Manual Therapy Soft tissue mobilization   Manual therapy comments to Lt calf musculature, trigger point release as needed.   Ankle Exercises: Clinical research associate 5 reps;Other (comment);60 seconds  manual stretch with knee extension   Ankle Exercises: Supine   Isometrics isometric inversion, eversion, dorsiflexion, plantar flexion. Each 1X10 moderate + resistance.    Other Supine Ankle Exercises inversion, eversion, dorsiflexion, plantarflexion - grade 3 band 1X10   Ankle Exercises: Standing   BAPS Level 2;Sitting;Other (comment);Level 3  A/P, Lt/Rt, Clockwise/counter, 1 cycle L2, 1 cycle L3                PT Education - 04/25/15 1715    Education provided Yes   Education Details progress back to L2 band as able   Person(s) Educated Patient   Methods Explanation   Comprehension Verbalized understanding          PT Short Term Goals - 04/25/15 1721    PT SHORT TERM GOAL #1   Title She will be independent with initial  HEP    Time 3   Period Weeks   Status  Achieved   PT SHORT TERM GOAL #2   Title Active DF improvedto 15 degrees or better   Baseline improving, not measured   Time 3   Period Weeks   Status On-going   PT SHORT TERM GOAL #3   Title She will report pain eased 25% or more with walking at work and home   Baseline variable overall decreased pain   Time 3   Status On-going           PT Long Term Goals - 04/02/15 1735    PT LONG TERM GOAL #1   Title She will be independent with all HEP issued as of last visit   Time 6   Period Weeks   Status New   PT LONG TERM GOAL #2   Title She will report pain becoming intermittant    Time 6   Period Weeks   Status New   PT LONG TERM GOAL #3   Title She will report pain decrease 50% or more in intensity.    Time 6   Period Weeks   Status New   PT LONG TERM GOAL #4   Title She will bbe able to walk at home with 50% less pain out of CAM boot.    Time 6   Period Weeks    Status New   PT LONG TERM GOAL #5   Title She will be able to give 4/5 resitance with pain reduced on MMT   Time 6   Period Weeks   Status New               Plan - 04/25/15 1717    Clinical Impression Statement Patient reports not using her boot at all today but wasn't at work. Pain more localized now and able to tolerate more activity with less pain. Will continue to advace with strengthening and ROM as tolerated. gradually increasing activity at tolerated for improved gait and functional mobility tolerance.    PT Next Visit Plan Assess progress, trial balance activities for functional strengthening.    PT Home Exercise Plan HEP with grade 2-3 band, standing exercises as tolerated.    Consulted and Agree with Plan of Care Patient        Problem List Patient Active Problem List   Diagnosis Date Noted  . Hyperlipidemia 06/18/2013  . Hematuria 06/18/2013    Linard Millers, PT 04/25/2015, 5:29 PM  [facility] [address] [place], [place], [address] Phone: [phone]   Fax:  [phone]  Name: Miranda Ross MRN: [id number] Date of Birth: [birth year]

## 2015-05-01 ENCOUNTER — Encounter: Payer: 59 | Admitting: Physical Therapy

## 2015-05-09 ENCOUNTER — Ambulatory Visit: Payer: 59

## 2015-05-15 ENCOUNTER — Ambulatory Visit: Payer: 59

## 2015-05-16 ENCOUNTER — Ambulatory Visit: Payer: 59 | Attending: Podiatry | Admitting: Physical Therapy

## 2015-05-16 DIAGNOSIS — M259 Joint disorder, unspecified: Secondary | ICD-10-CM | POA: Insufficient documentation

## 2015-05-16 DIAGNOSIS — M25672 Stiffness of left ankle, not elsewhere classified: Secondary | ICD-10-CM | POA: Insufficient documentation

## 2015-05-16 DIAGNOSIS — M79672 Pain in left foot: Secondary | ICD-10-CM | POA: Diagnosis present

## 2015-05-16 DIAGNOSIS — R262 Difficulty in walking, not elsewhere classified: Secondary | ICD-10-CM | POA: Insufficient documentation

## 2015-05-16 DIAGNOSIS — R29898 Other symptoms and signs involving the musculoskeletal system: Secondary | ICD-10-CM

## 2015-05-16 NOTE — Therapy (Addendum)
[facility] [address] [place], [place], [address] Phone: [phone]   Fax:  [phone]  Physical Therapy Treatment/Discharge  Patient Details  Name: Miranda Ross MRN: [id number] Date of Birth: 03/14/1976 Referring Provider: Lillie Fragmin  Encounter Date: 05/16/2015      PT End of Session - 05/16/15 1518    Visit Number 7   Number of Visits 12   PT Start Time 1435   PT Stop Time 1513   PT Time Calculation (min) 38 min   Activity Tolerance Patient tolerated treatment well   Behavior During Therapy [facility] for tasks assessed/performed      Past Medical History  Diagnosis Date  . Arthritis   . PONV (postoperative nausea and vomiting)   . Diabetes mellitus without complication     NIDDM  . Asthma     uses albuterol inh prn  . Sleep apnea     sleep study 2013, didnt finish study, left early    Past Surgical History  Procedure Laterality Date  . Oophorectomy    . Neck fusion    . Right hand surgery    . Back surgery      cervical neck fusion 2002  . Wrist surgery      TFCC repair 2003  . Mass excision Left 09/02/2012    Procedure: MINOR EXCISION OF CYST LEFT RING FINGER A-3 PULLEY;  Surgeon: Wyn Forster., MD;  Location: [facility];  Service: Orthopedics;  Laterality: Left;    There were no vitals filed for this visit.  Visit Diagnosis:  Stiffness of ankle joint, left  Ankle weakness  Pain, heel, left  Difficulty walking      Subjective Assessment - 05/16/15 1439    Subjective MD says to stop PT after today.  Sees him in 6 weeks.  It popped yesterday.  US done which showed a divet. Calf feels like its cramping.    How long can you stand comfortably? 15 min "fire"    How long can you walk comfortably? 10-15   Currently in Pain? Yes   Pain Score 4    Pain Location Heel   Pain Orientation Left   Pain Descriptors / Indicators Burning   Pain Type Chronic pain   Pain Radiating  Towards heel to calf   Pain Onset More than a month ago   Pain Frequency Constant            [facility] PT Assessment - 05/16/15 1447    AROM   Left Ankle Dorsiflexion 12   Left Ankle Plantar Flexion 40   Left Ankle Inversion 34   Left Ankle Eversion 15   Strength   Overall Strength Comments DF 5/5, PF 5/5 (NWB), EV 4+/5 and INV 5/5      Self care: RICE, HEP review and progression, POC, stretch          [facility] Adult PT Treatment/Exercise - 05/16/15 1522    Manual Therapy   Manual Therapy Soft tissue mobilization;Myofascial release;Passive ROM   Manual therapy comments to Lt calf musculature, trigger point release as needed.   Soft tissue mobilization gastrocsoleus and into medial aspect of ankle  trigger points, stripping    Myofascial Release calf   Passive ROM DF/PF    Ankle Exercises: Stretches   Soleus Stretch 2 reps;30 seconds   Gastroc Stretch 2 reps;30 seconds   Ankle Exercises: Seated   Ankle Circles/Pumps AAROM;Left;20 reps   Other Seated Ankle Exercises PF, asked  to try in standing when pain is less, ex progression                PT Education - 05/16/15 1518    Education provided Yes   Education Details HEP, DC and standing calf stretch   Person(s) Educated Patient   Methods Explanation;Demonstration;Handout   Comprehension Verbalized understanding          PT Short Term Goals - 05/16/15 1442    PT SHORT TERM GOAL #1   Title She will be independent with initial  HEP    Status Achieved   PT SHORT TERM GOAL #2   Title Active DF improvedto 15 degrees or better   PT SHORT TERM GOAL #3   Title She will report pain eased 25% or more with walking at work and home   Status Achieved           PT Long Term Goals - 05/16/15 1443    PT [place] #1   Title She will be independent with all HEP issued as of last visit   Status Achieved   PT LONG TERM GOAL #2   Title She will report pain becoming intermittant    Status Partially Met   PT  LONG TERM GOAL #3   Title She will report pain decrease 50% or more in intensity.    Status Achieved   PT LONG TERM GOAL #4   Title She will bbe able to walk at home with 50% less pain out of CAM boot.    Status Achieved   PT LONG TERM GOAL #5   Title She will be able to give 4/5 resitance with pain reduced on MMT   Status Achieved               Plan - 05/16/15 1519    Clinical Impression Statement Reports 50% improvement in pain and function.  Cont to have difficulty with longer periods of weightbearing.  Will place on hold and she will contact us after 6 week visit with Dr. Magnus Ivan.    PT Next Visit Plan HOLD, re-assess?   PT Home Exercise Plan HEP with grade 2-3 band, standing exercises as tolerated. gave gastroc/soleus wall    Consulted and Agree with Plan of Care Patient        Problem List Patient Active Problem List   Diagnosis Date Noted  . Hyperlipidemia 06/18/2013  . Hematuria 06/18/2013    PAA,JENNIFER 05/16/2015, 3:26 PM  [facility] [address] [place], [place], [address] Phone: [phone]   Fax:  [phone]  Name: Miranda Ross MRN: [id number] Date of Birth: 01-27-1976    Raeford Razor, PT 05/16/2015 3:28 PM Phone: [phone] Fax: [phone]   PHYSICAL THERAPY DISCHARGE SUMMARY  Visits from Start of Care: 7  Current functional level related to goals / functional outcomes: Unknown, see above   Remaining deficits: Unknown   Education / Equipment: RICE, HEP Plan: Patient agrees to discharge.  Patient goals were met. Patient is being discharged due to not returning since the last visit.  ?????    Goals met, ot was on hold but never returned.   Raeford Razor, PT 08/02/2015 2:48 PM Phone: [phone] Fax: [phone]

## 2016-03-10 NOTE — Progress Notes (Signed)
Already addressed  Amy, i've already addressed this on 08/09/2014. Please see SRS for full details. i spoke w/ pt and explained cxr = negative ( labs showed Increased ACE).

## 2016-07-24 DIAGNOSIS — Z8719 Personal history of other diseases of the digestive system: Secondary | ICD-10-CM | POA: Insufficient documentation

## 2016-07-24 DIAGNOSIS — M7061 Trochanteric bursitis, right hip: Secondary | ICD-10-CM | POA: Insufficient documentation

## 2016-07-24 DIAGNOSIS — M7062 Trochanteric bursitis, left hip: Secondary | ICD-10-CM

## 2016-07-24 DIAGNOSIS — M47812 Spondylosis without myelopathy or radiculopathy, cervical region: Secondary | ICD-10-CM | POA: Insufficient documentation

## 2016-07-24 DIAGNOSIS — R768 Other specified abnormal immunological findings in serum: Secondary | ICD-10-CM | POA: Insufficient documentation

## 2016-07-24 DIAGNOSIS — I7300 Raynaud's syndrome without gangrene: Secondary | ICD-10-CM | POA: Insufficient documentation

## 2016-07-24 DIAGNOSIS — M170 Bilateral primary osteoarthritis of knee: Secondary | ICD-10-CM | POA: Insufficient documentation

## 2016-07-24 NOTE — Progress Notes (Signed)
Office Visit Note  Patient: Miranda Ross             Date of Birth: [birth year]           MRN: [id number]             PCP: Delrae Rend, MD Referring: No ref. provider found Visit Date: 07/31/2016 Occupation: _0 @    Subjective:  Left hip and left shoulder pain   History of Present Illness: Miranda Ross is a 41 y.o. female with history of osteoarthritis and disc disease. According to patient she's been having discomfort in her left trochanteric area for the last 2 months which is gradually getting worse. She's been having discomfort in her left shoulder for the last 2 weeks. She has difficulty sleeping on the left side. She does have some ongoing discomfort in her knee joints. She's been having discomfort in her bilateral thumbs. And also some in the left trapezius area. Raynauds is a stable.   Activities of Daily Living:  Patient reports morning stiffness for 30 minutes.   Patient Reports nocturnal pain.  Difficulty dressing/grooming: Denies Difficulty climbing stairs: Reports Difficulty getting out of chair: Reports Difficulty using hands for taps, buttons, cutlery, and/or writing: Reports   Review of Systems  Constitutional: Positive for fatigue. Negative for night sweats, weight gain, weight loss and weakness.  HENT: Negative for mouth sores, trouble swallowing, trouble swallowing, mouth dryness and nose dryness.   Eyes: Negative for pain, redness, visual disturbance and dryness.  Respiratory: Negative for cough, shortness of breath and difficulty breathing.   Cardiovascular: Negative for chest pain, palpitations, hypertension, irregular heartbeat and swelling in legs/feet.  Gastrointestinal: Negative for blood in stool, constipation and diarrhea.  Endocrine: Negative for increased urination.  Genitourinary: Negative for vaginal dryness.  Musculoskeletal: Positive for arthralgias, joint pain and morning stiffness. Negative for joint swelling, myalgias, muscle  weakness, muscle tenderness and myalgias.  Skin: Positive for color change. Negative for rash, hair loss, skin tightness, ulcers and sensitivity to sunlight.  Allergic/Immunologic: Negative for susceptible to infections.  Neurological: Negative for dizziness, memory loss and night sweats.  Hematological: Negative for swollen glands.  Psychiatric/Behavioral: Negative for depressed mood and sleep disturbance. The patient is not nervous/anxious.     PMFS History:  Patient Active Problem List   Diagnosis Date Noted  . History of asthma 07/28/2016  . Raynaud's disease without gangrene 07/24/2016  . Primary osteoarthritis of both knees 07/24/2016  . DJD (degenerative joint disease), cervical 07/24/2016  . History of gastroesophageal reflux (GERD) 07/24/2016  . ANA positive 07/24/2016  . Trochanteric bursitis of both hips 07/24/2016  . Hyperlipidemia 06/18/2013  . Hematuria 06/18/2013    Past Medical History:  Diagnosis Date  . Arthritis   . Asthma    uses albuterol inh prn  . Diabetes mellitus without complication (HCC)    NIDDM  . PONV (postoperative nausea and vomiting)   . Sleep apnea    sleep study 2013, didnt finish study, left early    History reviewed. No pertinent family history. Past Surgical History:  Procedure Laterality Date  . BACK SURGERY     cervical neck fusion 2002  . MASS EXCISION Left 09/02/2012   Procedure: MINOR EXCISION OF CYST LEFT RING FINGER A-3 PULLEY;  Surgeon: Cammie Sickle., MD;  Location: [facility];  Service: Orthopedics;  Laterality: Left;  . neck fusion    . OOPHORECTOMY    . right hand surgery    . WRIST SURGERY  TFCC repair 2003   Social History   Social History Narrative  . No narrative on file     Objective: Vital Signs: BP (!) 167/92   Pulse 84   Resp 13   Ht _0  (1.6 m)   Wt (!) 337 lb (152.9 kg)   BMI 59.70 kg/m    Physical Exam  Constitutional: She is oriented to person, place, and time. She  appears well-developed and well-nourished.  HENT:  Head: Normocephalic and atraumatic.  Eyes: Conjunctivae and EOM are normal.  Neck: Normal range of motion.  Cardiovascular: Normal rate, regular rhythm, normal heart sounds and intact distal pulses.   Pulmonary/Chest: Effort normal and breath sounds normal.  Abdominal: Soft. Bowel sounds are normal.  Difficulty palpating due to body habitus  Lymphadenopathy:    She has no cervical adenopathy.  Neurological: She is alert and oriented to person, place, and time.  Skin: Skin is warm and dry. Capillary refill takes less than 2 seconds.  Psychiatric: She has a normal mood and affect. Her behavior is normal.  Nursing note and vitals reviewed.    Musculoskeletal Exam: C-spine and thoracic spine good range of motion. She is some discomfort range of motion of her lumbar spine. She has painful range of motion of her left shoulder joint. Elbow joints wrist joint MCPs PIPs with good range of motion with no synovitis. She has tenderness over bilateral trochanteric bursae especially on the left side hip joints are good range of motion with no synovitis.  CDAI Exam: No CDAI exam completed.    Investigation: Findings:  On November 25, 2009, CBC, sed rate, and rheumatoid factor were negative.  Her ANA was positive  In October of 2011, comprehensive metabolic panel showed glucose of 175.  Lupus anticoagulant, ANA, C3, C4, beta 2, anti-Cardiolipin, ENA, and CCP were all negative.   01/30/2016 ESR 47, hep panel negative, immunoglobulins normal, ANA negative, IFE: A poorly defined area of restricted protein mobility is distracted and is reactive with IgA and lambda antisera    Imaging: No results found.  Speciality Comments: No specialty comments available.    Procedures:  Large Joint Inj Date/Time: 07/31/2016 11:27 AM Performed by: Bo Merino Authorized by: Bo Merino   Consent Given by:  Patient Site marked: the procedure site was  marked   Timeout: prior to procedure the correct patient, procedure, and site was verified   Indications:  Pain Location:  Hip Site:  L greater trochanter Prep: patient was prepped and draped in usual sterile fashion   Needle Size:  27 G Needle Length:  1.5 inches Approach:  Lateral Ultrasound Guidance: No   Fluoroscopic Guidance: No   Arthrogram: No   Medications:  40 mg triamcinolone acetonide 40 MG/ML; 1.5 mL lidocaine 1 % Aspiration Attempted: No   Aspirate amount (mL):  0 Patient tolerance:  Patient tolerated the procedure well with no immediate complications   Allergies: Patient has no known allergies.   Assessment / Plan:     Visit Diagnoses: Raynaud's disease without gangrene: Not very active currently  ANA positive - She has had positive ANA without any titer in the past.  Has had autoimmune workup which was all negative. Her most recent ANAs have been negative. She has no clinical features of autoimmune diseases.  Trochanteric bursitis of both hips: She has severe pain and discomfort in her left hip. After different treatment options were discussed and informed consent was obtained left trochanteric area was prepped in sterile fashion and injected  with cortisone as described above. She tolerated the procedure well.  Primary osteoarthritis of both knees: Chronic pain  DJD (degenerative joint disease), cervical - Status post fusion 2002 by Dr. Ellene Route  History of gastroesophageal reflux (GERD): Discourage the use of anti-inflammatories.  Chronic pain: She was started last year on tramadol which she takes on when necessary basis. She states she is able to function much better with the tramadol. A prescription refill was given today.  Mixed hyperlipidemia  History of asthma  Patient has history of hypertension and diabetes:  I  advised her to monitor blood pressure and blood sugar closely specialty after cortisone injection. Abnormal laboratory test - Abnormal IFE -  Plan: Immunofixation (IFE), Serum, Ambulatory referral to Hematology  Medication monitoring encounter - Plan: CBC with Differential/Platelet, CMP14+EGFR  Vitamin D deficiency - Plan: VITAMIN D 25 Hydroxy (Vit-D Deficiency, Fractures)   Orders: Orders Placed This Encounter  Procedures  . Large Joint Injection/Arthrocentesis  . CBC with Differential/Platelet  . CMP14+EGFR  . Immunofixation (IFE), Serum  . VITAMIN D 25 Hydroxy (Vit-D Deficiency, Fractures)  . Ambulatory referral to Hematology   Meds ordered this encounter  Medications  . traMADol (ULTRAM) 50 MG tablet    Sig: Take 1 tablet (50 mg total) by mouth 2 (two) times daily as needed.    Dispense:  60 tablet    Refill:  2    Face-to-face time spent with patient was 30 minutes. 50% of time was spent in counseling and coordination of care.  Follow-Up Instructions: Return in about 6 months (around 01/31/2017) for Osteoarthritis,DDD.   Bo Merino, MD  Note - This record has been created using Editor, commissioning.  Chart creation errors have been sought, but may not always  have been located. Such creation errors do not reflect on  the standard of medical care.

## 2016-07-28 DIAGNOSIS — Z8709 Personal history of other diseases of the respiratory system: Secondary | ICD-10-CM | POA: Insufficient documentation

## 2016-07-31 ENCOUNTER — Ambulatory Visit (INDEPENDENT_AMBULATORY_CARE_PROVIDER_SITE_OTHER): Payer: 59 | Admitting: Rheumatology

## 2016-07-31 ENCOUNTER — Encounter: Payer: Self-pay | Admitting: Rheumatology

## 2016-07-31 VITALS — BP 167/92 | HR 84 | Resp 13 | Ht 63.0 in | Wt 337.0 lb

## 2016-07-31 DIAGNOSIS — R899 Unspecified abnormal finding in specimens from other organs, systems and tissues: Secondary | ICD-10-CM

## 2016-07-31 DIAGNOSIS — M47812 Spondylosis without myelopathy or radiculopathy, cervical region: Secondary | ICD-10-CM

## 2016-07-31 DIAGNOSIS — Z8709 Personal history of other diseases of the respiratory system: Secondary | ICD-10-CM | POA: Diagnosis not present

## 2016-07-31 DIAGNOSIS — Z8719 Personal history of other diseases of the digestive system: Secondary | ICD-10-CM | POA: Diagnosis not present

## 2016-07-31 DIAGNOSIS — M7062 Trochanteric bursitis, left hip: Secondary | ICD-10-CM | POA: Diagnosis not present

## 2016-07-31 DIAGNOSIS — E782 Mixed hyperlipidemia: Secondary | ICD-10-CM

## 2016-07-31 DIAGNOSIS — M7061 Trochanteric bursitis, right hip: Secondary | ICD-10-CM | POA: Diagnosis not present

## 2016-07-31 DIAGNOSIS — I7300 Raynaud's syndrome without gangrene: Secondary | ICD-10-CM | POA: Diagnosis not present

## 2016-07-31 DIAGNOSIS — M5030 Other cervical disc degeneration, unspecified cervical region: Secondary | ICD-10-CM | POA: Diagnosis not present

## 2016-07-31 DIAGNOSIS — E559 Vitamin D deficiency, unspecified: Secondary | ICD-10-CM

## 2016-07-31 DIAGNOSIS — R768 Other specified abnormal immunological findings in serum: Secondary | ICD-10-CM | POA: Diagnosis not present

## 2016-07-31 DIAGNOSIS — Z5181 Encounter for therapeutic drug level monitoring: Secondary | ICD-10-CM | POA: Diagnosis not present

## 2016-07-31 DIAGNOSIS — M170 Bilateral primary osteoarthritis of knee: Secondary | ICD-10-CM

## 2016-07-31 MED ORDER — TRAMADOL HCL 50 MG PO TABS: 50.0000 mg | ORAL_TABLET | Freq: Two times a day (BID) | ORAL | 2 refills | Status: DC | PRN

## 2016-07-31 MED ORDER — LIDOCAINE HCL 1 % IJ SOLN
1.5000 mL | INTRAMUSCULAR | Status: AC | PRN
  Administered 2016-07-31: 1.5 mL

## 2016-07-31 MED ORDER — TRIAMCINOLONE ACETONIDE 40 MG/ML IJ SUSP
40.0000 mg | INTRAMUSCULAR | Status: AC | PRN
  Administered 2016-07-31: 40 mg via INTRA_ARTICULAR

## 2016-07-31 NOTE — Patient Instructions (Signed)
Shoulder Exercises Ask your health care provider which exercises are safe for you. Do exercises exactly as told by your health care provider and adjust them as directed. It is normal to feel mild stretching, pulling, tightness, or discomfort as you do these exercises, but you should stop right away if you feel sudden pain or your pain gets worse.Do not begin these exercises until told by your health care provider. RANGE OF MOTION EXERCISES  These exercises warm up your muscles and joints and improve the movement and flexibility of your shoulder. These exercises also help to relieve pain, numbness, and tingling. These exercises involve stretching your injured shoulder directly. Exercise A: Pendulum   1. Stand near a wall or a surface that you can hold onto for balance. 2. Bend at the waist and let your left / right arm hang straight down. Use your other arm to support you. Keep your back straight and do not lock your knees. 3. Relax your left / right arm and shoulder muscles, and move your hips and your trunk so your left / right arm swings freely. Your arm should swing because of the motion of your body, not because you are using your arm or shoulder muscles. 4. Keep moving your body so your arm swings in the following directions, as told by your health care provider:  Side to side.  Forward and backward.  In clockwise and counterclockwise circles. 5. Continue each motion for __________ seconds, or for as long as told by your health care provider. 6. Slowly return to the starting position. Repeat __________ times. Complete this exercise __________ times a day. Exercise B:Flexion, Standing   1. Stand and hold a broomstick, a cane, or a similar object. Place your hands a little more than shoulder-width apart on the object. Your left / right hand should be palm-up, and your other hand should be palm-down. 2. Keep your elbow straight and keep your shoulder muscles relaxed. Push the stick down  with your healthy arm to raise your left / right arm in front of your body, and then over your head until you feel a stretch in your shoulder.  Avoid shrugging your shoulder while you raise your arm. Keep your shoulder blade tucked down toward the middle of your back. 3. Hold for __________ seconds. 4. Slowly return to the starting position. Repeat __________ times. Complete this exercise __________ times a day. Exercise C: Abduction, Standing  1. Stand and hold a broomstick, a cane, or a similar object. Place your hands a little more than shoulder-width apart on the object. Your left / right hand should be palm-up, and your other hand should be palm-down. 2. While keeping your elbow straight and your shoulder muscles relaxed, push the stick across your body toward your left / right side. Raise your left / right arm to the side of your body and then over your head until you feel a stretch in your shoulder.  Do not raise your arm above shoulder height, unless your health care provider tells you to do that.  Avoid shrugging your shoulder while you raise your arm. Keep your shoulder blade tucked down toward the middle of your back. 3. Hold for __________ seconds. 4. Slowly return to the starting position. Repeat __________ times. Complete this exercise __________ times a day. Exercise D:Internal Rotation   1. Place your left / right hand behind your back, palm-up. 2. Use your other hand to dangle an exercise band, a towel, or a similar object over your   shoulder. Grasp the band with your left / right hand so you are holding onto both ends. 3. Gently pull up on the band until you feel a stretch in the front of your left / right shoulder.  Avoid shrugging your shoulder while you raise your arm. Keep your shoulder blade tucked down toward the middle of your back. 4. Hold for __________ seconds. 5. Release the stretch by letting go of the band and lowering your hands. Repeat __________ times.  Complete this exercise __________ times a day. STRETCHING EXERCISES  These exercises warm up your muscles and joints and improve the movement and flexibility of your shoulder. These exercises also help to relieve pain, numbness, and tingling. These exercises are done using your healthy shoulder to help stretch the muscles of your injured shoulder. Exercise E: Corner Stretch (External Rotation and Abduction)   1. Stand in a doorway with one of your feet slightly in front of the other. This is called a staggered stance. If you cannot reach your forearms to the door frame, stand facing a corner of a room. 2. Choose one of the following positions as told by your health care provider:  Place your hands and forearms on the door frame above your head.  Place your hands and forearms on the door frame at the height of your head.  Place your hands on the door frame at the height of your elbows. 3. Slowly move your weight onto your front foot until you feel a stretch across your chest and in the front of your shoulders. Keep your head and chest upright and keep your abdominal muscles tight. 4. Hold for __________ seconds. 5. To release the stretch, shift your weight to your back foot. Repeat __________ times. Complete this stretch __________ times a day. Exercise F:Extension, Standing  1. Stand and hold a broomstick, a cane, or a similar object behind your back.  Your hands should be a little wider than shoulder-width apart.  Your palms should face away from your back. 2. Keeping your elbows straight and keeping your shoulder muscles relaxed, move the stick away from your body until you feel a stretch in your shoulder.  Avoid shrugging your shoulders while you move the stick. Keep your shoulder blade tucked down toward the middle of your back. 3. Hold for __________ seconds. 4. Slowly return to the starting position. Repeat __________ times. Complete this exercise __________ times a  day. STRENGTHENING EXERCISES  These exercises build strength and endurance in your shoulder. Endurance is the ability to use your muscles for a long time, even after they get tired. Exercise G:External Rotation   1. Sit in a stable chair without armrests. 2. Secure an exercise band at elbow height on your left / right side. 3. Place a soft object, such as a folded towel or a small pillow, between your left / right upper arm and your body to move your elbow a few inches away (about 10 cm) from your side. 4. Hold the end of the band so it is tight and there is no slack. 5. Keeping your elbow pressed against the soft object, move your left / right forearm out, away from your abdomen. Keep your body steady so only your forearm moves. 6. Hold for __________ seconds. 7. Slowly return to the starting position. Repeat __________ times. Complete this exercise __________ times a day. Exercise H:Shoulder Abduction   1. Sit in a stable chair without armrests, or stand. 2. Hold a __________ weight in your left /   right hand, or hold an exercise band with both hands. 3. Start with your arms straight down and your left / right palm facing in, toward your body. 4. Slowly lift your left / right hand out to your side. Do not lift your hand above shoulder height unless your health care provider tells you that this is safe.  Keep your arms straight.  Avoid shrugging your shoulder while you do this movement. Keep your shoulder blade tucked down toward the middle of your back. 5. Hold for __________ seconds. 6. Slowly lower your arm, and return to the starting position. Repeat __________ times. Complete this exercise __________ times a day. Exercise I:Shoulder Extension  1. Sit in a stable chair without armrests, or stand. 2. Secure an exercise band to a stable object in front of you where it is at shoulder height. 3. Hold one end of the exercise band in each hand. Your palms should face each  other. 4. Straighten your elbows and lift your hands up to shoulder height. 5. Step back, away from the secured end of the exercise band, until the band is tight and there is no slack. 6. Squeeze your shoulder blades together as you pull your hands down to the sides of your thighs. Stop when your hands are straight down by your sides. Do not let your hands go behind your body. 7. Hold for __________ seconds. 8. Slowly return to the starting position. Repeat __________ times. Complete this exercise __________ times a day. Exercise J:Standing Shoulder Row  1. Sit in a stable chair without armrests, or stand. 2. Secure an exercise band to a stable object in front of you so it is at waist height. 3. Hold one end of the exercise band in each hand. Your palms should be in a thumbs-up position. 4. Bend each of your elbows to an "L" shape (about 90 degrees) and keep your upper arms at your sides. 5. Step back until the band is tight and there is no slack. 6. Slowly pull your elbows back behind you. 7. Hold for __________ seconds. 8. Slowly return to the starting position. Repeat __________ times. Complete this exercise __________ times a day. Exercise K:Shoulder Press-Ups   1. Sit in a stable chair that has armrests. Sit upright, with your feet flat on the floor. 2. Put your hands on the armrests so your elbows are bent and your fingers are pointing forward. Your hands should be about even with the sides of your body. 3. Push down on the armrests and use your arms to lift yourself off of the chair. Straighten your elbows and lift yourself up as much as you comfortably can.  Move your shoulder blades down, and avoid letting your shoulders move up toward your ears.  Keep your feet on the ground. As you get stronger, your feet should support less of your body weight as you lift yourself up. 4. Hold for __________ seconds. 5. Slowly lower yourself back into the chair. Repeat __________ times.  Complete this exercise __________ times a day. Exercise L: Wall Push-Ups   1. Stand so you are facing a stable wall. Your feet should be about one arm-length away from the wall. 2. Lean forward and place your palms on the wall at shoulder height. 3. Keep your feet flat on the floor as you bend your elbows and lean forward toward the wall. 4. Hold for __________ seconds. 5. Straighten your elbows to push yourself back to the starting position. Repeat __________ times. Complete   this exercise __________ times a day. This information is not intended to replace advice given to you by your health care provider. Make sure you discuss any questions you have with your health care provider. Document Released: 03/12/2005 Document Revised: 01/21/2016 Document Reviewed: 01/07/2015 Elsevier Interactive Patient Education  2017 [employer]  

## 2016-08-01 ENCOUNTER — Telehealth: Payer: Self-pay | Admitting: Radiology

## 2016-08-01 DIAGNOSIS — E559 Vitamin D deficiency, unspecified: Secondary | ICD-10-CM

## 2016-08-01 MED ORDER — VITAMIN D (ERGOCALCIFEROL) 1.25 MG (50000 UNIT) PO CAPS: 50000.0000 [IU] | ORAL_CAPSULE | ORAL | 0 refills | Status: DC

## 2016-08-01 NOTE — Telephone Encounter (Signed)
I have discussed with patient/ sent in Vitamin D put in order for repeat Vitamin D

## 2016-08-01 NOTE — Progress Notes (Signed)
Vitamin D deficiency, elevated glucose. Vitamin D 50,000 units once a week for 90 days. Check vitamin D in 3 months

## 2016-08-01 NOTE — Telephone Encounter (Signed)
-----   Message from Bo Merino, MD sent at 08/01/2016  8:20 AM EDT ----- Vitamin D deficiency, elevated glucose. Vitamin D 50,000 units once a week for 90 days. Check vitamin D in 3 months

## 2016-08-05 ENCOUNTER — Telehealth: Payer: Self-pay | Admitting: Rheumatology

## 2016-08-05 LAB — CBC WITH DIFFERENTIAL/PLATELET
BASOS ABS: 0 10*3/uL (ref 0.0–0.2)
BASOS: 0 %
EOS (ABSOLUTE): 0.3 10*3/uL (ref 0.0–0.4)
Eos: 3 %
Hematocrit: 39.2 % (ref 34.0–46.6)
Hemoglobin: 12.8 g/dL (ref 11.1–15.9)
Immature Grans (Abs): 0 10*3/uL (ref 0.0–0.1)
Immature Granulocytes: 0 %
LYMPHS ABS: 2.8 10*3/uL (ref 0.7–3.1)
Lymphs: 33 %
MCH: 28.9 pg (ref 26.6–33.0)
MCHC: 32.7 g/dL (ref 31.5–35.7)
MCV: 89 fL (ref 79–97)
MONOS ABS: 0.3 10*3/uL (ref 0.1–0.9)
Monocytes: 4 %
NEUTROS ABS: 5.1 10*3/uL (ref 1.4–7.0)
Neutrophils: 60 %
PLATELETS: 331 10*3/uL (ref 150–379)
RBC: 4.43 x10E6/uL (ref 3.77–5.28)
RDW: 15.6 % — AB (ref 12.3–15.4)
WBC: 8.5 10*3/uL (ref 3.4–10.8)

## 2016-08-05 LAB — CMP14+EGFR
A/G RATIO: 0.8 — AB (ref 1.2–2.2)
ALT: 18 IU/L (ref 0–32)
AST: 32 IU/L (ref 0–40)
Albumin: 3.2 g/dL — ABNORMAL LOW (ref 3.5–5.5)
Alkaline Phosphatase: 117 IU/L (ref 39–117)
BUN/Creatinine Ratio: 18 (ref 9–23)
BUN: 9 mg/dL (ref 6–24)
Bilirubin Total: 0.4 mg/dL (ref 0.0–1.2)
CALCIUM: 8.7 mg/dL (ref 8.7–10.2)
CO2: 26 mmol/L (ref 18–29)
Chloride: 96 mmol/L (ref 96–106)
Creatinine, Ser: 0.5 mg/dL — ABNORMAL LOW (ref 0.57–1.00)
GFR calc Af Amer: 139 mL/min/{1.73_m2} (ref >59)
GFR, EST NON AFRICAN AMERICAN: 121 mL/min/{1.73_m2} (ref >59)
Globulin, Total: 4 g/dL (ref 1.5–4.5)
Glucose: 224 mg/dL — ABNORMAL HIGH (ref 65–99)
POTASSIUM: 4.1 mmol/L (ref 3.5–5.2)
SODIUM: 135 mmol/L (ref 134–144)
Total Protein: 7.2 g/dL (ref 6.0–8.5)

## 2016-08-05 LAB — IMMUNOFIXATION, SERUM
IGA/IMMUNOGLOBULIN A, SERUM: 790 mg/dL — AB (ref 87–352)
IgG (Immunoglobin G), Serum: 1802 mg/dL — ABNORMAL HIGH (ref 700–1600)
IgM (Immunoglobulin M), Srm: 90 mg/dL (ref 26–217)

## 2016-08-05 LAB — VITAMIN D 25 HYDROXY (VIT D DEFICIENCY, FRACTURES): Vit D, 25-Hydroxy: 19.7 ng/mL — ABNORMAL LOW (ref 30.0–100.0)

## 2016-08-05 NOTE — Telephone Encounter (Signed)
Patient called about lab results from last week. Patient says she never received a phone call about her abnormal lab results from September so she is requesting a call back asap about the results from last week.

## 2016-08-05 NOTE — Telephone Encounter (Signed)
Attempted to contact the patient and no answer, unable to leave a message.  

## 2016-08-06 ENCOUNTER — Telehealth: Payer: Self-pay | Admitting: *Deleted

## 2016-08-06 NOTE — Telephone Encounter (Signed)
Will you look at the ab results. You addressed the Vitamin D but not the IFE. Thanks!

## 2016-08-06 NOTE — Telephone Encounter (Signed)
Spoke with patient. Patient is asking for a copy of lab work to be faxed to her at [phone].

## 2016-08-07 NOTE — Telephone Encounter (Signed)
Results faxed to patient.

## 2016-08-07 NOTE — Telephone Encounter (Signed)
Pt has been referred to hematology. Please mail her the results

## 2016-08-15 ENCOUNTER — Encounter: Payer: Self-pay | Admitting: *Deleted

## 2016-08-18 ENCOUNTER — Other Ambulatory Visit: Payer: Self-pay | Admitting: Hematology & Oncology

## 2016-08-18 ENCOUNTER — Other Ambulatory Visit (HOSPITAL_BASED_OUTPATIENT_CLINIC_OR_DEPARTMENT_OTHER): Payer: 59

## 2016-08-18 DIAGNOSIS — E559 Vitamin D deficiency, unspecified: Secondary | ICD-10-CM

## 2016-08-18 DIAGNOSIS — D472 Monoclonal gammopathy: Secondary | ICD-10-CM

## 2016-08-18 LAB — COMPREHENSIVE METABOLIC PANEL
ALT: 21 U/L (ref 0–55)
AST: 22 U/L (ref 5–34)
Albumin: 2.9 g/dL — ABNORMAL LOW (ref 3.5–5.0)
Alkaline Phosphatase: 124 U/L (ref 40–150)
Anion Gap: 9 mEq/L (ref 3–11)
BUN: 11.3 mg/dL (ref 7.0–26.0)
CALCIUM: 9.3 mg/dL (ref 8.4–10.4)
CHLORIDE: 101 meq/L (ref 98–109)
CO2: 26 mEq/L (ref 22–29)
Creatinine: 0.8 mg/dL (ref 0.6–1.1)
EGFR: >90 mL/min/{1.73_m2} (ref >90)
Glucose: 140 mg/dl (ref 70–140)
POTASSIUM: 3.9 meq/L (ref 3.5–5.1)
SODIUM: 136 meq/L (ref 136–145)
Total Bilirubin: 0.46 mg/dL (ref 0.20–1.20)
Total Protein: 8.3 g/dL (ref 6.4–8.3)

## 2016-08-18 LAB — CBC WITH DIFFERENTIAL/PLATELET
BASO%: 0.2 % (ref 0.0–2.0)
Basophils Absolute: 0 10*3/uL (ref 0.0–0.1)
EOS%: 1.2 % (ref 0.0–7.0)
Eosinophils Absolute: 0.1 10*3/uL (ref 0.0–0.5)
HEMATOCRIT: 40.7 % (ref 34.8–46.6)
HGB: 13.6 g/dL (ref 11.6–15.9)
LYMPH%: 24.9 % (ref 14.0–49.7)
MCH: 28.9 pg (ref 25.1–34.0)
MCHC: 33.4 g/dL (ref 31.5–36.0)
MCV: 86.4 fL (ref 79.5–101.0)
MONO#: 0.4 10*3/uL (ref 0.1–0.9)
MONO%: 3.3 % (ref 0.0–14.0)
NEUT#: 8.5 10*3/uL — ABNORMAL HIGH (ref 1.5–6.5)
NEUT%: 70.4 % (ref 38.4–76.8)
Platelets: 325 10*3/uL (ref 145–400)
RBC: 4.71 10*6/uL (ref 3.70–5.45)
RDW: 15.6 % — ABNORMAL HIGH (ref 11.2–14.5)
WBC: 12.1 10*3/uL — ABNORMAL HIGH (ref 3.9–10.3)
lymph#: 3 10*3/uL (ref 0.9–3.3)
nRBC: 0 % (ref 0–0)

## 2016-08-19 LAB — IGG, IGA, IGM
IGA/IMMUNOGLOBULIN A, SERUM: 876 mg/dL — AB (ref 87–352)
IGM (IMMUNOGLOBIN M), SRM: 101 mg/dL (ref 26–217)

## 2016-08-19 LAB — KAPPA/LAMBDA LIGHT CHAINS
IG KAPPA FREE LIGHT CHAIN: 40.5 mg/L — AB (ref 3.3–19.4)
Ig Lambda Free Light Chain: 25.2 mg/L (ref 5.7–26.3)
Kappa/Lambda FluidC Ratio: 1.61 (ref 0.26–1.65)

## 2016-08-19 LAB — BETA 2 MICROGLOBULIN, SERUM: BETA 2: 2 mg/L (ref 0.6–2.4)

## 2016-08-20 LAB — PROTEIN ELECTROPHORESIS, SERUM, WITH REFLEX
A/G Ratio: 0.6 — ABNORMAL LOW (ref 0.7–1.7)
ALPHA 2: 0.8 g/dL (ref 0.4–1.0)
Albumin: 2.9 g/dL (ref 2.9–4.4)
Alpha 1: 0.3 g/dL (ref 0.0–0.4)
BETA: 1.6 g/dL — AB (ref 0.7–1.3)
GLOBULIN, TOTAL: 4.5 g/dL — AB (ref 2.2–3.9)
Gamma Globulin: 1.8 g/dL (ref 0.4–1.8)
Total Protein: 7.4 g/dL (ref 6.0–8.5)

## 2016-08-21 ENCOUNTER — Ambulatory Visit: Payer: 59

## 2016-08-21 ENCOUNTER — Ambulatory Visit (HOSPITAL_BASED_OUTPATIENT_CLINIC_OR_DEPARTMENT_OTHER): Payer: 59 | Admitting: Hematology & Oncology

## 2016-08-21 VITALS — BP 146/77 | HR 108 | Temp 98.1°F | Resp 18 | Wt 333.0 lb

## 2016-08-21 DIAGNOSIS — D890 Polyclonal hypergammaglobulinemia: Secondary | ICD-10-CM | POA: Insufficient documentation

## 2016-08-21 NOTE — Progress Notes (Signed)
Referral MD  Reason for Referral: Polyclonal gammopathy; osteoarthritis   Chief Complaint  Patient presents with  . New Patient (Initial Visit)  : I was told that I may have blood cancer.  HPI: Miranda Ross is a very charming 41 year old white female. She is a Occupational psychologist for [employer]. She comes in with a friend who also works for [employer].  She has a history of bad osteoarthritis. She sees a local rheumatologist, Dr. Estanislado Pandy. Dr. Estanislado Pandy has done lab work. She noted that there was some elevation of her immunoglobulins.   She had lab work done back in March. She was found to have elevations of her IgG and IgA immunoglobulins. The IgG was 1800 mg/dL. Her IgA was 800 mg/dL. She is a normal IgM.  She has never had a positive M spike in her serum.  She is not anemic. She does not have any renal issue. She is not hypercalcemic.  Because of Dr. Arlean Hopping thoroughness, she referred Ms. Mcfarren to the [facility] for an evaluation.  She has had no problems with recurrent infections. She's had no bony issues outside of arthritis.  I do not see any bone x-rays that have been done lately. The last chest x-ray was done 2 years ago which did not comment on any bony out of maladies.  She has had multiple surgeries.  She has a history of lymphoma in the family. Her father passed away from lymphoma at age 26 years old.  She has had no weight loss or weight gain. She has had no cough. Per graph she has not had a mammogram yet.  There's been no rashes. She's had no leg swelling.  Overall, her performance status is ECOG 1.    Past Medical History:  Diagnosis Date  . Arthritis   . Asthma    uses albuterol inh prn  . Diabetes mellitus without complication (HCC)    NIDDM  . PONV (postoperative nausea and vomiting)   . Sleep apnea    sleep study 2013, didnt finish study, left early  :  Past Surgical History:  Procedure Laterality Date   . BACK SURGERY     cervical neck fusion 2002  . MASS EXCISION Left 09/02/2012   Procedure: MINOR EXCISION OF CYST LEFT RING FINGER A-3 PULLEY;  Surgeon: Cammie Sickle., MD;  Location: [facility];  Service: Orthopedics;  Laterality: Left;  . neck fusion    . OOPHORECTOMY    . right hand surgery    . WRIST SURGERY     TFCC repair 2003  :   Current Outpatient Prescriptions:  .  albuterol (PROVENTIL HFA) 108 (90 BASE) MCG/ACT inhaler, Inhale 2 puffs into the lungs every 6 (six) hours as needed. For shortness of breath , Disp: , Rfl:  .  atorvastatin (LIPITOR) 10 MG tablet, Take 10 mg by mouth daily., Disp: , Rfl:  .  HUMALOG KWIKPEN 100 UNIT/ML KiwkPen, , Disp: , Rfl:  .  Insulin Glargine (BASAGLAR KWIKPEN) 100 UNIT/ML SOPN, , Disp: , Rfl:  .  penicillin v potassium (VEETID) 500 MG tablet, Take 500 mg by mouth every 6 (six) hours., Disp: , Rfl:  .  traMADol (ULTRAM) 50 MG tablet, Take 1 tablet (50 mg total) by mouth 2 (two) times daily as needed., Disp: 60 tablet, Rfl: 2 .  Vitamin D, Ergocalciferol, (DRISDOL) 50000 units CAPS capsule, Take 1 capsule (50,000 Units total) by mouth every 7 (seven) days., Disp: 12  capsule, Rfl: 0:  :  No Known Allergies:  No family history on file.:  Social History   Social History  . Marital status: Married    Spouse name: N/A  . Number of children: N/A  . Years of education: N/A   Occupational History  . Not on file.   Social History Main Topics  . Smoking status: Never Smoker  . Smokeless tobacco: Never Used  . Alcohol use No  . Drug use: No  . Sexual activity: Yes    Birth control/ protection: IUD   Other Topics Concern  . Not on file   Social History Narrative  . No narrative on file  :  Pertinent items are noted in HPI.  Exam:Obese white female in no obvious distress. Vital signs show a temperature of 98.1. Pulse 108. Blood pressure 146/77. Weight is 333 pounds. Head and neck exam shows no ocular or oral  lesions. There are no palpable cervical or supraclavicular lymph nodes. She has no scleral icterus. Thyroid is nonpalpable. Lungs are clear bilaterally. There may be some slight decrease at the bases. Cardiac exam regular rate and rhythm with no murmurs, rubs or bruits. Abdomen is soft. Abdomen is morbidly obese. She has decent bowel sounds. There is no fluid wave. There is no palpable liver or spleen tip. Back exam shows no tenderness over the spine, ribs or hips. No tenderness is noted over the spine to percussion. Extremities shows no clubbing, cyanosis or edema. She has decent range of motion of her joints. Neurological exam shows no focal neurological deficits. Skin exam shows no rashes, ecchymoses or petechia.   No results for input(s): WBC, HGB, HCT, PLT in the last 72 hours. No results for input(s): NA, K, CL, CO2, GLUCOSE, BUN, CREATININE, CALCIUM in the last 72 hours.  Blood smear review:  Normochromic and normocytic population of red blood cells. There is no rouleau formation. I see no nucleated red blood cells. She has noted schistocytes.. I see no polychromasia. She has no target cells. I see no teardrop cells. White cells been normal in morphology and maturation. There is no immature myeloid or lymphoid forms. I see no immature lymphoid forms. There are no plasma cells. Platelets are adequate in number and size.   Pathology: None     Assessment and Plan:  Ms. Simon is a 41 year old white female. She has a polyclonal gammopathy. There is no monoclonal gammopathy that would suggest myeloma or any plasma cell disorder. She has no M spike. Her light chains are also normal. Her Kappa Lightchain might be a little bit elevated but her lambda light chain is not decreased.  I have to believe that the SPEP is a reflection of her chronic arthritis. I spent that she has a chronic inflammatory state. I think she will always have a gammopathy.  I suppose that she is at a high risk for a monoclonal  gammopathy. As such, I think that we need to follow her along.  I spent about an hour with she and her friend. They're both very nice. She has a very good understanding of the situation and being a pharmacy tech, she does know quite a bit of medicine and has done some researching.  I reassured her that there is no "blood cancer". She is at high risk for other malignancies because of her weight. In particular, breast cancer might be a problem in the future. I really tried to persuade her to get a mammogram. I think this is very  essential for her.  I answered all of her questions. I would like to see her back in 6 months.

## 2016-08-25 LAB — UIFE/LIGHT CHAINS/TP QN, 24-HR UR
FR KAPPA LT CH,24HR: 100 mg/24 hr
FR LAMBDA LT CH,24HR: 4 mg/(24.h)
FREE KAPPA LT CHAINS, UR: 105 mg/L — AB (ref 1.35–24.19)
Free Lambda Lt Chains,Ur: 4.19 mg/L (ref 0.24–6.66)
Kappa/Lambda Ratio,U: 25.06 — ABNORMAL HIGH (ref 2.04–10.37)
PROTEIN 24H UR: 104 mg/(24.h) (ref 30–150)
PROTEIN,TOTAL,URINE: 10.9 mg/dL

## 2016-09-08 ENCOUNTER — Telehealth (INDEPENDENT_AMBULATORY_CARE_PROVIDER_SITE_OTHER): Payer: Self-pay | Admitting: Rheumatology

## 2016-09-08 NOTE — Telephone Encounter (Signed)
Patient called wanting records relating to "knee" be faxed to [facility].  I advised her that we need signed authorization before we can do that for her.  I faxed form to her per her request

## 2016-09-11 ENCOUNTER — Telehealth (INDEPENDENT_AMBULATORY_CARE_PROVIDER_SITE_OTHER): Payer: Self-pay | Admitting: Rheumatology

## 2016-09-11 NOTE — Telephone Encounter (Signed)
RECORDS FAXED TO [facility]

## 2017-01-19 NOTE — Progress Notes (Signed)
Office Visit Note  Patient: Miranda Ross             Date of Birth: 01-19-1976           MRN: [id number]             PCP: Delrae Rend, MD Referring: Delrae Rend, MD Visit Date: 01/29/2017 Occupation: @GUAROCC @    Subjective:  Pain in multiple jpints.   History of Present Illness: Miranda Ross is a 41 y.o. female with history of osteoarthritis. She states she's been having multiple arthralgias. She describes pain in her left shoulder, bilateral hands, left hip bilateral, knee joints and her feet. She denies any joint swelling. Her Raynauds does not. Active currently. Her neck pain is tolerable. She saw Dr. Marin Olp for abnormal IFE and has follow-up appointment coming up. She reports pain or without tramadol about 4-5 and with tramadol 0-1. She takes one tramadol twice a day to control her pain symptoms.  Activities of Daily Living:  Patient reports morning stiffness for 2  hour.   Patient Reports nocturnal pain.  Difficulty dressing/grooming: Reports Difficulty climbing stairs: Reports Difficulty getting out of chair: Reports Difficulty using hands for taps, buttons, cutlery, and/or writing: Reports   Review of Systems  Constitutional: Positive for fatigue. Negative for night sweats, weight gain, weight loss and weakness.  HENT: Negative for mouth sores, trouble swallowing, trouble swallowing, mouth dryness and nose dryness.   Eyes: Negative for pain, redness, visual disturbance and dryness.  Respiratory: Negative.  Negative for cough, shortness of breath and difficulty breathing.   Cardiovascular: Negative.  Positive for hypertension. Negative for chest pain, palpitations, irregular heartbeat and swelling in legs/feet.  Gastrointestinal: Negative.  Negative for blood in stool, constipation and diarrhea.  Endocrine: Negative.  Negative for increased urination.  Genitourinary: Negative.  Negative for nocturia and vaginal dryness.  Musculoskeletal: Positive for arthralgias,  joint pain, muscle weakness and morning stiffness. Negative for joint swelling, myalgias, muscle tenderness and myalgias.  Skin: Negative.  Negative for color change, rash, hair loss, skin tightness, ulcers and sensitivity to sunlight.  Allergic/Immunologic: Negative for susceptible to infections.  Neurological: Negative for dizziness, headaches, memory loss and night sweats.  Hematological: Negative.  Negative for swollen glands.  Psychiatric/Behavioral: Negative for depressed mood and sleep disturbance. The patient is not nervous/anxious.     PMFS History:  Patient Active Problem List   Diagnosis Date Noted  . Class 3 obesity with body mass index (BMI) of 50.0 to 59.9 in adult ([place]) 01/29/2017  . Polyclonal gammopathy determined by serum protein electrophoresis 08/21/2016  . MGUS (monoclonal gammopathy of unknown significance) 08/18/2016  . History of asthma 07/28/2016  . Raynaud's disease without gangrene 07/24/2016  . Primary osteoarthritis of both knees 07/24/2016  . DJD (degenerative joint disease), cervical 07/24/2016  . History of gastroesophageal reflux (GERD) 07/24/2016  . ANA positive 07/24/2016  . Trochanteric bursitis of both hips 07/24/2016  . Hyperlipidemia 06/18/2013  . Hematuria 06/18/2013    Past Medical History:  Diagnosis Date  . Arthritis   . Asthma    uses albuterol inh prn  . Diabetes mellitus without complication (HCC)    NIDDM  . PONV (postoperative nausea and vomiting)   . Sleep apnea    sleep study 2013, didnt finish study, left early    No family history on file. Past Surgical History:  Procedure Laterality Date  . BACK SURGERY     cervical neck fusion 2002  . MASS EXCISION Left 09/02/2012  Procedure: MINOR EXCISION OF CYST LEFT RING FINGER A-3 PULLEY;  Surgeon: Cammie Sickle., MD;  Location: [facility];  Service: Orthopedics;  Laterality: Left;  . neck fusion    . OOPHORECTOMY    . right hand surgery    . WRIST SURGERY      TFCC repair 2003   Social History   Social History Narrative  . No narrative on file     Objective: Vital Signs: BP (!) 159/86   Pulse 83   Resp 16   Ht 5\' 3"  (1.6 m)   Wt (!) 332 lb (150.6 kg)   BMI 58.81 kg/m    Physical Exam  Constitutional: She is oriented to person, place, and time. She appears well-developed and well-nourished.  HENT:  Head: Normocephalic and atraumatic.  Eyes: Conjunctivae and EOM are normal.  Neck: Normal range of motion.  Cardiovascular: Normal rate, regular rhythm, normal heart sounds and intact distal pulses.   Pulmonary/Chest: Effort normal and breath sounds normal.  Abdominal: Soft. Bowel sounds are normal.  Lymphadenopathy:    She has no cervical adenopathy.  Neurological: She is alert and oriented to person, place, and time.  Skin: Skin is warm and dry. Capillary refill takes less than 2 seconds.  Psychiatric: She has a normal mood and affect. Her behavior is normal.  Nursing note and vitals reviewed.    Musculoskeletal Exam: C-spine limited range of motion. Thoracic and lumbar spine limited range of motion. Shoulder joints although joints wrist joints MCPs PIPs DIPs are good range of motion with no synovitis. Hip joints knee joints ankles MTPs PIPs with good range of motion with no synovitis.  CDAI Exam: No CDAI exam completed.    Investigation: No additional findings. CBC Latest Ref Rng & Units 08/18/2016 07/31/2016 09/14/2008  WBC 3.9 - 10.3 10e3/uL 12.1(H) 8.5 7.7  Hemoglobin 11.6 - 15.9 g/dL 13.6 12.8 13.0  Hematocrit 34.8 - 46.6 % 40.7 39.2 38.0  Platelets 145 - 400 10e3/uL 325 331 341   CMP Latest Ref Rng & Units 08/18/2016 08/18/2016 07/31/2016  Glucose 70 - 140 mg/dl 140 - 224(H)  BUN 7.0 - 26.0 mg/dL 11.3 - 9  Creatinine 0.6 - 1.1 mg/dL 0.8 - 0.50(L)  Sodium 136 - 145 mEq/L 136 - 135  Potassium 3.5 - 5.1 mEq/L 3.9 - 4.1  Chloride 96 - 106 mmol/L - - 96  CO2 22 - 29 mEq/L 26 - 26  Calcium 8.4 - 10.4 mg/dL 9.3 - 8.7  Total  Protein 6.4 - 8.3 g/dL 8.3 7.4 7.2  Total Bilirubin 0.20 - 1.20 mg/dL 0.46 - 0.4  Alkaline Phos 40 - 150 U/L 124 - 117  AST 5 - 34 U/L 22 - 32  ALT 0 - 55 U/L 21 - 18    Imaging: No results found.  Speciality Comments: No specialty comments available.    Procedures:  No procedures performed Allergies: Patient has no known allergies.   Assessment / Plan:     Visit Diagnoses: Raynaud's disease without gangrene : Her Raynauds is currently not active.  ANA positive -in the past with no significant titer.  Trochanteric bursitis of both hips: She continues to have some discomfort.  DJD (degenerative joint disease), cervical - Status post fusion 2002 by Dr. Ellene Route. She has limited range of motion.  Primary osteoarthritis of both knees. She continues to have discomfort in her knee joints. She requests prescription refill on Voltaren gel which I will give today.  Chronic pain syndrome -  She was started last year on tramadol which she takes 1 tablet by mouth twice a day on when necessary basis. She states she is able to function much better with the tramadol. I will refill her tramadol today.  Medication monitoring encounter - tramadol 50 mg 2 tablets by mouth twice a day when necessary.We'll obtain UDS today. Prescription refill for tramadol was given.  Vitamin D deficiency: She is on supplement.  Abnormal laboratory test - Abnormal IFE. She will follow-up with Dr. Marin Olp in 6 months  History of hypertension: Her blood pressure is elevated today. She states that she took her blood pressure medication just before she came here. She will continue to monitor it.  Mixed hyperlipidemia  History of diabetes mellitus  History of asthma  History of gastroesophageal reflux (GERD)   Obesity: Weight loss diet and exercise discussed. Orders: Orders Placed This Encounter  Procedures  . Pain Mgmt, Profile 5 w/Conf, U   Meds ordered this encounter  Medications  . traMADol (ULTRAM) 50  MG tablet    Sig: Take 1 tablet (50 mg total) by mouth 2 (two) times daily as needed.    Dispense:  60 tablet    Refill:  2  . diclofenac sodium (VOLTAREN) 1 % transdermal gel 4 g     Follow-Up Instructions: Return for raynaud's, OA , DDD.   Bo Merino, MD  Note - This record has been created using Editor, commissioning.  Chart creation errors have been sought, but may not always  have been located. Such creation errors do not reflect on  the standard of medical care.

## 2017-01-29 ENCOUNTER — Ambulatory Visit (INDEPENDENT_AMBULATORY_CARE_PROVIDER_SITE_OTHER): Payer: 59 | Admitting: Rheumatology

## 2017-01-29 ENCOUNTER — Encounter: Payer: Self-pay | Admitting: Rheumatology

## 2017-01-29 ENCOUNTER — Other Ambulatory Visit: Payer: Self-pay | Admitting: *Deleted

## 2017-01-29 VITALS — BP 159/86 | HR 83 | Resp 16 | Ht 63.0 in | Wt 332.0 lb

## 2017-01-29 DIAGNOSIS — E559 Vitamin D deficiency, unspecified: Secondary | ICD-10-CM

## 2017-01-29 DIAGNOSIS — Z8719 Personal history of other diseases of the digestive system: Secondary | ICD-10-CM

## 2017-01-29 DIAGNOSIS — E669 Obesity, unspecified: Secondary | ICD-10-CM

## 2017-01-29 DIAGNOSIS — Z6841 Body Mass Index (BMI) 40.0 and over, adult: Secondary | ICD-10-CM

## 2017-01-29 DIAGNOSIS — Z8679 Personal history of other diseases of the circulatory system: Secondary | ICD-10-CM

## 2017-01-29 DIAGNOSIS — Z8639 Personal history of other endocrine, nutritional and metabolic disease: Secondary | ICD-10-CM

## 2017-01-29 DIAGNOSIS — E782 Mixed hyperlipidemia: Secondary | ICD-10-CM

## 2017-01-29 DIAGNOSIS — M7062 Trochanteric bursitis, left hip: Secondary | ICD-10-CM

## 2017-01-29 DIAGNOSIS — Z5181 Encounter for therapeutic drug level monitoring: Secondary | ICD-10-CM

## 2017-01-29 DIAGNOSIS — R768 Other specified abnormal immunological findings in serum: Secondary | ICD-10-CM | POA: Diagnosis not present

## 2017-01-29 DIAGNOSIS — IMO0001 Reserved for inherently not codable concepts without codable children: Secondary | ICD-10-CM

## 2017-01-29 DIAGNOSIS — M7061 Trochanteric bursitis, right hip: Secondary | ICD-10-CM

## 2017-01-29 DIAGNOSIS — M47812 Spondylosis without myelopathy or radiculopathy, cervical region: Secondary | ICD-10-CM

## 2017-01-29 DIAGNOSIS — I7300 Raynaud's syndrome without gangrene: Secondary | ICD-10-CM | POA: Diagnosis not present

## 2017-01-29 DIAGNOSIS — Z8709 Personal history of other diseases of the respiratory system: Secondary | ICD-10-CM

## 2017-01-29 DIAGNOSIS — M170 Bilateral primary osteoarthritis of knee: Secondary | ICD-10-CM

## 2017-01-29 DIAGNOSIS — R899 Unspecified abnormal finding in specimens from other organs, systems and tissues: Secondary | ICD-10-CM | POA: Diagnosis not present

## 2017-01-29 DIAGNOSIS — G894 Chronic pain syndrome: Secondary | ICD-10-CM

## 2017-01-29 DIAGNOSIS — M5030 Other cervical disc degeneration, unspecified cervical region: Secondary | ICD-10-CM | POA: Diagnosis not present

## 2017-01-29 MED ORDER — TRAMADOL HCL 50 MG PO TABS: 50.0000 mg | ORAL_TABLET | Freq: Two times a day (BID) | ORAL | 2 refills | Status: DC | PRN

## 2017-01-29 MED ORDER — DICLOFENAC SODIUM 1 % TD GEL: 4.0000 g | Freq: Three times a day (TID) | TRANSDERMAL | Status: DC | PRN

## 2017-01-31 NOTE — Progress Notes (Signed)
C/w

## 2017-02-03 LAB — PAIN MGMT, PROFILE 5 W/CONF, U
AMPHETAMINES: NEGATIVE ng/mL (ref <500)
BARBITURATES: NEGATIVE ng/mL (ref <300)
Benzodiazepines: NEGATIVE ng/mL (ref <100)
COCAINE METABOLITE: NEGATIVE ng/mL (ref <150)
CREATININE: 153.4 mg/dL
MARIJUANA METABOLITE: NEGATIVE ng/mL (ref <20)
METHADONE METABOLITE: NEGATIVE ng/mL (ref <100)
OXIDANT: NEGATIVE ug/mL (ref <200)
Opiates: NEGATIVE ng/mL (ref <100)
Oxycodone: NEGATIVE ng/mL (ref <100)
PH: 5.64 (ref 4.5–9.0)

## 2017-02-03 LAB — PAIN MGMT, TRAMADOL W/MEDMATCH, U
Desmethyltramadol: 950 ng/mL — ABNORMAL HIGH (ref <100)
TRAMADOL: 742 ng/mL — AB (ref <100)

## 2017-02-13 ENCOUNTER — Other Ambulatory Visit: Payer: 59

## 2017-02-13 ENCOUNTER — Ambulatory Visit: Payer: 59 | Admitting: Hematology & Oncology

## 2017-07-17 NOTE — Progress Notes (Signed)
Office Visit Note  Patient: Miranda Ross             Date of Birth: [birth year]           MRN: [id number]             PCP: Delrae Rend, MD Referring: Delrae Rend, MD Visit Date: 07/31/2017 Occupation: @GUAROCC @    Subjective:  Left hip pain and left shoulder pain   History of Present Illness: Miranda Ross is a 42 y.o. female with history of raynaud's disease, DDD and osteoarthritis.  Patient reports she continues to have Raynaud's in her bilateral hands and feet.  She denies any digital ulcers.  Patient states that she has bilateral CMC joint pain grip strength has begun to decrease.  She states her main concern is her left trochanteric bursitis.  She also is having left shoulder pain.  She denies any recent injuries or falls.  She states the pain in her left trochanteric bursa and left shoulder have been keeping her up at night.  She states that her neck is doing well.  She denies any radicular symptoms.  She states she continues to have some stiffness.  Patient denies any joint swelling in her knees.  She takes tramadol at bedtime for pain relief.  She needs a refill.     Activities of Daily Living:  Patient reports morning stiffness for 1 hour.   Patient Reports nocturnal pain.  Difficulty dressing/grooming: Denies Difficulty climbing stairs: Reports Difficulty getting out of chair: Reports Difficulty using hands for taps, buttons, cutlery, and/or writing: Denies   Review of Systems  Constitutional: Negative for fatigue.  HENT: Negative for mouth sores, trouble swallowing, trouble swallowing, mouth dryness and nose dryness.   Eyes: Negative for pain, redness, visual disturbance and dryness.  Respiratory: Negative for cough, hemoptysis and shortness of breath.   Cardiovascular: Negative for chest pain, palpitations, hypertension and swelling in legs/feet.  Gastrointestinal: Negative for blood in stool, constipation and diarrhea.  Endocrine: Negative for increased  urination.  Genitourinary: Negative for painful urination.  Musculoskeletal: Positive for arthralgias, joint pain, myalgias, morning stiffness and myalgias. Negative for joint swelling, muscle weakness and muscle tenderness.  Skin: Positive for color change. Negative for rash, hair loss, nodules/bumps, redness, skin tightness, ulcers and sensitivity to sunlight.  Allergic/Immunologic: Negative for susceptible to infections.  Neurological: Negative for dizziness, numbness, headaches and weakness.  Hematological: Negative for swollen glands.  Psychiatric/Behavioral: Positive for sleep disturbance. Negative for depressed mood. The patient is not nervous/anxious.     PMFS History:  Patient Active Problem List   Diagnosis Date Noted  . Class 3 obesity with body mass index (BMI) of 50.0 to 59.9 in adult 01/29/2017  . Polyclonal gammopathy determined by serum protein electrophoresis 08/21/2016  . MGUS (monoclonal gammopathy of unknown significance) 08/18/2016  . History of asthma 07/28/2016  . Raynaud's disease without gangrene 07/24/2016  . Primary osteoarthritis of both knees 07/24/2016  . DJD (degenerative joint disease), cervical 07/24/2016  . History of gastroesophageal reflux (GERD) 07/24/2016  . ANA positive 07/24/2016  . Trochanteric bursitis of both hips 07/24/2016  . Hyperlipidemia 06/18/2013  . Hematuria 06/18/2013    Past Medical History:  Diagnosis Date  . Arthritis   . Asthma    uses albuterol inh prn  . Diabetes mellitus without complication (HCC)    NIDDM  . PONV (postoperative nausea and vomiting)   . Sleep apnea    sleep study 2013, didnt finish study, left early  Family History  Problem Relation Age of Onset  . Diabetes Mother   . Heart disease Mother   . Depression Mother   . Diverticulosis Mother   . Non-Hodgkin's lymphoma Father   . Scleroderma Sister    Past Surgical History:  Procedure Laterality Date  . BACK SURGERY     cervical neck fusion 2002    . MASS EXCISION Left 09/02/2012   Procedure: MINOR EXCISION OF CYST LEFT RING FINGER A-3 PULLEY;  Surgeon: Cammie Sickle., MD;  Location: [facility];  Service: Orthopedics;  Laterality: Left;  . neck fusion    . OOPHORECTOMY    . right hand surgery    . WRIST SURGERY     TFCC repair 2003   Social History   Social History Narrative  . Not on file     Objective: Vital Signs: BP 128/80 (BP Location: Left Arm, Patient Position: Sitting, Cuff Size: Large)   Pulse 89   Resp 17   Ht 5\' 3"  (1.6 m)   Wt (!) 309 lb (140.2 kg)   BMI 54.74 kg/m    Physical Exam  Constitutional: She is oriented to person, place, and time. She appears well-developed and well-nourished.  HENT:  Head: Normocephalic and atraumatic.  Eyes: Conjunctivae and EOM are normal.  Neck: Normal range of motion.  Cardiovascular: Normal rate, regular rhythm, normal heart sounds and intact distal pulses.  Pulmonary/Chest: Effort normal and breath sounds normal.  Abdominal: Soft. Bowel sounds are normal.  Lymphadenopathy:    She has no cervical adenopathy.  Neurological: She is alert and oriented to person, place, and time.  Skin: Skin is warm and dry. Capillary refill takes less than 2 seconds.  Psychiatric: She has a normal mood and affect. Her behavior is normal.  Nursing note and vitals reviewed.    Musculoskeletal Exam: C-spine, thoracic spine, lumbar spine good range of motion.  No midline spinal tenderness.  No SI joint tenderness.  Shoulder joints, elbow joints, wrist joints, MCPs, PIPs, DIPs good range of motion with no synovitis.  She has tenderness of bilateral CMC joints.  She has some discomfort with shoulder range of motion.  Hip joints, knee joints, ankle joints, MTPs, PIPs, DIPs good range of motion with no synovitis.  No warmth or effusion of knees.  She has bilateral knee crepitus.  She has tenderness of the left trochanteric bursa.   CDAI Exam: No CDAI exam completed.     Investigation: No additional findings. CBC Latest Ref Rng & Units 08/18/2016 07/31/2016 09/14/2008  WBC 3.9 - 10.3 10e3/uL 12.1(H) 8.5 7.7  Hemoglobin 11.6 - 15.9 g/dL 13.6 12.8 13.0  Hematocrit 34.8 - 46.6 % 40.7 39.2 38.0  Platelets 145 - 400 10e3/uL 325 331 341   CMP Latest Ref Rng & Units 08/18/2016 08/18/2016 07/31/2016  Glucose 70 - 140 mg/dl 140 - 224(H)  BUN 7.0 - 26.0 mg/dL 11.3 - 9  Creatinine 0.6 - 1.1 mg/dL 0.8 - 0.50(L)  Sodium 136 - 145 mEq/L 136 - 135  Potassium 3.5 - 5.1 mEq/L 3.9 - 4.1  Chloride 96 - 106 mmol/L - - 96  CO2 22 - 29 mEq/L 26 - 26  Calcium 8.4 - 10.4 mg/dL 9.3 - 8.7  Total Protein 6.4 - 8.3 g/dL 8.3 7.4 7.2  Total Bilirubin 0.20 - 1.20 mg/dL 0.46 - 0.4  Alkaline Phos 40 - 150 U/L 124 - 117  AST 5 - 34 U/L 22 - 32  ALT 0 - 55 U/L  21 - 18    Imaging: No results found.  Speciality Comments: No specialty comments available.    Procedures:  Large Joint Inj: L greater trochanter on 07/31/2017 9:29 AM Indications: pain Details: 27 G 1.5 in needle, lateral approach  Arthrogram: No  Medications: 1 mL lidocaine 1 %; 40 mg triamcinolone acetonide 40 MG/ML Aspirate: 0 mL Outcome: tolerated well, no immediate complications Procedure, treatment alternatives, risks and benefits explained, specific risks discussed. Consent was given by the patient. Immediately prior to procedure a time out was called to verify the correct patient, procedure, equipment, support staff and site/side marked as required. Patient was prepped and draped in the usual sterile fashion.     Allergies: Patient has no known allergies.   Assessment / Plan:     Visit Diagnoses: Raynaud's disease without gangrene: She continues to have symptoms of Raynaud's in her bilateral hands and bilateral feet.  She has no signs of digital ulcerations or gangrene.  She was advised to wear gloves and thick socks.  She should also keep her core body temperature warm.  ANA positive  Primary  osteoarthritis of both knees: No warmth or effusion of bilateral knees.  She has bilateral knee crepitus.  She has no discomfort in her knees at this time.  DDD (degenerative disc disease), cervical - Status post fusion of C5-C6 in 2002 by Dr. Ellene Route.  She has some neck stiffness but no discomfort at this time.  No radicular symptoms.  Chronic pain syndrome -UDS was updated today.  Narc agreement: 01/29/2017  Abnormal laboratory test - Abnormal IFE. She will follow-up with Dr. Marin Olp  History of vitamin D deficiency she is no longer taking vitamin D supplement.  Medication monitoring encounter -UDS was updated today.  If her UDS is consistent with treatment we will refill her tramadol.  Plan: Pain Mgmt, Tramadol w/medMATCH, U, Pain Mgmt, Profile 5 w/Conf, U  Trochanteric bursitis, left hip: She has tenderness of left trochanteric bursa.  She requested a cortisone injection today.  She tolerated the procedure well.  She is advised to monitor her blood Leukos level and blood pressure closely while in the cortisone injection.  She was given a handout of exercises that she can perform at home.  Chronic left shoulder pain: She has good ROM on exam with mild discomfort.  No recent injuries or falls.  She was given a handout of exercises that she can perform at home.  Due to her history of diabetes, we did not inject her left shoulder today.  She can return in 1 week if her blood glucose levels are controlled, we will give her a cortisone injection.    History of hypertension: She was advised to monitor her blood pressure closely following the cortisone injection today.  History of diabetes mellitus: She is advised to monitor blood sugar closely following the cortisone injection.  Other medical conditions are listed as follows:   History of asthma  History of hyperlipidemia  History of gastroesophageal reflux (GERD)      Orders: Orders Placed This Encounter  Procedures  . Large Joint Inj   . Pain Mgmt, Tramadol w/medMATCH, U  . Pain Mgmt, Profile 5 w/Conf, U   Meds ordered this encounter  Medications  . diclofenac sodium (VOLTAREN) 1 % GEL    Sig: Apply three grams to three large joints up to three times daily    Dispense:  3 Tube    Refill:  3    Face-to-face time spent with patient was  30  minutes. >50% of time was spent in counseling and coordination of care.  Follow-Up Instructions: Return in about 6 months (around 01/31/2018) for Raynaud's, Osteoarthritis, DDD.   Ofilia Neas, PA-C   I examined and evaluated the patient with Hazel Sams PA.  Patient had painful range of motion of her shoulder joint consistent with shoulder joint bursitis as described above.  She also trochanteric bursitis.  She is diabetic we decided to inject only trochanteric bursa today .The plan of care was discussed as noted above.  Bo Merino, MD  Note - This record has been created using Editor, commissioning.  Chart creation errors have been sought, but may not always  have been located. Such creation errors do not reflect on  the standard of medical care.

## 2017-07-31 ENCOUNTER — Ambulatory Visit: Payer: 59 | Admitting: Rheumatology

## 2017-07-31 ENCOUNTER — Encounter: Payer: Self-pay | Admitting: Rheumatology

## 2017-07-31 VITALS — BP 128/80 | HR 89 | Resp 17 | Ht 63.0 in | Wt 309.0 lb

## 2017-07-31 DIAGNOSIS — R899 Unspecified abnormal finding in specimens from other organs, systems and tissues: Secondary | ICD-10-CM

## 2017-07-31 DIAGNOSIS — M7061 Trochanteric bursitis, right hip: Secondary | ICD-10-CM | POA: Diagnosis not present

## 2017-07-31 DIAGNOSIS — M5030 Other cervical disc degeneration, unspecified cervical region: Secondary | ICD-10-CM

## 2017-07-31 DIAGNOSIS — M7062 Trochanteric bursitis, left hip: Secondary | ICD-10-CM

## 2017-07-31 DIAGNOSIS — G894 Chronic pain syndrome: Secondary | ICD-10-CM

## 2017-07-31 DIAGNOSIS — M170 Bilateral primary osteoarthritis of knee: Secondary | ICD-10-CM

## 2017-07-31 DIAGNOSIS — Z8639 Personal history of other endocrine, nutritional and metabolic disease: Secondary | ICD-10-CM

## 2017-07-31 DIAGNOSIS — Z5181 Encounter for therapeutic drug level monitoring: Secondary | ICD-10-CM

## 2017-07-31 DIAGNOSIS — I7300 Raynaud's syndrome without gangrene: Secondary | ICD-10-CM

## 2017-07-31 DIAGNOSIS — Z8679 Personal history of other diseases of the circulatory system: Secondary | ICD-10-CM | POA: Diagnosis not present

## 2017-07-31 DIAGNOSIS — R768 Other specified abnormal immunological findings in serum: Secondary | ICD-10-CM | POA: Diagnosis not present

## 2017-07-31 DIAGNOSIS — Z8719 Personal history of other diseases of the digestive system: Secondary | ICD-10-CM

## 2017-07-31 DIAGNOSIS — R7689 Other specified abnormal immunological findings in serum: Secondary | ICD-10-CM

## 2017-07-31 DIAGNOSIS — M25512 Pain in left shoulder: Secondary | ICD-10-CM

## 2017-07-31 DIAGNOSIS — G8929 Other chronic pain: Secondary | ICD-10-CM

## 2017-07-31 DIAGNOSIS — Z8709 Personal history of other diseases of the respiratory system: Secondary | ICD-10-CM

## 2017-07-31 MED ORDER — TRIAMCINOLONE ACETONIDE 40 MG/ML IJ SUSP
40.0000 mg | INTRAMUSCULAR | Status: AC | PRN
  Administered 2017-07-31: 40 mg via INTRA_ARTICULAR

## 2017-07-31 MED ORDER — LIDOCAINE HCL 1 % IJ SOLN
1.0000 mL | INTRAMUSCULAR | Status: AC | PRN
  Administered 2017-07-31: 1 mL

## 2017-07-31 MED ORDER — DICLOFENAC SODIUM 1 % TD GEL: TRANSDERMAL | 3 refills | Status: DC

## 2017-07-31 NOTE — Patient Instructions (Signed)
Trochanteric Bursitis Rehab Ask your health care provider which exercises are safe for you. Do exercises exactly as told by your health care provider and adjust them as directed. It is normal to feel mild stretching, pulling, tightness, or discomfort as you do these exercises, but you should stop right away if you feel sudden pain or your pain gets worse.Do not begin these exercises until told by your health care provider. Stretching exercises These exercises warm up your muscles and joints and improve the movement and flexibility of your hip. These exercises also help to relieve pain and stiffness. Exercise A: Iliotibial band stretch  1. Lie on your side with your left / right leg in the top position. 2. Bend your left / right knee and grab your ankle. 3. Slowly bring your knee back so your thigh is behind your body. 4. Slowly lower your knee toward the floor until you feel a gentle stretch on the outside of your left / right thigh. If you do not feel a stretch and your knee will not fall farther, place the heel of your other foot on top of your outer knee and pull your thigh down farther. 5. Hold this position for __________ seconds. 6. Slowly return to the starting position. Repeat __________ times. Complete this exercise __________ times a day. Strengthening exercises These exercises build strength and endurance in your hip and pelvis. Endurance is the ability to use your muscles for a long time, even after they get tired. Exercise B: Bridge ( hip extensors) 1. Lie on your back on a firm surface with your knees bent and your feet flat on the floor. 2. Tighten your buttocks muscles and lift your buttocks off the floor until your trunk is level with your thighs. You should feel the muscles working in your buttocks and the back of your thighs. If this exercise is too easy, try doing it with your arms crossed over your chest. 3. Hold this position for __________ seconds. 4. Slowly return to the  starting position. 5. Let your muscles relax completely between repetitions. Repeat __________ times. Complete this exercise __________ times a day. Exercise C: Squats ( knee extensors and  quadriceps) 1. Stand in front of a table, with your feet and knees pointing straight ahead. You may rest your hands on the table for balance but not for support. 2. Slowly bend your knees and lower your hips like you are going to sit in a chair. ? Keep your weight over your heels, not over your toes. ? Keep your lower legs upright so they are parallel with the table legs. ? Do not let your hips go lower than your knees. ? Do not bend lower than told by your health care provider. ? If your hip pain increases, do not bend as low. 3. Hold this position for __________ seconds. 4. Slowly push with your legs to return to standing. Do not use your hands to pull yourself to standing. Repeat __________ times. Complete this exercise __________ times a day. Exercise D: Hip hike 1. Stand sideways on a bottom step. Stand on your left / right leg with your other foot unsupported next to the step. You can hold onto the railing or wall if needed for balance. 2. Keeping your knees straight and your torso square, lift your left / right hip up toward the ceiling. 3. Hold this position for __________ seconds. 4. Slowly let your left / right hip lower toward the floor, past the starting position. Your foot   should get closer to the floor. Do not lean or bend your knees. Repeat __________ times. Complete this exercise __________ times a day. Exercise E: Single leg stand 1. Stand near a counter or door frame that you can hold onto for balance as needed. It is helpful to stand in front of a mirror for this exercise so you can watch your hip. 2. Squeeze your left / right buttock muscles then lift up your other foot. Do not let your left / right hip push out to the side. 3. Hold this position for __________ seconds. Repeat  __________ times. Complete this exercise __________ times a day. This information is not intended to replace advice given to you by your health care provider. Make sure you discuss any questions you have with your health care provider. Document Released: 06/05/2004 Document Revised: 01/03/2016 Document Reviewed: 04/13/2015 Elsevier Interactive Patient Education  2018 [employer]. Shoulder Exercises Ask your health care provider which exercises are safe for you. Do exercises exactly as told by your health care provider and adjust them as directed. It is normal to feel mild stretching, pulling, tightness, or discomfort as you do these exercises, but you should stop right away if you feel sudden pain or your pain gets worse.Do not begin these exercises until told by your health care provider. RANGE OF MOTION EXERCISES These exercises warm up your muscles and joints and improve the movement and flexibility of your shoulder. These exercises also help to relieve pain, numbness, and tingling. These exercises involve stretching your injured shoulder directly. Exercise A: Pendulum  1. Stand near a wall or a surface that you can hold onto for balance. 2. Bend at the waist and let your left / right arm hang straight down. Use your other arm to support you. Keep your back straight and do not lock your knees. 3. Relax your left / right arm and shoulder muscles, and move your hips and your trunk so your left / right arm swings freely. Your arm should swing because of the motion of your body, not because you are using your arm or shoulder muscles. 4. Keep moving your body so your arm swings in the following directions, as told by your health care provider: ? Side to side. ? Forward and backward. ? In clockwise and counterclockwise circles. 5. Continue each motion for __________ seconds, or for as long as told by your health care provider. 6. Slowly return to the starting position. Repeat __________ times.  Complete this exercise __________ times a day. Exercise B:Flexion, Standing  1. Stand and hold a broomstick, a cane, or a similar object. Place your hands a little more than shoulder-width apart on the object. Your left / right hand should be palm-up, and your other hand should be palm-down. 2. Keep your elbow straight and keep your shoulder muscles relaxed. Push the stick down with your healthy arm to raise your left / right arm in front of your body, and then over your head until you feel a stretch in your shoulder. ? Avoid shrugging your shoulder while you raise your arm. Keep your shoulder blade tucked down toward the middle of your back. 3. Hold for __________ seconds. 4. Slowly return to the starting position. Repeat __________ times. Complete this exercise __________ times a day. Exercise C: Abduction, Standing 1. Stand and hold a broomstick, a cane, or a similar object. Place your hands a little more than shoulder-width apart on the object. Your left / right hand should be palm-up, and  your other hand should be palm-down. 2. While keeping your elbow straight and your shoulder muscles relaxed, push the stick across your body toward your left / right side. Raise your left / right arm to the side of your body and then over your head until you feel a stretch in your shoulder. ? Do not raise your arm above shoulder height, unless your health care provider tells you to do that. ? Avoid shrugging your shoulder while you raise your arm. Keep your shoulder blade tucked down toward the middle of your back. 3. Hold for __________ seconds. 4. Slowly return to the starting position. Repeat __________ times. Complete this exercise __________ times a day. Exercise D:Internal Rotation  1. Place your left / right hand behind your back, palm-up. 2. Use your other hand to dangle an exercise band, a towel, or a similar object over your shoulder. Grasp the band with your left / right hand so you are holding  onto both ends. 3. Gently pull up on the band until you feel a stretch in the front of your left / right shoulder. ? Avoid shrugging your shoulder while you raise your arm. Keep your shoulder blade tucked down toward the middle of your back. 4. Hold for __________ seconds. 5. Release the stretch by letting go of the band and lowering your hands. Repeat __________ times. Complete this exercise __________ times a day. STRETCHING EXERCISES These exercises warm up your muscles and joints and improve the movement and flexibility of your shoulder. These exercises also help to relieve pain, numbness, and tingling. These exercises are done using your healthy shoulder to help stretch the muscles of your injured shoulder. Exercise E: Warehouse manager (External Rotation and Abduction)  1. Stand in a doorway with one of your feet slightly in front of the other. This is called a staggered stance. If you cannot reach your forearms to the door frame, stand facing a corner of a room. 2. Choose one of the following positions as told by your health care provider: ? Place your hands and forearms on the door frame above your head. ? Place your hands and forearms on the door frame at the height of your head. ? Place your hands on the door frame at the height of your elbows. 3. Slowly move your weight onto your front foot until you feel a stretch across your chest and in the front of your shoulders. Keep your head and chest upright and keep your abdominal muscles tight. 4. Hold for __________ seconds. 5. To release the stretch, shift your weight to your back foot. Repeat __________ times. Complete this stretch __________ times a day. Exercise F:Extension, Standing 1. Stand and hold a broomstick, a cane, or a similar object behind your back. ? Your hands should be a little wider than shoulder-width apart. ? Your palms should face away from your back. 2. Keeping your elbows straight and keeping your shoulder muscles  relaxed, move the stick away from your body until you feel a stretch in your shoulder. ? Avoid shrugging your shoulders while you move the stick. Keep your shoulder blade tucked down toward the middle of your back. 3. Hold for __________ seconds. 4. Slowly return to the starting position. Repeat __________ times. Complete this exercise __________ times a day. STRENGTHENING EXERCISES These exercises build strength and endurance in your shoulder. Endurance is the ability to use your muscles for a long time, even after they get tired. Exercise G:External Rotation  1. Sit in a stable  chair without armrests. 2. Secure an exercise band at elbow height on your left / right side. 3. Place a soft object, such as a folded towel or a small pillow, between your left / right upper arm and your body to move your elbow a few inches away (about 10 cm) from your side. 4. Hold the end of the band so it is tight and there is no slack. 5. Keeping your elbow pressed against the soft object, move your left / right forearm out, away from your abdomen. Keep your body steady so only your forearm moves. 6. Hold for __________ seconds. 7. Slowly return to the starting position. Repeat __________ times. Complete this exercise __________ times a day. Exercise H:Shoulder Abduction  1. Sit in a stable chair without armrests, or stand. 2. Hold a __________ weight in your left / right hand, or hold an exercise band with both hands. 3. Start with your arms straight down and your left / right palm facing in, toward your body. 4. Slowly lift your left / right hand out to your side. Do not lift your hand above shoulder height unless your health care provider tells you that this is safe. ? Keep your arms straight. ? Avoid shrugging your shoulder while you do this movement. Keep your shoulder blade tucked down toward the middle of your back. 5. Hold for __________ seconds. 6. Slowly lower your arm, and return to the starting  position. Repeat __________ times. Complete this exercise __________ times a day. Exercise I:Shoulder Extension 1. Sit in a stable chair without armrests, or stand. 2. Secure an exercise band to a stable object in front of you where it is at shoulder height. 3. Hold one end of the exercise band in each hand. Your palms should face each other. 4. Straighten your elbows and lift your hands up to shoulder height. 5. Step back, away from the secured end of the exercise band, until the band is tight and there is no slack. 6. Squeeze your shoulder blades together as you pull your hands down to the sides of your thighs. Stop when your hands are straight down by your sides. Do not let your hands go behind your body. 7. Hold for __________ seconds. 8. Slowly return to the starting position. Repeat __________ times. Complete this exercise __________ times a day. Exercise J:Standing Shoulder Row 1. Sit in a stable chair without armrests, or stand. 2. Secure an exercise band to a stable object in front of you so it is at waist height. 3. Hold one end of the exercise band in each hand. Your palms should be in a thumbs-up position. 4. Bend each of your elbows to an "L" shape (about 90 degrees) and keep your upper arms at your sides. 5. Step back until the band is tight and there is no slack. 6. Slowly pull your elbows back behind you. 7. Hold for __________ seconds. 8. Slowly return to the starting position. Repeat __________ times. Complete this exercise __________ times a day. Exercise K:Shoulder Press-Ups  1. Sit in a stable chair that has armrests. Sit upright, with your feet flat on the floor. 2. Put your hands on the armrests so your elbows are bent and your fingers are pointing forward. Your hands should be about even with the sides of your body. 3. Push down on the armrests and use your arms to lift yourself off of the chair. Straighten your elbows and lift yourself up as much as you  comfortably can. ?  Move your shoulder blades down, and avoid letting your shoulders move up toward your ears. ? Keep your feet on the ground. As you get stronger, your feet should support less of your body weight as you lift yourself up. 4. Hold for __________ seconds. 5. Slowly lower yourself back into the chair. Repeat __________ times. Complete this exercise __________ times a day. Exercise L: Wall Push-Ups  1. Stand so you are facing a stable wall. Your feet should be about one arm-length away from the wall. 2. Lean forward and place your palms on the wall at shoulder height. 3. Keep your feet flat on the floor as you bend your elbows and lean forward toward the wall. 4. Hold for __________ seconds. 5. Straighten your elbows to push yourself back to the starting position. Repeat __________ times. Complete this exercise __________ times a day. This information is not intended to replace advice given to you by your health care provider. Make sure you discuss any questions you have with your health care provider. Document Released: 03/12/2005 Document Revised: 01/21/2016 Document Reviewed: 01/07/2015 Elsevier Interactive Patient Education  2018 [employer].

## 2017-08-02 LAB — PAIN MGMT, PROFILE 5 W/CONF, U
AMPHETAMINES: NEGATIVE ng/mL (ref <500)
BARBITURATES: NEGATIVE ng/mL (ref <300)
BENZODIAZEPINES: NEGATIVE ng/mL (ref <100)
COCAINE METABOLITE: NEGATIVE ng/mL (ref <150)
CREATININE: 154.2 mg/dL
METHADONE METABOLITE: NEGATIVE ng/mL (ref <100)
Marijuana Metabolite: NEGATIVE ng/mL (ref <20)
OPIATES: NEGATIVE ng/mL (ref <100)
OXIDANT: NEGATIVE ug/mL (ref <200)
Oxycodone: NEGATIVE ng/mL (ref <100)
pH: 6.46 (ref 4.5–9.0)

## 2017-08-02 LAB — PAIN MGMT, TRAMADOL W/MEDMATCH, U
DESMETHYLTRAMADOL: 1573 ng/mL — AB (ref <100)
Tramadol: 804 ng/mL — ABNORMAL HIGH (ref <100)

## 2017-08-03 ENCOUNTER — Telehealth: Payer: Self-pay | Admitting: Rheumatology

## 2017-08-03 DIAGNOSIS — G894 Chronic pain syndrome: Secondary | ICD-10-CM

## 2017-08-03 MED ORDER — TRAMADOL HCL 50 MG PO TABS: 50.0000 mg | ORAL_TABLET | Freq: Two times a day (BID) | ORAL | 0 refills | Status: DC | PRN

## 2017-08-03 NOTE — Telephone Encounter (Signed)
Last Visit: 07/31/17 Next Visit: 02/02/18 UDS: 07/31/17 Narc Agreement: 01/29/17  Okay to refill Tramadol?

## 2017-08-03 NOTE — Progress Notes (Signed)
Consistent with treatment

## 2017-08-03 NOTE — Telephone Encounter (Signed)
Patient called requesting prescription refill of Tramadol.  Patient states she had her urine screening on Friday.  Patient will run out of medication tomorrow.  Patient's pharmacy is [facility] on [address].

## 2017-08-07 ENCOUNTER — Ambulatory Visit: Payer: 59 | Admitting: Physician Assistant

## 2017-08-07 VITALS — BP 155/96 | HR 80

## 2017-08-07 DIAGNOSIS — G8929 Other chronic pain: Secondary | ICD-10-CM

## 2017-08-07 DIAGNOSIS — M25512 Pain in left shoulder: Secondary | ICD-10-CM | POA: Diagnosis not present

## 2017-08-07 MED ORDER — LIDOCAINE HCL 1 % IJ SOLN
1.5000 mL | INTRAMUSCULAR | Status: AC | PRN
  Administered 2017-08-07: 1.5 mL

## 2017-08-07 MED ORDER — TRIAMCINOLONE ACETONIDE 40 MG/ML IJ SUSP
40.0000 mg | INTRAMUSCULAR | Status: AC | PRN
  Administered 2017-08-07: 40 mg via INTRA_ARTICULAR

## 2017-08-07 NOTE — Progress Notes (Signed)
   Procedure Note  Patient: Miranda Ross             Date of Birth: Jan 06, 1976           MRN: [id number]             Visit Date: 08/07/2017  Procedures: Visit Diagnoses: Chronic left shoulder pain  Large Joint Inj: L glenohumeral on 08/07/2017 8:52 AM Indications: pain Details: 27 G 1.5 in needle, posterior approach  Arthrogram: No  Medications: 1.5 mL lidocaine 1 %; 40 mg triamcinolone acetonide 40 MG/ML Aspirate: 0 mL Outcome: tolerated well, no immediate complications Procedure, treatment alternatives, risks and benefits explained, specific risks discussed. Consent was given by the patient. Immediately prior to procedure a time out was called to verify the correct patient, procedure, equipment, support staff and site/side marked as required. Patient was prepped and draped in the usual sterile fashion.    Patient tolerated the procedure well.   Hazel Sams, PA-C  I examined and evaluated the patient with Hazel Sams PA. The plan of care was discussed as noted above.  Bo Merino, MD

## 2017-09-28 ENCOUNTER — Other Ambulatory Visit: Payer: Self-pay | Admitting: Physician Assistant

## 2017-09-28 DIAGNOSIS — G894 Chronic pain syndrome: Secondary | ICD-10-CM

## 2017-09-28 NOTE — Telephone Encounter (Signed)
Last Visit: 08/07/17 Next visit: 02/02/18 UDS: 07/31/17 Narc Agreement: 01/29/17  Okay to refill Tramadol?

## 2017-12-30 ENCOUNTER — Other Ambulatory Visit: Payer: Self-pay | Admitting: Physician Assistant

## 2017-12-30 DIAGNOSIS — G894 Chronic pain syndrome: Secondary | ICD-10-CM

## 2017-12-30 NOTE — Telephone Encounter (Signed)
Last Visit: 08/07/17 Next visit: 02/02/18 UDS: 07/31/17 Narc Agreement: 01/29/17  Last Fill: 09/28/17  Okay to refill Tramadol?

## 2017-12-30 NOTE — Telephone Encounter (Signed)
Ok to refill. We will need her to update narcotic agreement at her next office visit.

## 2018-01-21 NOTE — Progress Notes (Signed)
Office Visit Note  Patient: Miranda Ross             Date of Birth: March 27, 1976           MRN: [id number]             PCP: Delrae Rend, MD Referring: Delrae Rend, MD Visit Date: 02/02/2018 Occupation: @GUAROCC @  Subjective:  Foot Pain (Left foot pain, no injury); Leg Pain (Left leg pain); and Hand Pain (Bil hand pain, swelling, stiffness)   History of Present Illness: Miranda Ross is a 42 y.o. female with history of osteoarthritis and raynaud's phenominon.  She states she has intermittent flares of Raynolds which is not been as bad during the summertime.  She had Achilles tendon repair of left ankle.  She states it still continues to bother her.  She has been also experiencing some pain over the left trochanteric bursa.  She states her bilateral CMC joints had been painful.  She has been also experiencing some stiffness in her hands.  She has some crepitus in her knee joints.  The neck pain is tolerable.  She has some left trapezius pain.  Activities of Daily Living:  Patient reports morning stiffness for 1 hour.   Patient Reports nocturnal pain.  Difficulty dressing/grooming: Denies Difficulty climbing stairs: Reports Difficulty getting out of chair: Reports Difficulty using hands for taps, buttons, cutlery, and/or writing: Reports  Review of Systems  Constitutional: Positive for fatigue. Negative for night sweats, weight gain and weight loss.  HENT: Negative for mouth sores, trouble swallowing, trouble swallowing, mouth dryness and nose dryness.   Eyes: Negative for pain, redness, visual disturbance and dryness.  Respiratory: Negative for cough, shortness of breath and difficulty breathing.   Cardiovascular: Negative for chest pain, palpitations, hypertension, irregular heartbeat and swelling in legs/feet.  Gastrointestinal: Negative for blood in stool, constipation and diarrhea.  Endocrine: Negative for increased urination.  Genitourinary: Negative for difficulty urinating  and vaginal dryness.  Musculoskeletal: Positive for arthralgias, joint pain and morning stiffness. Negative for joint swelling, myalgias, muscle weakness, muscle tenderness and myalgias.  Skin: Negative for color change, rash, hair loss, skin tightness, ulcers and sensitivity to sunlight.  Allergic/Immunologic: Negative for susceptible to infections.  Neurological: Negative for dizziness, numbness, memory loss, night sweats and weakness.  Hematological: Negative for bruising/bleeding tendency and swollen glands.  Psychiatric/Behavioral: Positive for sleep disturbance. Negative for depressed mood. The patient is not nervous/anxious.     PMFS History:  Patient Active Problem List   Diagnosis Date Noted  . Class 3 obesity with body mass index (BMI) of 50.0 to 59.9 in adult 01/29/2017  . Polyclonal gammopathy determined by serum protein electrophoresis 08/21/2016  . MGUS (monoclonal gammopathy of unknown significance) 08/18/2016  . History of asthma 07/28/2016  . Raynaud's disease without gangrene 07/24/2016  . Primary osteoarthritis of both knees 07/24/2016  . DJD (degenerative joint disease), cervical 07/24/2016  . History of gastroesophageal reflux (GERD) 07/24/2016  . ANA positive 07/24/2016  . Trochanteric bursitis of both hips 07/24/2016  . Hyperlipidemia 06/18/2013  . Hematuria 06/18/2013    Past Medical History:  Diagnosis Date  . Arthritis   . Asthma    uses albuterol inh prn  . Diabetes mellitus without complication (HCC)    NIDDM  . PONV (postoperative nausea and vomiting)   . Sleep apnea    sleep study 2013, didnt finish study, left early    Family History  Problem Relation Age of Onset  . Diabetes Mother   .  Heart disease Mother   . Depression Mother   . Diverticulosis Mother   . Non-Hodgkin's lymphoma Father   . Scleroderma Sister    Past Surgical History:  Procedure Laterality Date  . BACK SURGERY     cervical neck fusion 2002  . FOOT SURGERY    . MASS  EXCISION Left 09/02/2012   Procedure: MINOR EXCISION OF CYST LEFT RING FINGER A-3 PULLEY;  Surgeon: Cammie Sickle., MD;  Location: [place];  Service: Orthopedics;  Laterality: Left;  . neck fusion    . OOPHORECTOMY    . right hand surgery    . WRIST SURGERY     TFCC repair 2003   Social History   Social History Narrative  . Not on file    Objective: Vital Signs: BP (!) 142/81 (BP Location: Left Arm, Patient Position: Sitting, Cuff Size: Large)   Pulse 77   Resp 18   Ht 5\' 3"  (1.6 m)   Wt (!) 313 lb 6.4 oz (142.2 kg)   BMI 55.52 kg/m    Physical Exam  Constitutional: She is oriented to person, place, and time. She appears well-developed and well-nourished.  HENT:  Head: Normocephalic and atraumatic.  Eyes: Conjunctivae and EOM are normal.  Neck: Normal range of motion.  Cardiovascular: Normal rate, regular rhythm, normal heart sounds and intact distal pulses.  Pulmonary/Chest: Effort normal and breath sounds normal.  Abdominal: Soft. Bowel sounds are normal.  Lymphadenopathy:    She has no cervical adenopathy.  Neurological: She is alert and oriented to person, place, and time.  Skin: Skin is warm and dry. Capillary refill takes less than 2 seconds.  Psychiatric: She has a normal mood and affect. Her behavior is normal.  Nursing note and vitals reviewed.    Musculoskeletal Exam: C-spine good range of motion.  Shoulder joints, elbow joints, wrist MCPs PIPs were in good range of motion.  She has some DIP thickening.  No synovitis was noted.  No Raynauds was noted.  Hip joints knee joints were in good range of motion.  She has some tenderness over left trochanteric bursa.  She had tenderness over her left ankle where she had surgery in the past.   CDAI Exam: CDAI Score: Not documented Patient Global Assessment: Not documented; Provider Global Assessment: Not documented Swollen: Not documented; Tender: Not documented Joint Exam   Not documented    There is currently no information documented on the homunculus. Go to the Rheumatology activity and complete the homunculus joint exam.  Investigation: No additional findings.  Imaging: No results found.  Recent Labs: Lab Results  Component Value Date   WBC 12.1 (H) 08/18/2016   HGB 13.6 08/18/2016   PLT 325 08/18/2016   NA 136 08/18/2016   K 3.9 08/18/2016   CL 96 07/31/2016   CO2 26 08/18/2016   GLUCOSE 140 08/18/2016   BUN 11.3 08/18/2016   CREATININE 0.8 08/18/2016   BILITOT 0.46 08/18/2016   ALKPHOS 124 08/18/2016   AST 22 08/18/2016   ALT 21 08/18/2016   PROT 8.3 08/18/2016   ALBUMIN 2.9 (L) 08/18/2016   CALCIUM 9.3 08/18/2016   GFRAA 139 07/31/2016    Speciality Comments: No specialty comments available.  Procedures:  No procedures performed Allergies: Patient has no known allergies.   Assessment / Plan:     Visit Diagnoses: Raynaud's disease without gangrene-her symptoms are not flaring as the weather has been warmer.  ANA positive-she has no clinical features of autoimmune disease.  Primary osteoarthritis of both knees-weight loss diet and exercise was discussed.  DDD (degenerative disc disease), cervical - Status post fusion of C5-C6 in 2002 by Dr. Ellene Route.  She has fairly good range of motion of her cervical spine.  Chronic pain syndrome - tramadol. UDS: 3/22/2019Narc agreement: 01/29/2017.  We had detailed discussion about switching from tramadol to Tylenol.  She states she will try.  She has been taking tramadol mostly for left trochanteric bursitis.  Medication management - Plan: Pain Mgmt, Profile 5 w/Conf, U, Pain Mgmt, Tramadol w/medMATCH, narcotic agreement was renewed.  Trochanteric bursitis of left hip-patient was referred to physical therapy.  History of vitamin D deficiency-she has been taking vitamin D.  Abnormal laboratory test - Abnormal IFE. She will follow-up with Dr. Marin Olp  History of hypertension  History of  hyperlipidemia  History of diabetes mellitus  History of asthma  History of gastroesophageal reflux (GERD)  Class 3 severe obesity due to excess calories without serious comorbidity with body mass index (BMI) of 50.0 to 59.9 in adult [facility])   Orders: Orders Placed This Encounter  Procedures  . Pain Mgmt, Profile 5 w/Conf, U  . Pain Mgmt, Tramadol w/medMATCH, U   No orders of the defined types were placed in this encounter.   Face-to-face time spent with patient was 30 minutes. Greater than 50% of time was spent in counseling and coordination of care.  Follow-Up Instructions: Return in about 6 months (around 08/03/2018) for Raynauds, DDD.   Bo Merino, MD  Note - This record has been created using Editor, commissioning.  Chart creation errors have been sought, but may not always  have been located. Such creation errors do not reflect on  the standard of medical care.

## 2018-02-02 ENCOUNTER — Ambulatory Visit: Payer: 59 | Admitting: Rheumatology

## 2018-02-02 ENCOUNTER — Encounter: Payer: Self-pay | Admitting: Rheumatology

## 2018-02-02 VITALS — BP 142/81 | HR 77 | Resp 18 | Ht 63.0 in | Wt 313.4 lb

## 2018-02-02 DIAGNOSIS — Z6841 Body Mass Index (BMI) 40.0 and over, adult: Secondary | ICD-10-CM

## 2018-02-02 DIAGNOSIS — R768 Other specified abnormal immunological findings in serum: Secondary | ICD-10-CM | POA: Diagnosis not present

## 2018-02-02 DIAGNOSIS — G894 Chronic pain syndrome: Secondary | ICD-10-CM

## 2018-02-02 DIAGNOSIS — Z8639 Personal history of other endocrine, nutritional and metabolic disease: Secondary | ICD-10-CM

## 2018-02-02 DIAGNOSIS — I7300 Raynaud's syndrome without gangrene: Secondary | ICD-10-CM

## 2018-02-02 DIAGNOSIS — M7062 Trochanteric bursitis, left hip: Secondary | ICD-10-CM

## 2018-02-02 DIAGNOSIS — Z8679 Personal history of other diseases of the circulatory system: Secondary | ICD-10-CM

## 2018-02-02 DIAGNOSIS — Z8719 Personal history of other diseases of the digestive system: Secondary | ICD-10-CM

## 2018-02-02 DIAGNOSIS — R899 Unspecified abnormal finding in specimens from other organs, systems and tissues: Secondary | ICD-10-CM

## 2018-02-02 DIAGNOSIS — M5030 Other cervical disc degeneration, unspecified cervical region: Secondary | ICD-10-CM | POA: Diagnosis not present

## 2018-02-02 DIAGNOSIS — M170 Bilateral primary osteoarthritis of knee: Secondary | ICD-10-CM

## 2018-02-02 DIAGNOSIS — Z8709 Personal history of other diseases of the respiratory system: Secondary | ICD-10-CM

## 2018-02-02 DIAGNOSIS — Z79899 Other long term (current) drug therapy: Secondary | ICD-10-CM

## 2018-02-02 DIAGNOSIS — E66813 Obesity, class 3: Secondary | ICD-10-CM

## 2018-02-02 NOTE — Patient Instructions (Signed)
Iliotibial Band Syndrome Rehab  Ask your health care provider which exercises are safe for you. Do exercises exactly as told by your health care provider and adjust them as directed. It is normal to feel mild stretching, pulling, tightness, or discomfort as you do these exercises, but you should stop right away if you feel sudden pain or your pain gets worse. Do not begin these exercises until told by your health care provider.  Stretching and range of motion exercises  These exercises warm up your muscles and joints and improve the movement and flexibility of your hip and pelvis.  Exercise A: Quadriceps, prone    1. Lie on your abdomen on a firm surface, such as a bed or padded floor.  2. Bend your left / right knee and hold your ankle. If you cannot reach your ankle or pant leg, loop a belt around your foot and grab the belt instead.  3. Gently pull your heel toward your buttocks. Your knee should not slide out to the side. You should feel a stretch in the front of your thigh and knee.  4. Hold this position for __________ seconds.  Repeat __________ times. Complete this stretch __________ times a day.  Exercise B: Iliotibial band    1. Lie on your side with your left / right leg in the top position.  2. Bend both of your knees and grab your left / right ankle. Stretch out your bottom arm to help you balance.  3. Slowly bring your top knee back so your thigh goes behind your trunk.  4. Slowly lower your top leg toward the floor until you feel a gentle stretch on the outside of your left / right hip and thigh. If you do not feel a stretch and your knee will not fall farther, place the heel of your other foot on top of your knee and pull your knee down toward the floor with your foot.  5. Hold this position for __________ seconds.  Repeat __________ times. Complete this stretch __________ times a day.  Strengthening exercises  These exercises build strength and endurance in your hip and pelvis. Endurance is the  ability to use your muscles for a long time, even after they get tired.  Exercise C: Straight leg raises (  hip abductors)  1. Lie on your side with your left / right leg in the top position. Lie so your head, shoulder, knee, and hip line up. You may bend your bottom knee to help you balance.  2. Roll your hips slightly forward so your hips are stacked directly over each other and your left / right knee is facing forward.  3. Tense the muscles in your outer thigh and lift your top leg 4-6 inches (10-15 cm).  4. Hold this position for __________ seconds.  5. Slowly return to the starting position. Let your muscles relax completely before doing another repetition.  Repeat __________ times. Complete this exercise __________ times a day.  Exercise D: Straight leg raises (  hip extensors)  1. Lie on your abdomen on your bed or a firm surface. You can put a pillow under your hips if that is more comfortable.  2. Bend your left / right knee so your foot is straight up in the air.  3. Squeeze your buttock muscles and lift your left / right thigh off the bed. Do not let your back arch.  4. Tense this muscle as hard as you can without increasing any knee pain.    5. Hold this position for __________ seconds.  6. Slowly lower your leg to the starting position and allow it to relax completely.  Repeat __________ times. Complete this exercise __________ times a day.  Exercise E: Hip hike  1. Stand sideways on a bottom step. Stand on your left / right leg with your other foot unsupported next to the step. You can hold onto the railing or wall if needed for balance.  2. Keep your knees straight and your torso square. Then, lift your left / right hip up toward the ceiling.  3. Slowly let your left / right hip lower toward the floor, past the starting position. Your foot should get closer to the floor. Do not lean or bend your knees.  Repeat __________ times. Complete this exercise __________ times a day.  This information is not  intended to replace advice given to you by your health care provider. Make sure you discuss any questions you have with your health care provider.  Document Released: 04/28/2005 Document Revised: 01/01/2016 Document Reviewed: 03/30/2015  Elsevier Interactive Patient Education © 2018 [employer]

## 2018-02-04 ENCOUNTER — Telehealth: Payer: Self-pay | Admitting: Rheumatology

## 2018-02-04 LAB — PAIN MGMT, PROFILE 5 W/CONF, U
Amphetamines: NEGATIVE ng/mL (ref <500)
BENZODIAZEPINES: NEGATIVE ng/mL (ref <100)
Barbiturates: NEGATIVE ng/mL (ref <300)
COCAINE METABOLITE: NEGATIVE ng/mL (ref <150)
Creatinine: 70.2 mg/dL
Marijuana Metabolite: NEGATIVE ng/mL (ref <20)
Methadone Metabolite: NEGATIVE ng/mL (ref <100)
OPIATES: NEGATIVE ng/mL (ref <100)
OXYCODONE: NEGATIVE ng/mL (ref <100)
Oxidant: POSITIVE ug/mL — AB (ref <200)
pH: 7.6 (ref 4.5–9.0)

## 2018-02-04 LAB — PAIN MGMT, TRAMADOL W/MEDMATCH, U
Desmethyltramadol: 7472 ng/mL — ABNORMAL HIGH (ref <100)
TRAMADOL: 7800 ng/mL — AB (ref <100)

## 2018-02-04 NOTE — Telephone Encounter (Signed)
Attempted to contact patient and left message for patient to call the office.  

## 2018-02-04 NOTE — Telephone Encounter (Signed)
Patient Returned your call

## 2018-02-04 NOTE — Telephone Encounter (Signed)
Patient states she had seen her UDS on my chart and noticed that her labs were abnormal. Patient advised that the her results are consistent with the medication she is taking. Patient advised that if her UDS did not show that she was taking the Tramadol in her system then it would be abnormal. Patient verbalized understanding.

## 2018-02-04 NOTE — Telephone Encounter (Signed)
Patient called requesting a return call regarding her lab results that were posted on mychart.

## 2018-02-16 ENCOUNTER — Telehealth: Payer: Self-pay | Admitting: Rheumatology

## 2018-02-16 NOTE — Telephone Encounter (Signed)
FYI: Patient has an abscessed tooth. Patient got an rx for Vicoden 5/3.25mg  from her dentist.

## 2018-03-10 ENCOUNTER — Other Ambulatory Visit: Payer: Self-pay | Admitting: Physician Assistant

## 2018-03-10 DIAGNOSIS — G894 Chronic pain syndrome: Secondary | ICD-10-CM

## 2018-03-11 NOTE — Telephone Encounter (Addendum)
Last visit: 02/02/18 Next visit: 08/03/18 UDS: 02/02/18 Narc Agreement: 02/02/18  Okay to refilltramadol? 

## 2018-05-17 ENCOUNTER — Other Ambulatory Visit: Payer: Self-pay | Admitting: Physician Assistant

## 2018-05-17 DIAGNOSIS — G894 Chronic pain syndrome: Secondary | ICD-10-CM

## 2018-05-17 NOTE — Telephone Encounter (Signed)
ok 

## 2018-05-17 NOTE — Telephone Encounter (Signed)
Last visit: 02/02/18 Next visit: 08/03/18 UDS: 02/02/18 Narc Agreement: 02/02/18  Okay to refill tramadol?

## 2018-07-20 NOTE — Progress Notes (Signed)
Office Visit Note  Patient: Miranda Ross             Date of Birth: [birth year]           MRN: [id number]             PCP: Delrae Rend, MD Referring: Delrae Rend, MD Visit Date: 08/03/2018 Occupation: @GUAROCC @  Subjective:  Left shoulder pain   History of Present Illness: Miranda Ross is a 43 y.o. female with history of Raynaud's, osteoarthritis, and DDD.  She presents today with left shoulder joint pain.  She states that the previous shoulder joint injection helped significantly and she would like a cortisone injection today.  She continues to have left trochanter bursitis but the pain has been manageable recently.  She continues have chronic neck pain but denies any symptoms of radiculopathy at this time.  She states that her knee joints have been doing well denies any joint swelling.  She has been using Voltaren gel topically for pain relief as well as taking tramadol as prescribed.  She has intermittent symptoms of Raynaud's but denies any digital ulcerations or signs of gangrene.  She continues to have bilateral CMC joint pain but denies any joint swelling.  She has been using Voltaren gel as well as massage which has been helping relieve the pain.    Activities of Daily Living:  Patient reports morning stiffness for 30-45  minutes.   Patient Reports nocturnal pain.  Difficulty dressing/grooming: Denies Difficulty climbing stairs: Denies Difficulty getting out of chair: Denies Difficulty using hands for taps, buttons, cutlery, and/or writing: Denies  Review of Systems  Constitutional: Positive for fatigue.  HENT: Negative for mouth sores, mouth dryness and nose dryness.   Eyes: Negative for pain, visual disturbance and dryness.  Respiratory: Negative for cough, hemoptysis, shortness of breath and difficulty breathing.   Cardiovascular: Negative for chest pain, palpitations, hypertension and swelling in legs/feet.  Gastrointestinal: Negative for blood in stool,  constipation and diarrhea.  Endocrine: Negative for increased urination.  Genitourinary: Negative for painful urination.  Musculoskeletal: Positive for arthralgias, joint pain and morning stiffness. Negative for joint swelling, myalgias, muscle weakness, muscle tenderness and myalgias.  Skin: Positive for color change. Negative for pallor, rash, hair loss, nodules/bumps, skin tightness, ulcers and sensitivity to sunlight.  Neurological: Negative for dizziness, numbness, headaches and weakness.  Hematological: Negative for swollen glands.  Psychiatric/Behavioral: Negative for depressed mood and sleep disturbance. The patient is not nervous/anxious.     PMFS History:  Patient Active Problem List   Diagnosis Date Noted  . Class 3 obesity with body mass index (BMI) of 50.0 to 59.9 in adult 01/29/2017  . Polyclonal gammopathy determined by serum protein electrophoresis 08/21/2016  . MGUS (monoclonal gammopathy of unknown significance) 08/18/2016  . History of asthma 07/28/2016  . Raynaud's disease without gangrene 07/24/2016  . Primary osteoarthritis of both knees 07/24/2016  . DJD (degenerative joint disease), cervical 07/24/2016  . History of gastroesophageal reflux (GERD) 07/24/2016  . ANA positive 07/24/2016  . Trochanteric bursitis of both hips 07/24/2016  . Hyperlipidemia 06/18/2013  . Hematuria 06/18/2013    Past Medical History:  Diagnosis Date  . Arthritis   . Asthma    uses albuterol inh prn  . Diabetes mellitus without complication (HCC)    NIDDM  . PONV (postoperative nausea and vomiting)   . Sleep apnea    sleep study 2013, didnt finish study, left early    Family History  Problem Relation Age of  Onset  . Diabetes Mother   . Heart disease Mother   . Depression Mother   . Diverticulosis Mother   . Non-Hodgkin's lymphoma Father   . Scleroderma Sister    Past Surgical History:  Procedure Laterality Date  . BACK SURGERY     cervical neck fusion 2002  . FOOT  SURGERY    . MASS EXCISION Left 09/02/2012   Procedure: MINOR EXCISION OF CYST LEFT RING FINGER A-3 PULLEY;  Surgeon: Cammie Sickle., MD;  Location: [facility];  Service: Orthopedics;  Laterality: Left;  . neck fusion    . OOPHORECTOMY    . right hand surgery    . WRIST SURGERY     TFCC repair 2003   Social History   Social History Narrative  . Not on file    There is no immunization history on file for this patient.   Objective: Vital Signs: BP 134/87 (BP Location: Left Arm, Patient Position: Sitting, Cuff Size: Normal)   Pulse 83   Resp 18   Ht 5\' 3"  (1.6 m)   Wt 292 lb (132.5 kg)   BMI 51.73 kg/m    Physical Exam Vitals signs and nursing note reviewed.  Constitutional:      Appearance: She is well-developed.  HENT:     Head: Normocephalic and atraumatic.  Eyes:     Conjunctiva/sclera: Conjunctivae normal.  Neck:     Musculoskeletal: Normal range of motion.  Cardiovascular:     Rate and Rhythm: Normal rate and regular rhythm.     Heart sounds: Normal heart sounds.  Pulmonary:     Effort: Pulmonary effort is normal.     Breath sounds: Normal breath sounds.  Abdominal:     General: Bowel sounds are normal.     Palpations: Abdomen is soft.  Lymphadenopathy:     Cervical: No cervical adenopathy.  Skin:    General: Skin is warm and dry.     Capillary Refill: Capillary refill takes less than 2 seconds.  Neurological:     Mental Status: She is alert and oriented to person, place, and time.  Psychiatric:        Behavior: Behavior normal.      Musculoskeletal Exam: C-spine slightly limited range of motion.  Thoracic and lumbar spine good range of motion.  No midline spinal tenderness.  No SI joint tenderness.  Shoulder joints, elbow joints, strength, MCPs, PIPs, DIPs good range of motion no synovitis.  She complains laceration bilaterally.  She has tenderness of bilateral CMC joints.  Hip joints have good range of motion with no discomfort.   Tenderness over the left trochanteric bursa.  Knee joints have good range of motion with no warmth or effusion.  She has bilateral knee crepitus.  No tenderness or swelling of ankle joints.  CDAI Exam: CDAI Score: Not documented Patient Global Assessment: Not documented; Provider Global Assessment: Not documented Swollen: Not documented; Tender: Not documented Joint Exam   Not documented   There is currently no information documented on the homunculus. Go to the Rheumatology activity and complete the homunculus joint exam.  Investigation: No additional findings.  Imaging: No results found.  Recent Labs: Lab Results  Component Value Date   WBC 12.1 (H) 08/18/2016   HGB 13.6 08/18/2016   PLT 325 08/18/2016   NA 136 08/18/2016   K 3.9 08/18/2016   CL 96 07/31/2016   CO2 26 08/18/2016   GLUCOSE 140 08/18/2016   BUN 11.3 08/18/2016  CREATININE 0.8 08/18/2016   BILITOT 0.46 08/18/2016   ALKPHOS 124 08/18/2016   AST 22 08/18/2016   ALT 21 08/18/2016   PROT 8.3 08/18/2016   ALBUMIN 2.9 (L) 08/18/2016   CALCIUM 9.3 08/18/2016   GFRAA 139 07/31/2016    Speciality Comments: No specialty comments available.  Procedures:  Large Joint Inj: L glenohumeral on 08/03/2018 9:48 AM Indications: pain Details: 27 G 1.5 in needle, posterior approach  Arthrogram: No  Medications: 1 mL lidocaine 1 %; 40 mg triamcinolone acetonide 40 MG/ML Aspirate: 0 mL Outcome: tolerated well, no immediate complications Procedure, treatment alternatives, risks and benefits explained, specific risks discussed. Consent was given by the patient. Immediately prior to procedure a time out was called to verify the correct patient, procedure, equipment, support staff and site/side marked as required. Patient was prepped and draped in the usual sterile fashion.     Allergies: Patient has no known allergies.  She has intermittent symptoms of Raynaud's.  She has no digital ulcerations or signs of gangrene.   She was encouraged to keep her core body temperature warm to wear gloves.    She has no other clinical features of autoimmune disease.    No warmth or effusion.  She has good ROM with no discomfort.  She has bilateral knee joint crepitus.  She has no joint pain at this time.  She has occasional discomfort when going up and down steps. She uses voltaren gel topically PRN and takes tramadol for pain relief.   She has slightly limited ROM with no discomfort at this time.  She has intermittent pain and stiffness.  She does not experiences symptoms of radiculopathy.     UDS and narcotic agreement were updated today on 08/03/18.    She presents today with left shoulder joint pain.  She has good ROM on exam with some discomfort.  She denies any recent injuries.  She has been having pain wake her up at night in the left shoulder joint.  She had a cortisone injection on 08/07/17 that provided significant pain relief. X-rays of the left shoulder were obtained today. She requested a left shoulder cortisone injection today.  She tolerated the procedure well.  Procedure note completed above.  She was given a handout of shoulder exercises.     She has tenderness over the left trochanteric bursa.  The pain has been manageable.   Assessment / Plan:     Visit Diagnoses: Raynaud's disease without gangrene  ANA positive  Primary osteoarthritis of both knees  DDD (degenerative disc disease), cervical - Status post fusion of C5-C6 in 2002 by Dr. Ellene Route.   Chronic pain syndrome - Tramadol - Plan: Pain Mgmt, Tramadol w/medMATCH, U, Pain Mgmt, Profile 5 w/Conf, U  Medication monitoring encounter - Plan: Pain Mgmt, Tramadol w/medMATCH, U, Pain Mgmt, Profile 5 w/Conf, U  Chronic left shoulder pain - Plan: Large Joint Inj: L glenohumeral, XR Shoulder Left  Trochanteric bursitis of left hip  History of vitamin D deficiency  History of hypertension  History of hyperlipidemia  History of diabetes  mellitus  History of asthma  History of gastroesophageal reflux (GERD)   Orders: Orders Placed This Encounter  Procedures  . Large Joint Inj: L glenohumeral  . XR Shoulder Left  . Pain Mgmt, Tramadol w/medMATCH, U  . Pain Mgmt, Profile 5 w/Conf, U   No orders of the defined types were placed in this encounter.    Follow-Up Instructions: Return in about 6 months (around 02/03/2019) for Raynaud's  syndrome, Osteoarthritis, DDD.   Ofilia Neas, PA-C   I examined and evaluated the patient with Hazel Sams PA. The plan of care was discussed as noted above.  Bo Merino, MD  Note - This record has been created using Editor, commissioning.  Chart creation errors have been sought, but may not always  have been located. Such creation errors do not reflect on  the standard of medical care.

## 2018-07-21 ENCOUNTER — Other Ambulatory Visit: Payer: Self-pay | Admitting: Rheumatology

## 2018-07-21 DIAGNOSIS — G894 Chronic pain syndrome: Secondary | ICD-10-CM

## 2018-07-22 NOTE — Telephone Encounter (Signed)
ok 

## 2018-07-22 NOTE — Telephone Encounter (Signed)
Last visit: 02/02/18 Next visit: 08/03/18 UDS: 02/02/18 Narc Agreement: 02/02/18  Okay to refilltramadol?

## 2018-08-03 ENCOUNTER — Other Ambulatory Visit: Payer: Self-pay

## 2018-08-03 ENCOUNTER — Ambulatory Visit (INDEPENDENT_AMBULATORY_CARE_PROVIDER_SITE_OTHER): Payer: 59

## 2018-08-03 ENCOUNTER — Ambulatory Visit: Payer: 59 | Admitting: Physician Assistant

## 2018-08-03 ENCOUNTER — Encounter: Payer: Self-pay | Admitting: Physician Assistant

## 2018-08-03 VITALS — BP 134/87 | HR 83 | Resp 18 | Ht 63.0 in | Wt 292.0 lb

## 2018-08-03 DIAGNOSIS — R7689 Other specified abnormal immunological findings in serum: Secondary | ICD-10-CM

## 2018-08-03 DIAGNOSIS — Z8719 Personal history of other diseases of the digestive system: Secondary | ICD-10-CM

## 2018-08-03 DIAGNOSIS — Z8679 Personal history of other diseases of the circulatory system: Secondary | ICD-10-CM

## 2018-08-03 DIAGNOSIS — M25512 Pain in left shoulder: Secondary | ICD-10-CM

## 2018-08-03 DIAGNOSIS — M7062 Trochanteric bursitis, left hip: Secondary | ICD-10-CM

## 2018-08-03 DIAGNOSIS — G8929 Other chronic pain: Secondary | ICD-10-CM | POA: Diagnosis not present

## 2018-08-03 DIAGNOSIS — Z5181 Encounter for therapeutic drug level monitoring: Secondary | ICD-10-CM

## 2018-08-03 DIAGNOSIS — M5030 Other cervical disc degeneration, unspecified cervical region: Secondary | ICD-10-CM

## 2018-08-03 DIAGNOSIS — M170 Bilateral primary osteoarthritis of knee: Secondary | ICD-10-CM | POA: Diagnosis not present

## 2018-08-03 DIAGNOSIS — Z8639 Personal history of other endocrine, nutritional and metabolic disease: Secondary | ICD-10-CM

## 2018-08-03 DIAGNOSIS — I7300 Raynaud's syndrome without gangrene: Secondary | ICD-10-CM

## 2018-08-03 DIAGNOSIS — R768 Other specified abnormal immunological findings in serum: Secondary | ICD-10-CM | POA: Diagnosis not present

## 2018-08-03 DIAGNOSIS — G894 Chronic pain syndrome: Secondary | ICD-10-CM

## 2018-08-03 DIAGNOSIS — Z8709 Personal history of other diseases of the respiratory system: Secondary | ICD-10-CM

## 2018-08-03 MED ORDER — TRIAMCINOLONE ACETONIDE 40 MG/ML IJ SUSP
40.0000 mg | INTRAMUSCULAR | Status: AC | PRN
  Administered 2018-08-03: 40 mg via INTRA_ARTICULAR

## 2018-08-03 MED ORDER — LIDOCAINE HCL 1 % IJ SOLN
1.0000 mL | INTRAMUSCULAR | Status: AC | PRN
  Administered 2018-08-03: 1 mL

## 2018-08-03 NOTE — Patient Instructions (Signed)
Shoulder Exercises Ask your health care provider which exercises are safe for you. Do exercises exactly as told by your health care provider and adjust them as directed. It is normal to feel mild stretching, pulling, tightness, or discomfort as you do these exercises, but you should stop right away if you feel sudden pain or your pain gets worse.Do not begin these exercises until told by your health care provider. Range of Motion Exercises        These exercises warm up your muscles and joints and improve the movement and flexibility of your shoulder. These exercises also help to relieve pain, numbness, and tingling. These exercises involve stretching your injured shoulder directly. Exercise A: Pendulum 1. Stand near a wall or a surface that you can hold onto for balance. 2. Bend at the waist and let your left / right arm hang straight down. Use your other arm to support you. Keep your back straight and do not lock your knees. 3. Relax your left / right arm and shoulder muscles, and move your hips and your trunk so your left / right arm swings freely. Your arm should swing because of the motion of your body, not because you are using your arm or shoulder muscles. 4. Keep moving your body so your arm swings in the following directions, as told by your health care provider: ? Side to side. ? Forward and backward. ? In clockwise and counterclockwise circles. 5. Continue each motion for __________ seconds, or for as long as told by your health care provider. 6. Slowly return to the starting position. Repeat __________ times. Complete this exercise __________ times a day. Exercise B:Flexion, Standing 1. Stand and hold a broomstick, a cane, or a similar object. Place your hands a little more than shoulder-width apart on the object. Your left / right hand should be palm-up, and your other hand should be palm-down. 2. Keep your elbow straight and keep your shoulder muscles relaxed. Push the stick  down with your healthy arm to raise your left / right arm in front of your body, and then over your head until you feel a stretch in your shoulder. ? Avoid shrugging your shoulder while you raise your arm. Keep your shoulder blade tucked down toward the middle of your back. 3. Hold for __________ seconds. 4. Slowly return to the starting position. Repeat __________ times. Complete this exercise __________ times a day. Exercise C: Abduction, Standing 1. Stand and hold a broomstick, a cane, or a similar object. Place your hands a little more than shoulder-width apart on the object. Your left / right hand should be palm-up, and your other hand should be palm-down. 2. While keeping your elbow straight and your shoulder muscles relaxed, push the stick across your body toward your left / right side. Raise your left / right arm to the side of your body and then over your head until you feel a stretch in your shoulder. ? Do not raise your arm above shoulder height, unless your health care provider tells you to do that. ? Avoid shrugging your shoulder while you raise your arm. Keep your shoulder blade tucked down toward the middle of your back. 3. Hold for __________ seconds. 4. Slowly return to the starting position. Repeat __________ times. Complete this exercise __________ times a day. Exercise D:Internal Rotation 1. Place your left / right hand behind your back, palm-up. 2. Use your other hand to dangle an exercise band, a towel, or a similar object over your shoulder.   Grasp the band with your left / right hand so you are holding onto both ends. 3. Gently pull up on the band until you feel a stretch in the front of your left / right shoulder. ? Avoid shrugging your shoulder while you raise your arm. Keep your shoulder blade tucked down toward the middle of your back. 4. Hold for __________ seconds. 5. Release the stretch by letting go of the band and lowering your hands. Repeat __________ times.  Complete this exercise __________ times a day. Stretching Exercises  These exercises warm up your muscles and joints and improve the movement and flexibility of your shoulder. These exercises also help to relieve pain, numbness, and tingling. These exercises are done using your healthy shoulder to help stretch the muscles of your injured shoulder. Exercise E: Corner Stretch (External Rotation and Abduction) 1. Stand in a doorway with one of your feet slightly in front of the other. This is called a staggered stance. If you cannot reach your forearms to the door frame, stand facing a corner of a room. 2. Choose one of the following positions as told by your health care provider: ? Place your hands and forearms on the door frame above your head. ? Place your hands and forearms on the door frame at the height of your head. ? Place your hands on the door frame at the height of your elbows. 3. Slowly move your weight onto your front foot until you feel a stretch across your chest and in the front of your shoulders. Keep your head and chest upright and keep your abdominal muscles tight. 4. Hold for __________ seconds. 5. To release the stretch, shift your weight to your back foot. Repeat __________ times. Complete this stretch __________ times a day. Exercise F:Extension, Standing 1. Stand and hold a broomstick, a cane, or a similar object behind your back. ? Your hands should be a little wider than shoulder-width apart. ? Your palms should face away from your back. 2. Keeping your elbows straight and keeping your shoulder muscles relaxed, move the stick away from your body until you feel a stretch in your shoulder. ? Avoid shrugging your shoulders while you move the stick. Keep your shoulder blade tucked down toward the middle of your back. 3. Hold for __________ seconds. 4. Slowly return to the starting position. Repeat __________ times. Complete this exercise __________ times a  day. Strengthening Exercises           These exercises build strength and endurance in your shoulder. Endurance is the ability to use your muscles for a long time, even after they get tired. Exercise G:External Rotation 1. Sit in a stable chair without armrests. 2. Secure an exercise band at elbow height on your left / right side. 3. Place a soft object, such as a folded towel or a small pillow, between your left / right upper arm and your body to move your elbow a few inches away (about 10 cm) from your side. 4. Hold the end of the band so it is tight and there is no slack. 5. Keeping your elbow pressed against the soft object, move your left / right forearm out, away from your abdomen. Keep your body steady so only your forearm moves. 6. Hold for __________ seconds. 7. Slowly return to the starting position. Repeat __________ times. Complete this exercise __________ times a day. Exercise H:Shoulder Abduction 1. Sit in a stable chair without armrests, or stand. 2. Hold a __________ weight in your   left / right hand, or hold an exercise band with both hands. 3. Start with your arms straight down and your left / right palm facing in, toward your body. 4. Slowly lift your left / right hand out to your side. Do not lift your hand above shoulder height unless your health care provider tells you that this is safe. ? Keep your arms straight. ? Avoid shrugging your shoulder while you do this movement. Keep your shoulder blade tucked down toward the middle of your back. 5. Hold for __________ seconds. 6. Slowly lower your arm, and return to the starting position. Repeat __________ times. Complete this exercise __________ times a day. Exercise I:Shoulder Extension 1. Sit in a stable chair without armrests, or stand. 2. Secure an exercise band to a stable object in front of you where it is at shoulder height. 3. Hold one end of the exercise band in each hand. Your palms should face each  other. 4. Straighten your elbows and lift your hands up to shoulder height. 5. Step back, away from the secured end of the exercise band, until the band is tight and there is no slack. 6. Squeeze your shoulder blades together as you pull your hands down to the sides of your thighs. Stop when your hands are straight down by your sides. Do not let your hands go behind your body. 7. Hold for __________ seconds. 8. Slowly return to the starting position. Repeat __________ times. Complete this exercise __________ times a day. Exercise J:Standing Shoulder Row 1. Sit in a stable chair without armrests, or stand. 2. Secure an exercise band to a stable object in front of you so it is at waist height. 3. Hold one end of the exercise band in each hand. Your palms should be in a thumbs-up position. 4. Bend each of your elbows to an "L" shape (about 90 degrees) and keep your upper arms at your sides. 5. Step back until the band is tight and there is no slack. 6. Slowly pull your elbows back behind you. 7. Hold for __________ seconds. 8. Slowly return to the starting position. Repeat __________ times. Complete this exercise __________ times a day. Exercise K:Shoulder Press-Ups 1. Sit in a stable chair that has armrests. Sit upright, with your feet flat on the floor. 2. Put your hands on the armrests so your elbows are bent and your fingers are pointing forward. Your hands should be about even with the sides of your body. 3. Push down on the armrests and use your arms to lift yourself off of the chair. Straighten your elbows and lift yourself up as much as you comfortably can. ? Move your shoulder blades down, and avoid letting your shoulders move up toward your ears. ? Keep your feet on the ground. As you get stronger, your feet should support less of your body weight as you lift yourself up. 4. Hold for __________ seconds. 5. Slowly lower yourself back into the chair. Repeat __________ times. Complete  this exercise __________ times a day. Exercise L: Wall Push-Ups 1. Stand so you are facing a stable wall. Your feet should be about one arm-length away from the wall. 2. Lean forward and place your palms on the wall at shoulder height. 3. Keep your feet flat on the floor as you bend your elbows and lean forward toward the wall. 4. Hold for __________ seconds. 5. Straighten your elbows to push yourself back to the starting position. Repeat __________ times. Complete this exercise __________ times   a day. This information is not intended to replace advice given to you by your health care provider. Make sure you discuss any questions you have with your health care provider. Document Released: 03/12/2005 Document Revised: 09/01/2017 Document Reviewed: 01/07/2015 Elsevier Interactive Patient Education  2019 [employer]  

## 2018-08-05 LAB — PAIN MGMT, PROFILE 5 W/CONF, U
Amphetamines: NEGATIVE ng/mL (ref <500)
Barbiturates: NEGATIVE ng/mL (ref <300)
Benzodiazepines: NEGATIVE ng/mL (ref <100)
CREATININE: 56.2 mg/dL
Cocaine Metabolite: NEGATIVE ng/mL (ref <150)
Marijuana Metabolite: NEGATIVE ng/mL (ref <20)
Methadone Metabolite: NEGATIVE ng/mL (ref <100)
Opiates: NEGATIVE ng/mL (ref <100)
Oxidant: NEGATIVE ug/mL (ref <200)
Oxycodone: NEGATIVE ng/mL (ref <100)
PH: 6.81 (ref 4.5–9.0)

## 2018-08-05 LAB — PAIN MGMT, TRAMADOL W/MEDMATCH, U
DESMETHYLTRAMADOL: 874 ng/mL — AB (ref <100)
TRAMADOL: 635 ng/mL — AB (ref <100)

## 2018-08-05 NOTE — Progress Notes (Signed)
UDS is consistent with treatment.

## 2018-10-12 ENCOUNTER — Other Ambulatory Visit: Payer: Self-pay | Admitting: Physician Assistant

## 2018-10-18 ENCOUNTER — Other Ambulatory Visit: Payer: Self-pay | Admitting: Rheumatology

## 2018-10-18 DIAGNOSIS — G894 Chronic pain syndrome: Secondary | ICD-10-CM

## 2018-10-19 NOTE — Telephone Encounter (Signed)
Last Visit: 08/03/2018 Next Visit: 02/03/2019 UDS: 08/03/2018 c/w Narc Agreement: 08/03/2018  Last fill: 07/22/2018  Okay to refill tramadol?

## 2018-10-19 NOTE — Telephone Encounter (Signed)
ok 

## 2018-12-21 ENCOUNTER — Other Ambulatory Visit: Payer: Self-pay | Admitting: Rheumatology

## 2018-12-21 DIAGNOSIS — G894 Chronic pain syndrome: Secondary | ICD-10-CM

## 2018-12-21 NOTE — Telephone Encounter (Signed)
Last Visit: 08/03/2018 Next Visit: 02/03/2019 UDS: 08/03/2018 c/w Narc Agreement: 08/03/2018  Last fill: 10/19/2018  Okay to refill tramadol?

## 2019-01-20 NOTE — Progress Notes (Signed)
Office Visit Note  Patient: Miranda Ross             Date of Birth: Nov 10, 1975           MRN: [id number]             PCP: Delrae Rend, MD Referring: Delrae Rend, MD Visit Date: 02/03/2019 Occupation: @GUAROCC @  Subjective:  Left shoulder joint pain   History of Present Illness: Miranda Ross is a 43 y.o. female with history of Raynolds, positive ANA, and osteoarthritis.  Patient takes tramadol 50 mg 1 tablet by mouth twice daily as needed for pain relief and uses Voltaren gel topically as needed.  She presents today with left shoulder joint pain and left trochanteric bursitis.  She had a left shoulder joint injection on 08/03/18 which provided significant relief.  She states that the left shoulder pain returned about 1 to 2 weeks ago.  She states she has been having left trochanteric bursitis for the past 3 to 4 weeks.  She currently rates her overall pain is 5 out of 10.  She reports has been having CMC joint pain bilaterally.  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  She uses Voltaren gel topically as needed for pain relief.  She denies any joint swelling.  She continues to get symptoms of Raynaud's intermittently    Activities of Daily Living:  Patient reports morning stiffness for 1 hour.   Patient Reports nocturnal pain.  Difficulty dressing/grooming: Denies Difficulty climbing stairs: Reports Difficulty getting out of chair: Reports Difficulty using hands for taps, buttons, cutlery, and/or writing: Reports  Review of Systems  Constitutional: Negative for fatigue.  HENT: Negative for mouth sores, mouth dryness and nose dryness.   Eyes: Negative for itching and dryness.  Respiratory: Negative for shortness of breath, wheezing and difficulty breathing.   Cardiovascular: Negative for chest pain and palpitations.  Gastrointestinal: Negative for abdominal pain, blood in stool, constipation and diarrhea.  Endocrine: Negative for increased  urination.  Genitourinary: Negative for difficulty urinating and painful urination.  Musculoskeletal: Positive for arthralgias, joint pain and morning stiffness. Negative for joint swelling.  Skin: Positive for hair loss. Negative for rash.  Allergic/Immunologic: Negative for susceptible to infections.  Neurological: Positive for headaches. Negative for dizziness, light-headedness, numbness, memory loss and weakness.  Hematological: Negative for bruising/bleeding tendency.  Psychiatric/Behavioral: Positive for sleep disturbance. Negative for confusion.    PMFS History:  Patient Active Problem List   Diagnosis Date Noted   Class 3 obesity with body mass index (BMI) of 50.0 to 59.9 in adult 01/29/2017   Polyclonal gammopathy determined by serum protein electrophoresis 08/21/2016   MGUS (monoclonal gammopathy of unknown significance) 08/18/2016   History of asthma 07/28/2016   Raynaud's disease without gangrene 07/24/2016   Primary osteoarthritis of both knees 07/24/2016   DJD (degenerative joint disease), cervical 07/24/2016   History of gastroesophageal reflux (GERD) 07/24/2016   ANA positive 07/24/2016   Trochanteric bursitis of both hips 07/24/2016   Hyperlipidemia 06/18/2013   Hematuria 06/18/2013    Past Medical History:  Diagnosis Date   Arthritis    Asthma    uses albuterol inh prn   Diabetes mellitus without complication (HCC)    NIDDM   PONV (postoperative nausea and vomiting)    Sleep apnea    sleep study 2013, didnt finish study, left early    Family History  Problem Relation Age of Onset   Diabetes Mother  Heart disease Mother    Depression Mother    Diverticulosis Mother    Non-Hodgkin's lymphoma Father    Scleroderma Sister    Past Surgical History:  Procedure Laterality Date   BACK SURGERY     cervical neck fusion 2002   FOOT SURGERY     MASS EXCISION Left 09/02/2012   Procedure: MINOR EXCISION OF CYST LEFT RING FINGER A-3  PULLEY;  Surgeon: Cammie Sickle., MD;  Location: [place];  Service: Orthopedics;  Laterality: Left;   neck fusion     OOPHORECTOMY     right hand surgery     WRIST SURGERY     TFCC repair 2003   Social History   Social History Narrative   Not on file    There is no immunization history on file for this patient.   Objective: Vital Signs: BP (!) 149/80 (BP Location: Left Arm, Patient Position: Sitting, Cuff Size: Large)    Pulse 67    Resp 15    Ht 5\' 3"  (1.6 m)    Wt 287 lb 9.6 oz (130.5 kg)    BMI 50.95 kg/m    Physical Exam Vitals signs and nursing note reviewed.  Constitutional:      Appearance: She is well-developed.  HENT:     Head: Normocephalic and atraumatic.  Eyes:     Conjunctiva/sclera: Conjunctivae normal.  Neck:     Musculoskeletal: Normal range of motion.  Cardiovascular:     Rate and Rhythm: Normal rate and regular rhythm.     Heart sounds: Normal heart sounds.  Pulmonary:     Effort: Pulmonary effort is normal.     Breath sounds: Normal breath sounds.  Abdominal:     General: Bowel sounds are normal.     Palpations: Abdomen is soft.  Lymphadenopathy:     Cervical: No cervical adenopathy.  Skin:    General: Skin is warm and dry.     Capillary Refill: Capillary refill takes less than 2 seconds.  Neurological:     Mental Status: She is alert and oriented to person, place, and time.  Psychiatric:        Behavior: Behavior normal.      Musculoskeletal Exam: C-spine, thoracic spine, lumbar spine good range of motion.  No midline spinal tenderness.  No SI joint tenderness.  Right shoulder full range of motion with no discomfort.  Left shoulder has full range of motion with discomfort. elbow joints, wrist joints, MCPs, PIPs, DIPs good range of motion with no synovitis.  Hip joints, knee joints, ankle joints, MTPs, PIPs, DIPs good range of motion no synovitis.  No warmth or effusion of bilateral knee joints.  No tenderness or  swelling of ankle joints.  She has tenderness over the left trochanteric bursa.  CDAI Exam: CDAI Score: -- Patient Global: --; Provider Global: -- Swollen: --; Tender: -- Joint Exam   No joint exam has been documented for this visit   There is currently no information documented on the homunculus. Go to the Rheumatology activity and complete the homunculus joint exam.  Investigation: No additional findings.  Imaging: No results found.  Recent Labs: Lab Results  Component Value Date   WBC 12.1 (H) 08/18/2016   HGB 13.6 08/18/2016   PLT 325 08/18/2016   NA 136 08/18/2016   K 3.9 08/18/2016   CL 96 07/31/2016   CO2 26 08/18/2016   GLUCOSE 140 08/18/2016   BUN 11.3 08/18/2016   CREATININE 0.8 08/18/2016  BILITOT 0.46 08/18/2016   ALKPHOS 124 08/18/2016   AST 22 08/18/2016   ALT 21 08/18/2016   PROT 8.3 08/18/2016   ALBUMIN 2.9 (L) 08/18/2016   CALCIUM 9.3 08/18/2016   GFRAA 139 07/31/2016    Speciality Comments: No specialty comments available.  Procedures:  Large Joint Inj: L glenohumeral on 02/03/2019 10:43 AM Indications: pain Details: 27 G 1.5 in needle, posterior approach  Arthrogram: No  Medications: 1.5 mL lidocaine 1 %; 40 mg triamcinolone acetonide 40 MG/ML Aspirate: 0 mL Outcome: tolerated well, no immediate complications Procedure, treatment alternatives, risks and benefits explained, specific risks discussed. Consent was given by the patient. Immediately prior to procedure a time out was called to verify the correct patient, procedure, equipment, support staff and site/side marked as required. Patient was prepped and draped in the usual sterile fashion.   Large Joint Inj: L greater trochanter on 02/03/2019 10:44 AM Indications: pain Details: 27 G 1.5 in needle, lateral approach  Arthrogram: No  Medications: 1 mL lidocaine 1 %; 40 mg triamcinolone acetonide 40 MG/ML Aspirate: 0 mL Outcome: tolerated well, no immediate complications Procedure,  treatment alternatives, risks and benefits explained, specific risks discussed. Consent was given by the patient. Immediately prior to procedure a time out was called to verify the correct patient, procedure, equipment, support staff and site/side marked as required. Patient was prepped and draped in the usual sterile fashion.     Allergies: Patient has no known allergies.   Assessment / Plan:     Visit Diagnoses: Raynaud's disease without gangrene: She has intermittent symptoms of rainouts.  No digital ulcerations or signs of gangrene were noted.  We discussed importance of keeping her core body temperature warm, drinking warm liquids, avoiding triggers, and using thick socks and gloves.  She is advised to notify us if she develops any signs of ulcerations.  ANA positive: She has no other clinical features of autoimmune disease at this time.  ANA titer was negative so we will not repeat labs at this time.  She was advised to notify us if she develops any new or worsening symptoms.  Primary osteoarthritis of both knees: She has good range of motion of bilateral knee joints with no discomfort.  No warmth or effusion was noted.  She has bilateral knee crepitus.  She has no difficulty with ADLs.  DDD (degenerative disc disease), cervical - Status post fusion of C5-C6 in 2002 by Dr. Ellene Route.  She has C-spine limited range of motion with no discomfort at this time.  She has no symptoms of radiculopathy.  Chronic pain syndrome -she takes tramadol 50 mg 1 tablet twice daily PRN for pain relief.  UDS and narcotic agreement were updated today on 02/03/2019.- Plan: Pain Mgmt, Profile 5 w/Conf, U, Pain Mgmt, Tramadol w/medMATCH, U  Medication monitoring encounter -UDS and narcotic agreement were updated today.  Plan: Pain Mgmt, Profile 5 w/Conf, U, Pain Mgmt, Tramadol w/medMATCH, U  Chronic left shoulder pain: She had x-rays of the left shoulder joint on 08/03/2018 which were unremarkable.  She experiences  intermittent left shoulder joint pain.  She had a cortisone injection on 08/03/2018 which provided significant relief until about 1 to 2 weeks ago.  She requested a cortisone injection today.  She tolerated procedure well.  The procedure note was completed above.  Trochanteric bursitis of left hip: She has tenderness over the left trochanteric bursa on exam.  She had a cortisone injection on 07/31/17.  She requested a cortisone injection today.  She  tolerated the procedure well.  The procedure note was completed above.  She was advised to monitor blood pressure following the cortisone injection.  She was also encouraged warm stretching exercises on a regular basis.  Other medical conditions are listed as follows:  History of vitamin D deficiency  History of gastroesophageal reflux (GERD)  History of hyperlipidemia  History of asthma  History of hypertension  History of diabetes mellitus    Orders: Orders Placed This Encounter  Procedures   Large Joint Inj   Large Joint Inj   Pain Mgmt, Profile 5 w/Conf, U   Pain Mgmt, Tramadol w/medMATCH, U   No orders of the defined types were placed in this encounter.   Face-to-face time spent with patient was 30 minutes. Greater than 50% of time was spent in counseling and coordination of care.  Follow-Up Instructions: Return in about 6 months (around 08/03/2019) for Raynaud's syndrome, Osteoarthritis.   Ofilia Neas, PA-C  Note - This record has been created using Dragon software.  Chart creation errors have been sought, but may not always  have been located. Such creation errors do not reflect on  the standard of medical care.

## 2019-02-03 ENCOUNTER — Ambulatory Visit: Payer: 59 | Admitting: Physician Assistant

## 2019-02-03 ENCOUNTER — Encounter: Payer: Self-pay | Admitting: Physician Assistant

## 2019-02-03 ENCOUNTER — Other Ambulatory Visit: Payer: Self-pay

## 2019-02-03 VITALS — BP 149/80 | HR 67 | Resp 15 | Ht 63.0 in | Wt 287.6 lb

## 2019-02-03 DIAGNOSIS — G8929 Other chronic pain: Secondary | ICD-10-CM | POA: Diagnosis not present

## 2019-02-03 DIAGNOSIS — I7300 Raynaud's syndrome without gangrene: Secondary | ICD-10-CM

## 2019-02-03 DIAGNOSIS — M7062 Trochanteric bursitis, left hip: Secondary | ICD-10-CM

## 2019-02-03 DIAGNOSIS — Z8679 Personal history of other diseases of the circulatory system: Secondary | ICD-10-CM

## 2019-02-03 DIAGNOSIS — R768 Other specified abnormal immunological findings in serum: Secondary | ICD-10-CM | POA: Diagnosis not present

## 2019-02-03 DIAGNOSIS — Z8639 Personal history of other endocrine, nutritional and metabolic disease: Secondary | ICD-10-CM

## 2019-02-03 DIAGNOSIS — M5030 Other cervical disc degeneration, unspecified cervical region: Secondary | ICD-10-CM

## 2019-02-03 DIAGNOSIS — M170 Bilateral primary osteoarthritis of knee: Secondary | ICD-10-CM | POA: Diagnosis not present

## 2019-02-03 DIAGNOSIS — Z8709 Personal history of other diseases of the respiratory system: Secondary | ICD-10-CM

## 2019-02-03 DIAGNOSIS — Z8719 Personal history of other diseases of the digestive system: Secondary | ICD-10-CM

## 2019-02-03 DIAGNOSIS — M25512 Pain in left shoulder: Secondary | ICD-10-CM

## 2019-02-03 DIAGNOSIS — G894 Chronic pain syndrome: Secondary | ICD-10-CM

## 2019-02-03 DIAGNOSIS — Z5181 Encounter for therapeutic drug level monitoring: Secondary | ICD-10-CM

## 2019-02-03 MED ORDER — TRIAMCINOLONE ACETONIDE 40 MG/ML IJ SUSP
40.0000 mg | INTRAMUSCULAR | Status: AC | PRN
  Administered 2019-02-03: 40 mg via INTRA_ARTICULAR

## 2019-02-03 MED ORDER — LIDOCAINE HCL 1 % IJ SOLN
1.5000 mL | INTRAMUSCULAR | Status: AC | PRN
  Administered 2019-02-03: 1.5 mL

## 2019-02-03 MED ORDER — LIDOCAINE HCL 1 % IJ SOLN
1.0000 mL | INTRAMUSCULAR | Status: AC | PRN
  Administered 2019-02-03: 1 mL

## 2019-02-05 LAB — PAIN MGMT, PROFILE 5 W/CONF, U
Amphetamines: NEGATIVE ng/mL
Barbiturates: NEGATIVE ng/mL
Benzodiazepines: NEGATIVE ng/mL
Cocaine Metabolite: NEGATIVE ng/mL
Creatinine: 72.9 mg/dL
Marijuana Metabolite: NEGATIVE ng/mL
Methadone Metabolite: NEGATIVE ng/mL
Opiates: NEGATIVE ng/mL
Oxidant: NEGATIVE ug/mL
Oxycodone: NEGATIVE ng/mL
pH: 5.7 (ref 4.5–9.0)

## 2019-02-05 LAB — PAIN MGMT, TRAMADOL W/MEDMATCH, U
Desmethyltramadol: 4409 ng/mL
Tramadol: 4670 ng/mL

## 2019-02-07 NOTE — Progress Notes (Signed)
UDS is consistent with treatment.

## 2019-03-14 ENCOUNTER — Other Ambulatory Visit: Payer: Self-pay | Admitting: Rheumatology

## 2019-03-14 DIAGNOSIS — G894 Chronic pain syndrome: Secondary | ICD-10-CM

## 2019-03-14 NOTE — Telephone Encounter (Signed)
Last Visit: 02/03/19 Next Visit: 08/04/19 UDS:02/03/19 Narc Agreement: 02/03/19  Okay to refill Tramadol?

## 2019-05-16 ENCOUNTER — Ambulatory Visit: Payer: 59 | Attending: Podiatry | Admitting: Physical Therapy

## 2019-05-16 ENCOUNTER — Other Ambulatory Visit: Payer: Self-pay

## 2019-05-16 ENCOUNTER — Encounter: Payer: Self-pay | Admitting: Physical Therapy

## 2019-05-16 DIAGNOSIS — M25571 Pain in right ankle and joints of right foot: Secondary | ICD-10-CM | POA: Diagnosis present

## 2019-05-16 DIAGNOSIS — R600 Localized edema: Secondary | ICD-10-CM | POA: Diagnosis present

## 2019-05-16 DIAGNOSIS — R262 Difficulty in walking, not elsewhere classified: Secondary | ICD-10-CM | POA: Diagnosis present

## 2019-05-16 DIAGNOSIS — M6281 Muscle weakness (generalized): Secondary | ICD-10-CM

## 2019-05-16 NOTE — Therapy (Signed)
[place], [place], [address] Phone: [phone]   Fax:  [phone]  Physical Therapy Evaluation  Patient Details  Name: Miranda Ross MRN: [id number] Date of Birth: 05-12-1976 Referring Provider (PT): Dr. Inocencio Homes    Encounter Date: 05/16/2019  PT End of Session - 05/16/19 0916    Visit Number  1    Number of Visits  12    Date for PT Re-Evaluation  06/27/19    PT Start Time  0745    PT Stop Time  0830    PT Time Calculation (min)  45 min    Activity Tolerance  Patient tolerated treatment well    Behavior During Therapy  [facility] for tasks assessed/performed       Past Medical History:  Diagnosis Date  . Arthritis   . Asthma    uses albuterol inh prn  . Diabetes mellitus without complication (HCC)    NIDDM  . PONV (postoperative nausea and vomiting)   . Sleep apnea    sleep study 2013, didnt finish study, left early    Past Surgical History:  Procedure Laterality Date  . BACK SURGERY     cervical neck fusion 2002  . FOOT SURGERY    . MASS EXCISION Left 09/02/2012   Procedure: MINOR EXCISION OF CYST LEFT RING FINGER A-3 PULLEY;  Surgeon: Cammie Sickle., MD;  Location: [place];  Service: Orthopedics;  Laterality: Left;  . neck fusion    . OOPHORECTOMY    . right hand surgery    . WRIST SURGERY     TFCC repair 2003    There were no vitals filed for this visit.   Subjective Assessment - 05/16/19 0749    Subjective  Pt has Rt sided leg and foot pain due to plantar fascitis and achilles tendinopathy.  She has an injection 3 weeks ago (cortisone) which helps a little.  She has a long standing issue with LLE as well. She has difficulty walking and doing stairs especially navigating inclines.  Does not walk barefoot.  Crepitus.  Feels off balance and is very aware of her gait, balance. Stands and has to steady herself before just jumping up and walking.    Limitations   Walking;Standing;House hold activities    Diagnostic tests  has had XR for both feet, multiple bone spurs but not available    Patient Stated Goals  Pt would like to avoid another surgery.  Pain relief, wants one good foot.    Currently in Pain?  Yes    Pain Score  5     Pain Location  Foot    Pain Orientation  Right;Medial;Proximal;Posterior    Pain Descriptors / Indicators  Burning;Other (Comment);Stabbing;Sharp    Pain Type  Chronic pain    Pain Onset  More than a month ago    Pain Frequency  Constant    Aggravating Factors   when transitioning sit to stand, inclines    Pain Relieving Factors  inserts did not help her pain.  she has a pair of shoes that feel good.  Elevation, massaging. constantly moving    Effect of Pain on Daily Activities  limits comfort    Multiple Pain Sites  No         [facility] PT Assessment - 05/16/19 0001      Assessment   Medical Diagnosis  plantar fasciitis, achilles tendinopathy     Referring Provider (PT)  Dr. Jenny Reichmann  [place]     Onset Date/Surgical Date  --   3 mos    Prior Therapy  Yes for LLE pre surgical       Precautions   Precautions  None      Restrictions   Weight Bearing Restrictions  No      Balance Screen   Has the patient fallen in the past 6 months  No    Has the patient had a decrease in activity level because of a fear of falling?   No    Is the patient reluctant to leave their home because of a fear of falling?   No      Home Film/video editor residence    Living Arrangements  Spouse/significant other;Children    Type of [place] to enter    Entrance Stairs-Number of Steps  2    Entrance Stairs-Rails  None    Home Layout  One level      Prior Function   Level of Independence  Independent    Vocation  Full time employment    Vocation Requirements  works in pharmacy     Leisure  2 dogs      Cognition   Overall Cognitive Status  Within Functional Limits for tasks assessed       Observation/Other Assessments   Focus on Therapeutic Outcomes (FOTO)   67%      Observation/Other Assessments-Edema    Edema  Figure 8      Figure 8 Edema   Figure 8 - Right   20 3/4 inch     Figure 8 - Left   21 inch       Sensation   Light Touch  Impaired by gross assessment    Additional Comments  from injection still a bit numb       Functional Tests   Functional tests  Squat;Single leg stance      Single Leg Stance   Comments  impaired bilateral about 15 sec       Posture/Postural Control   Posture/Postural Control  Postural limitations    Posture Comments  pes planus , genu recurvatum       AROM   Right Ankle Dorsiflexion  9    Right Ankle Plantar Flexion  38    Right Ankle Inversion  22    Right Ankle Eversion  11    Left Ankle Dorsiflexion  9    Left Ankle Plantar Flexion  28    Left Ankle Inversion  28    Left Ankle Eversion  11      PROM   Overall PROM Comments  painful Rt ankle all planes       Strength   Right Ankle Plantar Flexion  3+/5    Left Ankle Plantar Flexion  3+/5      Palpation   Palpation comment  pain along medial Rt ankle, post to medial malleolus and Achilles, distal calf       Ambulation/Gait   Ambulation Distance (Feet)  150 Feet    Assistive device  None    Gait Pattern  Step-through pattern;Decreased stance time - right;Antalgic        Objective measurements completed on examination: See above findings.      [facility] Adult PT Treatment/Exercise - 05/16/19 0001      Ankle Exercises: Stretches   Plantar Fascia Stretch  2 reps;30 seconds  Ankle Exercises: Standing   Heel Raises  Both;10 reps             PT Education - 05/16/19 0952    Education Details  PT/POC, HEP and potential improvements    Person(s) Educated  Patient    Methods  Explanation;Demonstration;Verbal cues;Handout    Comprehension  Verbalized understanding;Returned demonstration          PT Long Term Goals - 05/16/19 0953      PT LONG  TERM GOAL #1   Title  She will be independent with all HEP issued as of last visit    Time  6    Period  Weeks    Status  New    Target Date  07/01/19      PT LONG TERM GOAL #2   Title  She will report pain becoming less after work hours, no more than 4/10 most days of week.    Time  6    Period  Weeks    Status  New    Target Date  07/01/19      PT LONG TERM GOAL #3   Title  Pt will be able to walk up and down inclines with min pain    Baseline  pain moderate    Time  6    Period  Weeks    Status  New    Target Date  07/01/19      PT LONG TERM GOAL #4   Title  Pt will be able to stand for 30 sec on each leg SLS to improve stability and intrinisic foot stability.    Time  6    Period  Weeks    Status  New    Target Date  07/01/19      PT LONG TERM GOAL #5   Title  Pt will be able to perform 10 bilateral heel raises without pain and improved clearance    Time  6    Period  Weeks    Status  New    Target Date  07/01/19             Plan - 05/16/19 0957    Clinical Impression Statement  Pt presents for mod complexity eval of Rt sided foot and ankle due to plantar fasciitis and Achilles tendinopathy.  She subjectively reports bone spurs throughout her ankle and foot. She continues to have L ankle pain even post Topaz lengthening procedure.  She demonstrates ankle weakness, normal AROM but poor stability and push off during gait. Her progress will be limited as her L ankle had a similar presentation.  She is limited in mobility and in particular walking up and downhill. She is resistant to getting orthotics as it did not help and cause more pain.    Personal Factors and Comorbidities  Comorbidity 1;Comorbidity 2;Past/Current Experience    Examination-Activity Limitations  Stairs;Bed Mobility;Transfers;Locomotion Level    Examination-Participation Restrictions  Community Activity;Shop    Stability/Clinical Decision Making  Evolving/Moderate complexity    Clinical Decision  Making  Moderate    Rehab Potential  Good    PT Frequency  2x / week    PT Duration  6 weeks    PT Treatment/Interventions  ADLs/Self Care Home Management;Iontophoresis 4mg /ml Dexamethasone;Electrical Stimulation;Moist Heat;Ultrasound;Gait training;Therapeutic activities;Neuromuscular re-education;Manual techniques;Patient/family education;Functional mobility training;Therapeutic exercise;Taping;Passive range of motion;Dry needling    PT Next Visit Plan  check HEP, manual to Rt lower leg, ankle    PT Home Exercise Plan  bilateral heel  raises sitting vs standing, wall stretchig for calf    Consulted and Agree with Plan of Care  Patient       Patient will benefit from skilled therapeutic intervention in order to improve the following deficits and impairments:  Decreased balance, Decreased range of motion, Decreased strength, Increased fascial restricitons, Postural dysfunction, Difficulty walking, Increased edema, Decreased mobility, Pain, Obesity, Impaired flexibility  Visit Diagnosis: Pain in right ankle and joints of right foot  Localized edema  Muscle weakness (generalized)  Difficulty in walking, not elsewhere classified     Problem List Patient Active Problem List   Diagnosis Date Noted  . Class 3 obesity with body mass index (BMI) of 50.0 to 59.9 in adult 01/29/2017  . Polyclonal gammopathy determined by serum protein electrophoresis 08/21/2016  . MGUS (monoclonal gammopathy of unknown significance) 08/18/2016  . History of asthma 07/28/2016  . Raynaud's disease without gangrene 07/24/2016  . Primary osteoarthritis of both knees 07/24/2016  . DJD (degenerative joint disease), cervical 07/24/2016  . History of gastroesophageal reflux (GERD) 07/24/2016  . ANA positive 07/24/2016  . Trochanteric bursitis of both hips 07/24/2016  . Hyperlipidemia 06/18/2013  . Hematuria 06/18/2013    Darnell Jeschke 05/16/2019, 1:32 PM  [facility] [address] [place], [place], [address] Phone: [phone]   Fax:  [phone]  Name: Miranda Ross MRN: [id number] Date of Birth: 1976/04/21   Raeford Razor, PT 05/16/19 1:32 PM Phone: [phone] Fax: [phone]

## 2019-05-16 NOTE — Patient Instructions (Signed)
Step 1  Step 2  Standing Gastroc Stretch reps: 3  sets: 1  hold: 30  daily: 2  weekly: 7 Setup  Begin in a standing position with your feet in a staggered stance, holding onto a stable surface for support. Movement  Keeping your back knee straight, push your hips forward. You will feel a stretch in the back of your lower leg. Tip  Make sure to keep both feet pointed straight forward and flat on the ground during the stretch. Step 1  Step 2  Standing Soleus Stretch reps: 3  sets: 1  hold: 30  daily: 2  weekly: 7 Setup  Begin with your feet in a staggered stance, holding onto a stable surface for support. Movement  Allow your back knee to bend slightly but keep your heel on the floor until you feel a stretch in the back of your lower leg down near your ankle. Tip  Make sure to keep both feet pointed straight forward and flat on the ground during the stretch. Step 1  Step 2  Standing Heel Raise reps: 10  sets: 2  hold: 5  daily: 2  weekly: 7 Setup  Begin in a standing upright position with your feet shoulder width apart.  Movement  Slowly raise both heels off the ground at the same time, then lower them down to the floor.  Tip  Make sure to keep your upper body still and avoid gripping with your toes.

## 2019-05-20 ENCOUNTER — Ambulatory Visit: Payer: 59 | Admitting: Physical Therapy

## 2019-05-25 ENCOUNTER — Encounter: Payer: Self-pay | Admitting: Physical Therapy

## 2019-05-25 ENCOUNTER — Ambulatory Visit: Payer: 59 | Admitting: Physical Therapy

## 2019-05-25 ENCOUNTER — Other Ambulatory Visit: Payer: Self-pay

## 2019-05-25 DIAGNOSIS — R600 Localized edema: Secondary | ICD-10-CM

## 2019-05-25 DIAGNOSIS — M6281 Muscle weakness (generalized): Secondary | ICD-10-CM

## 2019-05-25 DIAGNOSIS — R262 Difficulty in walking, not elsewhere classified: Secondary | ICD-10-CM

## 2019-05-25 DIAGNOSIS — M25571 Pain in right ankle and joints of right foot: Secondary | ICD-10-CM | POA: Diagnosis not present

## 2019-05-25 NOTE — Therapy (Signed)
[place], [place], [address] Phone: [phone]   Fax:  [phone]  Physical Therapy Treatment  Patient Details  Name: Miranda Ross MRN: [id number] Date of Birth: 1975/12/24 Referring Provider (PT): Dr. Inocencio Homes    Encounter Date: 05/25/2019  PT End of Session - 05/25/19 0811    Visit Number  2    Number of Visits  12    Date for PT Re-Evaluation  06/27/19    PT Start Time  0805    PT Stop Time  0845    PT Time Calculation (min)  40 min       Past Medical History:  Diagnosis Date  . Arthritis   . Asthma    uses albuterol inh prn  . Diabetes mellitus without complication (HCC)    NIDDM  . PONV (postoperative nausea and vomiting)   . Sleep apnea    sleep study 2013, didnt finish study, left early    Past Surgical History:  Procedure Laterality Date  . BACK SURGERY     cervical neck fusion 2002  . FOOT SURGERY    . MASS EXCISION Left 09/02/2012   Procedure: MINOR EXCISION OF CYST LEFT RING FINGER A-3 PULLEY;  Surgeon: Cammie Sickle., MD;  Location: [place];  Service: Orthopedics;  Laterality: Left;  . neck fusion    . OOPHORECTOMY    . right hand surgery    . WRIST SURGERY     TFCC repair 2003    There were no vitals filed for this visit.  Subjective Assessment - 05/25/19 0859    Subjective  I have been in a lot of pain the last several days. I did not complete the exercises due to pain.    Currently in Pain?  Yes    Pain Score  --   mod to severe depending on activity   Pain Location  Foot    Pain Orientation  Right;Left                       [facility] Adult PT Treatment/Exercise - 05/25/19 0001      Manual Therapy   Manual Therapy  Soft tissue mobilization    Soft tissue mobilization  Right gastroc STW and PROM into DF, multiple tigger points       Ankle Exercises: Seated   Ankle Circles/Pumps  10 reps    Towel Crunch  5 reps    Towel  Inversion/Eversion  5 reps    Heel Raises  15 reps    Toe Raise  15 reps    BAPS  Level 2    Other Seated Ankle Exercises  PF with green band , seated EOM      Ankle Exercises: Stretches   Plantar Fascia Stretch  2 reps;30 seconds    Gastroc Stretch  3 reps;30 seconds   seated, towel             PT Education - 05/25/19 0904    Education Details  HEP    Person(s) Educated  Patient    Methods  Explanation    Comprehension  Verbalized understanding;Returned demonstration          PT Long Term Goals - 05/16/19 0953      PT LONG TERM GOAL #1   Title  She will be independent with all HEP issued as of last visit    Time  6    Period  Weeks    Status  New    Target Date  07/01/19      PT LONG TERM GOAL #2   Title  She will report pain becoming less after work hours, no more than 4/10 most days of week.    Time  6    Period  Weeks    Status  New    Target Date  07/01/19      PT LONG TERM GOAL #3   Title  Pt will be able to walk up and down inclines with min pain    Baseline  pain moderate    Time  6    Period  Weeks    Status  New    Target Date  07/01/19      PT LONG TERM GOAL #4   Title  Pt will be able to stand for 30 sec on each leg SLS to improve stability and intrinisic foot stability.    Time  6    Period  Weeks    Status  New    Target Date  07/01/19      PT LONG TERM GOAL #5   Title  Pt will be able to perform 10 bilateral heel raises without pain and improved clearance    Time  6    Period  Weeks    Status  New    Target Date  07/01/19            Plan - 05/25/19 0904    Clinical Impression Statement  Pt reports increased pain and difficulty getting around over that last few days. She reports incrased pain and limited tolerance to HEP. Offered seated options for HEP and gave green band for seated PF and reviewed towel stretch for gastroc stretch. She tolerated all seated therex without c/o pain. Multiple triggerpoints in medial and lateral  calf noted and softened with soft tissue work. Issued tennis ball for self TPR to right calf.    PT Next Visit Plan  check HEP, manual to Rt lower leg, ankle    PT Home Exercise Plan  bilateral heel raises sitting vs standing, wall stretchig for calf, green band PF       Patient will benefit from skilled therapeutic intervention in order to improve the following deficits and impairments:  Decreased balance, Decreased range of motion, Decreased strength, Increased fascial restricitons, Postural dysfunction, Difficulty walking, Increased edema, Decreased mobility, Pain, Obesity, Impaired flexibility  Visit Diagnosis: Pain in right ankle and joints of right foot  Localized edema  Muscle weakness (generalized)  Difficulty in walking, not elsewhere classified     Problem List Patient Active Problem List   Diagnosis Date Noted  . Class 3 obesity with body mass index (BMI) of 50.0 to 59.9 in adult 01/29/2017  . Polyclonal gammopathy determined by serum protein electrophoresis 08/21/2016  . MGUS (monoclonal gammopathy of unknown significance) 08/18/2016  . History of asthma 07/28/2016  . Raynaud's disease without gangrene 07/24/2016  . Primary osteoarthritis of both knees 07/24/2016  . DJD (degenerative joint disease), cervical 07/24/2016  . History of gastroesophageal reflux (GERD) 07/24/2016  . ANA positive 07/24/2016  . Trochanteric bursitis of both hips 07/24/2016  . Hyperlipidemia 06/18/2013  . Hematuria 06/18/2013    Dorene Ar, PTA 05/25/2019, 9:05 AM  [place], [place], [address] Phone: [phone]   Fax:  [phone]  Name: Miranda Ross MRN: [id number] Date of Birth: 09/03/1975

## 2019-05-25 NOTE — Patient Instructions (Signed)
Use green band around ball of right foot. Toe toes down and slowly return x 20 , 2 x per day.

## 2019-05-27 ENCOUNTER — Ambulatory Visit: Payer: 59 | Admitting: Physical Therapy

## 2019-06-01 ENCOUNTER — Other Ambulatory Visit: Payer: Self-pay

## 2019-06-01 ENCOUNTER — Ambulatory Visit: Payer: 59 | Admitting: Physical Therapy

## 2019-06-01 ENCOUNTER — Encounter: Payer: Self-pay | Admitting: Physical Therapy

## 2019-06-01 DIAGNOSIS — M6281 Muscle weakness (generalized): Secondary | ICD-10-CM

## 2019-06-01 DIAGNOSIS — M25571 Pain in right ankle and joints of right foot: Secondary | ICD-10-CM

## 2019-06-01 DIAGNOSIS — R600 Localized edema: Secondary | ICD-10-CM

## 2019-06-01 DIAGNOSIS — R262 Difficulty in walking, not elsewhere classified: Secondary | ICD-10-CM

## 2019-06-01 NOTE — Therapy (Signed)
[place], [place], [address] Phone: [phone]   Fax:  [phone]  Physical Therapy Treatment  Patient Details  Name: Miranda Ross MRN: [id number] Date of Birth: 06/26/1975 Referring Provider (PT): Dr. Inocencio Homes    Encounter Date: 06/01/2019  PT End of Session - 06/01/19 1152    Visit Number  3    Number of Visits  12    Date for PT Re-Evaluation  06/27/19    PT Start Time  1147    PT Stop Time  1227    PT Time Calculation (min)  40 min       Past Medical History:  Diagnosis Date  . Arthritis   . Asthma    uses albuterol inh prn  . Diabetes mellitus without complication (HCC)    NIDDM  . PONV (postoperative nausea and vomiting)   . Sleep apnea    sleep study 2013, didnt finish study, left early    Past Surgical History:  Procedure Laterality Date  . BACK SURGERY     cervical neck fusion 2002  . FOOT SURGERY    . MASS EXCISION Left 09/02/2012   Procedure: MINOR EXCISION OF CYST LEFT RING FINGER A-3 PULLEY;  Surgeon: Cammie Sickle., MD;  Location: [facility];  Service: Orthopedics;  Laterality: Left;  . neck fusion    . OOPHORECTOMY    . right hand surgery    . WRIST SURGERY     TFCC repair 2003    There were no vitals filed for this visit.  Subjective Assessment - 06/01/19 1150    Subjective  3-4/10 pain right ankle, 6/10 left ankle.    Currently in Pain?  Yes    Pain Location  Foot    Pain Orientation  Right;Left   3 to 6/10   Pain Descriptors / Indicators  Burning    Pain Type  Chronic pain                       [facility] Adult PT Treatment/Exercise - 06/01/19 0001      Manual Therapy   Soft tissue mobilization  Bilateral Gastroc and lateral ankle , cross friction to achilles,  DF PROM       Ankle Exercises: Seated   Towel Crunch  5 reps    Towel Inversion/Eversion  5 reps    Heel Raises  20 reps    Toe Raise  20 reps    BAPS  Level 2     Other Seated Ankle Exercises  PF with green band , seated EOM, added the other 3 ways and instructed how to perform independently       Ankle Exercises: Stretches   Plantar Fascia Stretch  2 reps;30 seconds    Gastroc Stretch  3 reps;30 seconds   seated, towel             PT Education - 06/01/19 1206    Education Details  HEP    Person(s) Educated  Patient    Methods  Explanation;Handout    Comprehension  Verbalized understanding          PT Long Term Goals - 05/16/19 0953      PT LONG TERM GOAL #1   Title  She will be independent with all HEP issued as of last visit    Time  6    Period  Weeks    Status  New  Target Date  07/01/19      PT LONG TERM GOAL #2   Title  She will report pain becoming less after work hours, no more than 4/10 most days of week.    Time  6    Period  Weeks    Status  New    Target Date  07/01/19      PT LONG TERM GOAL #3   Title  Pt will be able to walk up and down inclines with min pain    Baseline  pain moderate    Time  6    Period  Weeks    Status  New    Target Date  07/01/19      PT LONG TERM GOAL #4   Title  Pt will be able to stand for 30 sec on each leg SLS to improve stability and intrinisic foot stability.    Time  6    Period  Weeks    Status  New    Target Date  07/01/19      PT LONG TERM GOAL #5   Title  Pt will be able to perform 10 bilateral heel raises without pain and improved clearance    Time  6    Period  Weeks    Status  New    Target Date  07/01/19            Plan - 06/01/19 1154    Clinical Impression Statement  Pt reports relief of pain for 2 days after manual last session. Then stepped down with right which caused a sharp increase in lateral right ankle pain. Progressed with 4 way ankle strength with green band and updated HEP. Repeated manual to bilateral LEs. Pt reports MD has entered referral to treat both feet however new referral not visible in chart.    PT Next Visit Plan  check  HEP, manual to Rt lower leg, ankle, referral for Left? begin closed chain if pain controlled    PT Home Exercise Plan  bilateral heel raises sitting vs standing, wall stretchig for calf, green band PF       Patient will benefit from skilled therapeutic intervention in order to improve the following deficits and impairments:  Decreased balance, Decreased range of motion, Decreased strength, Increased fascial restricitons, Postural dysfunction, Difficulty walking, Increased edema, Decreased mobility, Pain, Obesity, Impaired flexibility  Visit Diagnosis: Pain in right ankle and joints of right foot  Localized edema  Muscle weakness (generalized)  Difficulty in walking, not elsewhere classified     Problem List Patient Active Problem List   Diagnosis Date Noted  . Class 3 obesity with body mass index (BMI) of 50.0 to 59.9 in adult 01/29/2017  . Polyclonal gammopathy determined by serum protein electrophoresis 08/21/2016  . MGUS (monoclonal gammopathy of unknown significance) 08/18/2016  . History of asthma 07/28/2016  . Raynaud's disease without gangrene 07/24/2016  . Primary osteoarthritis of both knees 07/24/2016  . DJD (degenerative joint disease), cervical 07/24/2016  . History of gastroesophageal reflux (GERD) 07/24/2016  . ANA positive 07/24/2016  . Trochanteric bursitis of both hips 07/24/2016  . Hyperlipidemia 06/18/2013  . Hematuria 06/18/2013    Dorene Ar, PTA 06/01/2019, 12:31 PM  [facility] [address] [place], [place], [address] Phone: [phone]   Fax:  [phone]  Name: Miranda Ross MRN: [id number] Date of Birth: 1975/11/06

## 2019-06-02 ENCOUNTER — Encounter: Payer: 59 | Admitting: Physical Therapy

## 2019-06-07 ENCOUNTER — Ambulatory Visit: Payer: 59 | Admitting: Physical Therapy

## 2019-06-07 ENCOUNTER — Other Ambulatory Visit: Payer: Self-pay

## 2019-06-07 DIAGNOSIS — R262 Difficulty in walking, not elsewhere classified: Secondary | ICD-10-CM

## 2019-06-07 DIAGNOSIS — M6281 Muscle weakness (generalized): Secondary | ICD-10-CM

## 2019-06-07 DIAGNOSIS — M25571 Pain in right ankle and joints of right foot: Secondary | ICD-10-CM

## 2019-06-07 DIAGNOSIS — R600 Localized edema: Secondary | ICD-10-CM

## 2019-06-07 NOTE — Therapy (Signed)
[place], [place], [address] Phone: [phone]   Fax:  [phone]  Physical Therapy Treatment  Patient Details  Name: Miranda Ross MRN: [id number] Date of Birth: 1975-07-14 Referring Provider (PT): Dr. Inocencio Homes    Encounter Date: 06/07/2019  PT End of Session - 06/07/19 1628    Visit Number  4    Number of Visits  12    Date for PT Re-Evaluation  06/27/19    PT Start Time  1619    PT Stop Time  1706    PT Time Calculation (min)  47 min    Activity Tolerance  Patient tolerated treatment well    Behavior During Therapy  [facility] for tasks assessed/performed       Past Medical History:  Diagnosis Date  . Arthritis   . Asthma    uses albuterol inh prn  . Diabetes mellitus without complication (HCC)    NIDDM  . PONV (postoperative nausea and vomiting)   . Sleep apnea    sleep study 2013, didnt finish study, left early    Past Surgical History:  Procedure Laterality Date  . BACK SURGERY     cervical neck fusion 2002  . FOOT SURGERY    . MASS EXCISION Left 09/02/2012   Procedure: MINOR EXCISION OF CYST LEFT RING FINGER A-3 PULLEY;  Surgeon: Cammie Sickle., MD;  Location: [facility];  Service: Orthopedics;  Laterality: Left;  . neck fusion    . OOPHORECTOMY    . right hand surgery    . WRIST SURGERY     TFCC repair 2003    There were no vitals filed for this visit.  Subjective Assessment - 06/07/19 1621    Subjective  Both ankles are 6/10. That wall stretch is too much.    Currently in Pain?  Yes    Pain Score  6     Pain Location  Ankle    Pain Orientation  Right;Left    Pain Descriptors / Indicators  Aching    Pain Type  Chronic pain    Pain Onset  More than a month ago    Pain Frequency  Constant    Aggravating Factors   work, standing, sit to stand         [facility] Adult PT Treatment/Exercise - 06/07/19 0001      Manual Therapy   Manual Therapy  Taping    Soft tissue mobilization  Bilateral Gastroc and lateral ankle , cross friction to achilles,  DF PROM     McConnell  Achilles LLE (long I strip 50% and then small transverse strip with 75% across Tendon)       Ankle Exercises: Seated   Ankle Circles/Pumps  AROM;Strengthening;Both   circles with green band    Heel Raises  20 reps      Ankle Exercises: Stretches   Plantar Fascia Stretch  2 reps;30 seconds    Soleus Stretch  3 reps;10 seconds   less pain on Rt.  done in standing staggered at chair    Gastroc Stretch  3 reps;30 seconds   strap    Other Stretch  done with step as well, gastroc and soleus 30 sec x 2 each        Ankle Exercises: Standing   SLS  30 sec static at counter top          PT Long Term Goals - 05/16/19 [id number]  PT LONG TERM GOAL #1   Title  She will be independent with all HEP issued as of last visit    Time  6    Period  Weeks    Status  New    Target Date  07/01/19      PT LONG TERM GOAL #2   Title  She will report pain becoming less after work hours, no more than 4/10 most days of week.    Time  6    Period  Weeks    Status  New    Target Date  07/01/19      PT LONG TERM GOAL #3   Title  Pt will be able to walk up and down inclines with min pain    Baseline  pain moderate    Time  6    Period  Weeks    Status  New    Target Date  07/01/19      PT LONG TERM GOAL #4   Title  Pt will be able to stand for 30 sec on each leg SLS to improve stability and intrinisic foot stability.    Time  6    Period  Weeks    Status  New    Target Date  07/01/19      PT LONG TERM GOAL #5   Title  Pt will be able to perform 10 bilateral heel raises without pain and improved clearance    Time  6    Period  Weeks    Status  New    Target Date  07/01/19            Plan - 06/07/19 1630    Clinical Impression Statement  Pt reports her LLE feels less tight after session.  Pain inrease with standing but encouraged her to cont with gentle shorter holds and  less intensity as it is more effective than open chain.  Gastroc L is full of trigger points and knots.  Manual prior to standing is benefitcial.    Personal Factors and Comorbidities  Comorbidity 1;Comorbidity 2;Past/Current Experience    PT Treatment/Interventions  ADLs/Self Care Home Management;Iontophoresis 4mg /ml Dexamethasone;Electrical Stimulation;Moist Heat;Ultrasound;Gait training;Therapeutic activities;Neuromuscular re-education;Manual techniques;Patient/family education;Functional mobility training;Therapeutic exercise;Taping;Passive range of motion;Dry needling    PT Next Visit Plan  check HEP, manual to bilateral lower leg, ankle, referral for Left? begin closed chain if pain controlled    PT Home Exercise Plan  bilateral heel raises sitting vs standing, wall stretchig for calf, green band PF       Patient will benefit from skilled therapeutic intervention in order to improve the following deficits and impairments:  Decreased balance, Decreased range of motion, Decreased strength, Increased fascial restricitons, Postural dysfunction, Difficulty walking, Increased edema, Decreased mobility, Pain, Obesity, Impaired flexibility  Visit Diagnosis: Pain in right ankle and joints of right foot  Localized edema  Muscle weakness (generalized)  Difficulty in walking, not elsewhere classified     Problem List Patient Active Problem List   Diagnosis Date Noted  . Class 3 obesity with body mass index (BMI) of 50.0 to 59.9 in adult 01/29/2017  . Polyclonal gammopathy determined by serum protein electrophoresis 08/21/2016  . MGUS (monoclonal gammopathy of unknown significance) 08/18/2016  . History of asthma 07/28/2016  . Raynaud's disease without gangrene 07/24/2016  . Primary osteoarthritis of both knees 07/24/2016  . DJD (degenerative joint disease), cervical 07/24/2016  . History of gastroesophageal reflux (GERD) 07/24/2016  . ANA positive 07/24/2016  . Trochanteric  bursitis of  both hips 07/24/2016  . Hyperlipidemia 06/18/2013  . Hematuria 06/18/2013    Miranda Ross 06/07/2019, 5:22 PM  [facility] [address] [place], [place], [address] Phone: [phone]   Fax:  [phone]  Name: Miranda Ross MRN: [id number] Date of Birth: 01-01-1976  Raeford Razor, PT 06/07/19 5:22 PM Phone: [phone] Fax: [phone]

## 2019-06-09 ENCOUNTER — Ambulatory Visit: Payer: 59 | Admitting: Physical Therapy

## 2019-06-09 ENCOUNTER — Encounter: Payer: Self-pay | Admitting: Physical Therapy

## 2019-06-09 ENCOUNTER — Other Ambulatory Visit: Payer: Self-pay

## 2019-06-09 DIAGNOSIS — M6281 Muscle weakness (generalized): Secondary | ICD-10-CM

## 2019-06-09 DIAGNOSIS — R262 Difficulty in walking, not elsewhere classified: Secondary | ICD-10-CM

## 2019-06-09 DIAGNOSIS — M25571 Pain in right ankle and joints of right foot: Secondary | ICD-10-CM

## 2019-06-09 DIAGNOSIS — R600 Localized edema: Secondary | ICD-10-CM

## 2019-06-10 ENCOUNTER — Encounter: Payer: Self-pay | Admitting: Physical Therapy

## 2019-06-10 NOTE — Therapy (Signed)
[place], [place], [address] Phone: [phone]   Fax:  [phone]  Physical Therapy Treatment  Patient Details  Name: Miranda Ross MRN: [id number] Date of Birth: 1975/10/27 Referring Provider (PT): Dr. Inocencio Homes    Encounter Date: 06/09/2019  PT End of Session - 06/10/19 0721    Visit Number  5    Number of Visits  12    Date for PT Re-Evaluation  06/27/19    PT Start Time  1616    PT Stop Time  1700    PT Time Calculation (min)  44 min    Activity Tolerance  Patient tolerated treatment well    Behavior During Therapy  [facility] for tasks assessed/performed       Past Medical History:  Diagnosis Date  . Arthritis   . Asthma    uses albuterol inh prn  . Diabetes mellitus without complication (HCC)    NIDDM  . PONV (postoperative nausea and vomiting)   . Sleep apnea    sleep study 2013, didnt finish study, left early    Past Surgical History:  Procedure Laterality Date  . BACK SURGERY     cervical neck fusion 2002  . FOOT SURGERY    . MASS EXCISION Left 09/02/2012   Procedure: MINOR EXCISION OF CYST LEFT RING FINGER A-3 PULLEY;  Surgeon: Cammie Sickle., MD;  Location: [place];  Service: Orthopedics;  Laterality: Left;  . neck fusion    . OOPHORECTOMY    . right hand surgery    . WRIST SURGERY     TFCC repair 2003    There were no vitals filed for this visit.  Subjective Assessment - 06/09/19 1626    Subjective  The tape kinda helped, I was surprised! 6/10 on Rt , 5/10 on Lt    Currently in Pain?  --   see 2 days ago        [facility] Adult PT Treatment/Exercise - 06/10/19 0001      Knee/Hip Exercises: Stretches   Piriformis Stretch Limitations  knees crossed lateral trunk rotation x 5       Manual Therapy   Manual Therapy  Taping    Soft tissue mobilization  Bilateral Gastroc and lateral ankle , cross friction to achilles,  DF PROM     McConnell  Achilles LLE  (long I strip 50% and then small transverse strip with 75% across Tendon)       Ankle Exercises: Standing   SLS  on thera-pad each LE , static then added semi circles x 10     Heel Raises  Both;10 reps      Ankle Exercises: Stretches   Soleus Stretch  3 reps;30 seconds    Gastroc Stretch  3 reps;30 seconds    Other Stretch  done standing at countertop          PT Long Term Goals - 06/10/19 [id number]      PT LONG TERM GOAL #1   Title  She will be independent with all HEP issued as of last visit    Status  On-going      PT LONG TERM GOAL #2   Title  She will report pain becoming less after work hours, no more than 4/10 most days of week.    Status  On-going      PT LONG TERM GOAL #3   Title  Pt will be able to walk up and  down inclines with min pain    Status  On-going      PT LONG TERM GOAL #4   Title  Pt will be able to stand for 30 sec on each leg SLS to improve stability and intrinisic foot stability.    Status  On-going      PT LONG TERM GOAL #5   Title  Pt will be able to perform 10 bilateral heel raises without pain and improved clearance    Status  On-going            Plan - 06/10/19 [id number]    Clinical Impression Statement  Patient did find benefit from kinesiotape to LLE to reduce tension on Achilles.  She was able to progress to standing with min increase in pain.  Unable to say she is improving but woke Wed AM without feeling severe tightness and pulling in LLE.    PT Treatment/Interventions  ADLs/Self Care Home Management;Iontophoresis 4mg /ml Dexamethasone;Electrical Stimulation;Moist Heat;Ultrasound;Gait training;Therapeutic activities;Neuromuscular re-education;Manual techniques;Patient/family education;Functional mobility training;Therapeutic exercise;Taping;Passive range of motion;Dry needling    PT Next Visit Plan  manual to bilateral lower leg, ankle, begin closed chain if pain controlled    PT Home Exercise Plan  bilateral heel raises sitting vs standing,  wall stretchig for calf, green band PF       Patient will benefit from skilled therapeutic intervention in order to improve the following deficits and impairments:  Decreased balance, Decreased range of motion, Decreased strength, Increased fascial restricitons, Postural dysfunction, Difficulty walking, Increased edema, Decreased mobility, Pain, Obesity, Impaired flexibility  Visit Diagnosis: Pain in right ankle and joints of right foot  Localized edema  Muscle weakness (generalized)  Difficulty in walking, not elsewhere classified     Problem List Patient Active Problem List   Diagnosis Date Noted  . Class 3 obesity with body mass index (BMI) of 50.0 to 59.9 in adult 01/29/2017  . Polyclonal gammopathy determined by serum protein electrophoresis 08/21/2016  . MGUS (monoclonal gammopathy of unknown significance) 08/18/2016  . History of asthma 07/28/2016  . Raynaud's disease without gangrene 07/24/2016  . Primary osteoarthritis of both knees 07/24/2016  . DJD (degenerative joint disease), cervical 07/24/2016  . History of gastroesophageal reflux (GERD) 07/24/2016  . ANA positive 07/24/2016  . Trochanteric bursitis of both hips 07/24/2016  . Hyperlipidemia 06/18/2013  . Hematuria 06/18/2013    Montgomery Rothlisberger 06/10/2019, 7:29 AM  [facility] [address] [place], [place], [address] Phone: [phone]   Fax:  [phone]  Name: Miranda Ross MRN: [id number] Date of Birth: 08/10/1975  Raeford Razor, PT 06/10/19 7:29 AM Phone: [phone] Fax: [phone]

## 2019-06-14 ENCOUNTER — Ambulatory Visit: Payer: 59 | Attending: Podiatry | Admitting: Physical Therapy

## 2019-06-14 ENCOUNTER — Other Ambulatory Visit: Payer: Self-pay

## 2019-06-14 ENCOUNTER — Encounter: Payer: Self-pay | Admitting: Physical Therapy

## 2019-06-14 DIAGNOSIS — M25571 Pain in right ankle and joints of right foot: Secondary | ICD-10-CM | POA: Insufficient documentation

## 2019-06-14 DIAGNOSIS — R600 Localized edema: Secondary | ICD-10-CM | POA: Insufficient documentation

## 2019-06-14 DIAGNOSIS — M6281 Muscle weakness (generalized): Secondary | ICD-10-CM | POA: Diagnosis present

## 2019-06-14 DIAGNOSIS — R262 Difficulty in walking, not elsewhere classified: Secondary | ICD-10-CM | POA: Diagnosis present

## 2019-06-14 NOTE — Therapy (Signed)
[place], [place], [address] Phone: [phone]   Fax:  [phone]  Physical Therapy Treatment  Patient Details  Name: Miranda Ross MRN: [id number] Date of Birth: 1975-12-14 Referring Provider (PT): Dr. Inocencio Homes    Encounter Date: 06/14/2019  PT End of Session - 06/14/19 1625    Visit Number  6    Number of Visits  12    Date for PT Re-Evaluation  06/27/19    PT Start Time  1620    PT Stop Time  1700    PT Time Calculation (min)  40 min       Past Medical History:  Diagnosis Date  . Arthritis   . Asthma    uses albuterol inh prn  . Diabetes mellitus without complication (HCC)    NIDDM  . PONV (postoperative nausea and vomiting)   . Sleep apnea    sleep study 2013, didnt finish study, left early    Past Surgical History:  Procedure Laterality Date  . BACK SURGERY     cervical neck fusion 2002  . FOOT SURGERY    . MASS EXCISION Left 09/02/2012   Procedure: MINOR EXCISION OF CYST LEFT RING FINGER A-3 PULLEY;  Surgeon: Cammie Sickle., MD;  Location: [facility];  Service: Orthopedics;  Laterality: Left;  . neck fusion    . OOPHORECTOMY    . right hand surgery    . WRIST SURGERY     TFCC repair 2003    There were no vitals filed for this visit.  Subjective Assessment - 06/14/19 1623    Subjective  I stepped off a step today and it hurt really badly.  Its been hurting all day. The massage has been helping and my calves don't cramp as easy.    Currently in Pain?  Yes    Pain Score  5     Pain Location  Ankle    Pain Orientation  Right    Pain Descriptors / Indicators  Aching;Tightness    Pain Type  Chronic pain    Pain Onset  More than a month ago    Pain Frequency  Intermittent    Aggravating Factors   work, standing, sit to stand    Pain Relieving Factors  inserts, elevation, massaging          PT Education - 06/14/19 1751    Education Details  how to  tape ankle, leg    Person(s) Educated  Patient    Methods  Explanation;Verbal cues    Comprehension  Verbalized understanding      [facility] Adult PT Treatment/Exercise - 06/14/19 0001      Knee/Hip Exercises: Stretches   Active Hamstring Stretch  Both;3 reps;30 seconds      Manual Therapy   Manual Therapy  Taping    Soft tissue mobilization  Bilateral Gastroc and lateral ankle , cross friction to achilles,  DF PROM     McConnell  Achilles LLE (long I strip 50% and then small transverse strip with 75% across Tendon)       Ankle Exercises: Supine   T-Band  combined Green band x 15 INV/DF, EVERSION/PF, PF with PT assist          PT Long Term Goals - 06/10/19 [id number]      PT LONG TERM GOAL #1   Title  She will be independent with all HEP issued as of last visit    Status  On-going      PT LONG TERM GOAL #2   Title  She will report pain becoming less after work hours, no more than 4/10 most days of week.    Status  On-going      PT LONG TERM GOAL #3   Title  Pt will be able to walk up and down inclines with min pain    Status  On-going      PT LONG TERM GOAL #4   Title  Pt will be able to stand for 30 sec on each leg SLS to improve stability and intrinisic foot stability.    Status  On-going      PT LONG TERM GOAL #5   Title  Pt will be able to perform 10 bilateral heel raises without pain and improved clearance    Status  On-going            Plan - 06/14/19 1625    Clinical Impression Statement  Patient feeling minor improvement in ankle pain and function.  She has less pain in the AM when she stands. Due to chronic nature of her condition expect a longer course of PT.    Personal Factors and Comorbidities  Comorbidity 1;Comorbidity 2;Past/Current Experience    Examination-Activity Limitations  Stairs;Bed Mobility;Transfers;Locomotion Level    Examination-Participation Restrictions  Community Activity;Shop    Stability/Clinical Decision Making  Evolving/Moderate  complexity    PT Treatment/Interventions  ADLs/Self Care Home Management;Iontophoresis 4mg /ml Dexamethasone;Electrical Stimulation;Moist Heat;Ultrasound;Gait training;Therapeutic activities;Neuromuscular re-education;Manual techniques;Patient/family education;Functional mobility training;Therapeutic exercise;Taping;Passive range of motion;Dry needling    PT Next Visit Plan  manual to bilateral lower leg, ankle, begin closed chain if pain controlled    PT Home Exercise Plan  bilateral heel raises sitting vs standing, wall stretchig for calf, green band PF    Consulted and Agree with Plan of Care  Patient       Patient will benefit from skilled therapeutic intervention in order to improve the following deficits and impairments:  Decreased balance, Decreased range of motion, Decreased strength, Increased fascial restricitons, Postural dysfunction, Difficulty walking, Increased edema, Decreased mobility, Pain, Obesity, Impaired flexibility  Visit Diagnosis: Pain in right ankle and joints of right foot  Muscle weakness (generalized)  Localized edema  Difficulty in walking, not elsewhere classified     Problem List Patient Active Problem List   Diagnosis Date Noted  . Class 3 obesity with body mass index (BMI) of 50.0 to 59.9 in adult 01/29/2017  . Polyclonal gammopathy determined by serum protein electrophoresis 08/21/2016  . MGUS (monoclonal gammopathy of unknown significance) 08/18/2016  . History of asthma 07/28/2016  . Raynaud's disease without gangrene 07/24/2016  . Primary osteoarthritis of both knees 07/24/2016  . DJD (degenerative joint disease), cervical 07/24/2016  . History of gastroesophageal reflux (GERD) 07/24/2016  . ANA positive 07/24/2016  . Trochanteric bursitis of both hips 07/24/2016  . Hyperlipidemia 06/18/2013  . Hematuria 06/18/2013    Doshia Dalia 06/14/2019, 6:02 PM  [facility] [address] [place], [place], [address] Phone: [phone]   Fax:  [phone]  Name: Miranda Ross MRN: [id number] Date of Birth: 1975-06-09  Raeford Razor, PT 06/14/19 6:02 PM Phone: [phone] Fax: [phone]

## 2019-06-16 ENCOUNTER — Ambulatory Visit: Payer: 59 | Admitting: Physical Therapy

## 2019-06-27 ENCOUNTER — Ambulatory Visit: Payer: 59 | Admitting: Physical Therapy

## 2019-06-29 ENCOUNTER — Ambulatory Visit: Payer: 59 | Admitting: Physical Therapy

## 2019-07-04 ENCOUNTER — Encounter: Payer: Self-pay | Admitting: Physical Therapy

## 2019-07-04 ENCOUNTER — Other Ambulatory Visit: Payer: Self-pay

## 2019-07-04 ENCOUNTER — Ambulatory Visit: Payer: 59 | Admitting: Physical Therapy

## 2019-07-04 DIAGNOSIS — M6281 Muscle weakness (generalized): Secondary | ICD-10-CM

## 2019-07-04 DIAGNOSIS — R600 Localized edema: Secondary | ICD-10-CM

## 2019-07-04 DIAGNOSIS — R262 Difficulty in walking, not elsewhere classified: Secondary | ICD-10-CM

## 2019-07-04 DIAGNOSIS — M25571 Pain in right ankle and joints of right foot: Secondary | ICD-10-CM

## 2019-07-05 NOTE — Therapy (Signed)
[place], [place], [address] Phone: [phone]   Fax:  [phone]  Physical Therapy Treatment/Renewal  Patient Details  Name: Miranda Ross MRN: [id number] Date of Birth: February 23, 1976 Referring Provider (PT): Dr. Inocencio Homes    Encounter Date: 07/04/2019  PT End of Session - 07/04/19 0807    Visit Number  7    Number of Visits  15    Date for PT Re-Evaluation  08/15/19    PT Start Time  0800    PT Stop Time  0845    PT Time Calculation (min)  45 min       Past Medical History:  Diagnosis Date  . Arthritis   . Asthma    uses albuterol inh prn  . Diabetes mellitus without complication (HCC)    NIDDM  . PONV (postoperative nausea and vomiting)   . Sleep apnea    sleep study 2013, didnt finish study, left early    Past Surgical History:  Procedure Laterality Date  . BACK SURGERY     cervical neck fusion 2002  . FOOT SURGERY    . MASS EXCISION Left 09/02/2012   Procedure: MINOR EXCISION OF CYST LEFT RING FINGER A-3 PULLEY;  Surgeon: Cammie Sickle., MD;  Location: [place] [facility];  Service: Orthopedics;  Laterality: Left;  . neck fusion    . OOPHORECTOMY    . right hand surgery    . WRIST SURGERY     TFCC repair 2003    There were no vitals filed for this visit.  Subjective Assessment - 07/04/19 0810    Subjective  Pt reports she has been absent from PT for 2 weeks due to work obligations. She is in pain today. 5/10 right ankle, 6-7/10 left ankle    Pain Score  7     Pain Location  Ankle    Pain Orientation  Left;Posterior;Medial    Pain Descriptors / Indicators  Stabbing;Aching    Pain Type  Chronic pain    Aggravating Factors   on feet alot    Pain Relieving Factors  inserts, elevation, exercises            [facility] Adult PT Treatment/Exercise - 07/05/19 0001      Knee/Hip Exercises: Stretches   Active Hamstring Stretch  Both;3 reps;30 seconds      Manual Therapy    Manual Therapy  Taping    Soft tissue mobilization  Bilateral Gastroc and lateral ankle , cross friction to achilles,  DF PROM     McConnell  Achilles LLE (long I strip 50% and then small transverse strip with 75% across Tendon)       Ankle Exercises: Standing   Heel Raises  Both;10 reps   burning noted     Ankle Exercises: Stretches   Soleus Stretch  3 reps;30 seconds    Gastroc Stretch  3 reps;30 seconds    Other Stretch  done standing at countertop                   PT Long Term Goals - 07/04/19 6962      PT LONG TERM GOAL #1   Title  She will be independent with all HEP issued as of last visit    Time  6    Period  Weeks    Status  On-going      PT LONG TERM GOAL #2   Title  She will report pain  becoming less after work hours, no more than 4/10 most days of week.    Baseline  reports less pain after PT sessions, unable to attend last 2 weeks    Time  6    Period  Weeks    Status  On-going      PT LONG TERM GOAL #3   Title  Pt will be able to walk up and down inclines with min pain    Baseline  pain moderate, unchanged    Time  6    Period  Weeks    Status  On-going      PT LONG TERM GOAL #4   Title  Pt will be able to stand for 30 sec on each leg SLS to improve stability and intrinisic foot stability.    Baseline  Feb 22nd : 4 sec Left, 9 sec right    Time  6    Period  Weeks    Status  On-going      PT LONG TERM GOAL #5   Title  Pt will be able to perform 10 bilateral heel raises without pain and improved clearance    Baseline  good ROM, burning, able to complete 10 reps    Time  6    Period  Weeks    Status  Partially Met            Plan - 07/04/19 1053    Clinical Impression Statement  Pt arrivs after 2 weks absence due to work obligations. She reports improvement with PT treatments when she was able to attend consistently. Her ankle ROM has improved bilaterally and she is able to tolerate closed chain stretching and heel raises with  improved lift. She is progressing toward LTGs . LTG#5 partially met. SLS is still 4 to 9 seconds bilaterally and she would  benefit from additional PT to improved ankle stability and decrease pain with functional activities.    PT Treatment/Interventions  ADLs/Self Care Home Management;Iontophoresis 28m/ml Dexamethasone;Electrical Stimulation;Moist Heat;Ultrasound;Gait training;Therapeutic activities;Neuromuscular re-education;Manual techniques;Patient/family education;Functional mobility training;Therapeutic exercise;Taping;Passive range of motion;Dry needling    PT Next Visit Plan  manual to bilateral lower leg, ankle, continue closed chain if pain controlled    PT Home Exercise Plan  bilateral heel raises sitting vs standing, wall stretchig for calf, green band PF       Patient will benefit from skilled therapeutic intervention in order to improve the following deficits and impairments:     Visit Diagnosis: Pain in right ankle and joints of right foot  Muscle weakness (generalized)  Localized edema  Difficulty in walking, not elsewhere classified     Problem List Patient Active Problem List   Diagnosis Date Noted  . Class 3 obesity with body mass index (BMI) of 50.0 to 59.9 in adult 01/29/2017  . Polyclonal gammopathy determined by serum protein electrophoresis 08/21/2016  . MGUS (monoclonal gammopathy of unknown significance) 08/18/2016  . History of asthma 07/28/2016  . Raynaud's disease without gangrene 07/24/2016  . Primary osteoarthritis of both knees 07/24/2016  . DJD (degenerative joint disease), cervical 07/24/2016  . History of gastroesophageal reflux (GERD) 07/24/2016  . ANA positive 07/24/2016  . Trochanteric bursitis of both hips 07/24/2016  . Hyperlipidemia 06/18/2013  . Hematuria 06/18/2013    Miranda Ross 07/05/2019, 8:12 AM  [place] [address]: [phone]  Fax:   [phone] Name: Miranda Ross  Miranda Ross, PT 07/05/19 8:12 AM Phone: [phone] Fax: [phone]

## 2019-07-06 ENCOUNTER — Other Ambulatory Visit: Payer: Self-pay

## 2019-07-06 ENCOUNTER — Ambulatory Visit: Payer: 59 | Admitting: Physical Therapy

## 2019-07-06 ENCOUNTER — Encounter: Payer: Self-pay | Admitting: Physical Therapy

## 2019-07-06 ENCOUNTER — Other Ambulatory Visit: Payer: Self-pay | Admitting: Rheumatology

## 2019-07-06 DIAGNOSIS — M25571 Pain in right ankle and joints of right foot: Secondary | ICD-10-CM

## 2019-07-06 DIAGNOSIS — M6281 Muscle weakness (generalized): Secondary | ICD-10-CM

## 2019-07-06 DIAGNOSIS — R262 Difficulty in walking, not elsewhere classified: Secondary | ICD-10-CM

## 2019-07-06 DIAGNOSIS — R600 Localized edema: Secondary | ICD-10-CM

## 2019-07-06 DIAGNOSIS — G894 Chronic pain syndrome: Secondary | ICD-10-CM

## 2019-07-06 NOTE — Therapy (Signed)
[place], [place], [address] Phone: [phone]   Fax:  [phone]  Physical Therapy Treatment  Patient Details  Name: Miranda Ross MRN: [id number] Date of Birth: April 12, 1976 Referring Provider (PT): Dr. Inocencio Homes    Encounter Date: 07/06/2019  PT End of Session - 07/06/19 0804    Visit Number  8    Number of Visits  15    Date for PT Re-Evaluation  08/15/19    PT Start Time  0800    PT Stop Time  0840    PT Time Calculation (min)  40 min       Past Medical History:  Diagnosis Date  . Arthritis   . Asthma    uses albuterol inh prn  . Diabetes mellitus without complication (HCC)    NIDDM  . PONV (postoperative nausea and vomiting)   . Sleep apnea    sleep study 2013, didnt finish study, left early    Past Surgical History:  Procedure Laterality Date  . BACK SURGERY     cervical neck fusion 2002  . FOOT SURGERY    . MASS EXCISION Left 09/02/2012   Procedure: MINOR EXCISION OF CYST LEFT RING FINGER A-3 PULLEY;  Surgeon: Cammie Sickle., MD;  Location: [facility];  Service: Orthopedics;  Laterality: Left;  . neck fusion    . OOPHORECTOMY    . right hand surgery    . WRIST SURGERY     TFCC repair 2003    There were no vitals filed for this visit.  Subjective Assessment - 07/06/19 0803    Subjective  4/10 bilateral heels. Better than I was last visit.    Currently in Pain?  Yes    Pain Score  4     Pain Location  Ankle    Pain Orientation  Left;Right    Pain Descriptors / Indicators  Burning    Pain Type  Chronic pain                       [facility] Adult PT Treatment/Exercise - 07/06/19 0001      Manual Therapy   Soft tissue mobilization  Bilateral Gastroc and lateral ankle , cross friction to achilles,  DF PROM     McConnell  Achilles LLE (long I strip 50% and then small transverse strip with 75% across Tendon)       Ankle Exercises: Standing   SLS  30sec right , 31  left     Heel Raises  10 reps   2 sets, toe raises 10 x2    Other Standing Ankle Exercises  4 inch lateral step ups x 10 each, sit-stand x 10       Ankle Exercises: Stretches   Soleus Stretch  3 reps;30 seconds    Gastroc Stretch  3 reps;30 seconds      Ankle Exercises: Supine   T-Band  combined Green band x 15 INV/DF, EVERSION/PF, PF with PT assist                   PT Long Term Goals - 07/04/19 3532      PT LONG TERM GOAL #1   Title  She will be independent with all HEP issued as of last visit    Time  6    Period  Weeks    Status  On-going      PT LONG TERM GOAL #2  Title  She will report pain becoming less after work hours, no more than 4/10 most days of week.    Baseline  reports less pain after PT sessions, unable to attend last 2 weeks    Time  6    Period  Weeks    Status  On-going      PT LONG TERM GOAL #3   Title  Pt will be able to walk up and down inclines with min pain    Baseline  pain moderate, unchanged    Time  6    Period  Weeks    Status  On-going      PT LONG TERM GOAL #4   Title  Pt will be able to stand for 30 sec on each leg SLS to improve stability and intrinisic foot stability.    Baseline  Feb 22nd : 4 sec Left, 9 sec right    Time  6    Period  Weeks    Status  On-going      PT LONG TERM GOAL #5   Title  Pt will be able to perform 10 bilateral heel raises without pain and improved clearance    Baseline  good ROM, burning, able to complete 10 reps    Time  6    Period  Weeks    Status  Partially Met            Plan - 07/06/19 0840    Clinical Impression Statement  Decreased pain today. She reports feeling much better after last session. She is holding SLS at 30 seconds bilateral and reports she has been practicing this at home. Began side step ups and sit-stands with good tolerance.    PT Next Visit Plan  manual to bilateral lower leg, ankle, continue closed chain if pain controlled    PT Home  Exercise Plan  bilateral heel raises sitting vs standing, wall stretchig for calf, green band PF       Patient will benefit from skilled therapeutic intervention in order to improve the following deficits and impairments:  Decreased balance, Decreased range of motion, Decreased strength, Increased fascial restricitons, Postural dysfunction, Difficulty walking, Increased edema, Decreased mobility, Pain, Obesity, Impaired flexibility  Visit Diagnosis: Pain in right ankle and joints of right foot  Muscle weakness (generalized)  Localized edema  Difficulty in walking, not elsewhere classified     Problem List Patient Active Problem List   Diagnosis Date Noted  . Class 3 obesity with body mass index (BMI) of 50.0 to 59.9 in adult 01/29/2017  . Polyclonal gammopathy determined by serum protein electrophoresis 08/21/2016  . MGUS (monoclonal gammopathy of unknown significance) 08/18/2016  . History of asthma 07/28/2016  . Raynaud's disease without gangrene 07/24/2016  . Primary osteoarthritis of both knees 07/24/2016  . DJD (degenerative joint disease), cervical 07/24/2016  . History of gastroesophageal reflux (GERD) 07/24/2016  . ANA positive 07/24/2016  . Trochanteric bursitis of both hips 07/24/2016  . Hyperlipidemia 06/18/2013  . Hematuria 06/18/2013    Dorene Ar, PTA 07/06/2019, 8:43 AM  [facility] [address] [place], [place], [address] Phone: [phone]   Fax:  [phone]  Name: Miranda Ross MRN: [id number] Date of Birth: 1975-07-24

## 2019-07-06 NOTE — Telephone Encounter (Signed)
Last Visit: 02/03/19 Next Visit: 08/04/19 UDS:02/03/19 Narc Agreement: 02/03/19  Last Fill 04/01/19  Okay to refill Tramadol?

## 2019-07-12 ENCOUNTER — Ambulatory Visit: Payer: 59

## 2019-07-14 ENCOUNTER — Other Ambulatory Visit: Payer: Self-pay

## 2019-07-14 ENCOUNTER — Ambulatory Visit: Payer: 59 | Attending: Podiatry

## 2019-07-14 DIAGNOSIS — R262 Difficulty in walking, not elsewhere classified: Secondary | ICD-10-CM | POA: Diagnosis present

## 2019-07-14 DIAGNOSIS — M6281 Muscle weakness (generalized): Secondary | ICD-10-CM | POA: Diagnosis present

## 2019-07-14 DIAGNOSIS — R600 Localized edema: Secondary | ICD-10-CM | POA: Insufficient documentation

## 2019-07-14 DIAGNOSIS — M25571 Pain in right ankle and joints of right foot: Secondary | ICD-10-CM | POA: Diagnosis present

## 2019-07-14 NOTE — Therapy (Signed)
[place], [place], [address] Phone: [phone]   Fax:  [phone]  Physical Therapy Treatment  Patient Details  Name: Miranda Ross MRN: [id number] Date of Birth: 1975/12/08 Referring Provider (PT): Dr. Inocencio Homes    Encounter Date: 07/14/2019  PT End of Session - 07/14/19 0854    Visit Number  9    Number of Visits  15    Date for PT Re-Evaluation  08/15/19    PT Start Time  0750    PT Stop Time  0845    PT Time Calculation (min)  55 min    Activity Tolerance  Patient tolerated treatment well    Behavior During Therapy  [facility] for tasks assessed/performed       Past Medical History:  Diagnosis Date  . Arthritis   . Asthma    uses albuterol inh prn  . Diabetes mellitus without complication (HCC)    NIDDM  . PONV (postoperative nausea and vomiting)   . Sleep apnea    sleep study 2013, didnt finish study, left early    Past Surgical History:  Procedure Laterality Date  . BACK SURGERY     cervical neck fusion 2002  . FOOT SURGERY    . MASS EXCISION Left 09/02/2012   Procedure: MINOR EXCISION OF CYST LEFT RING FINGER A-3 PULLEY;  Surgeon: Cammie Sickle., MD;  Location: [place];  Service: Orthopedics;  Laterality: Left;  . neck fusion    . OOPHORECTOMY    . right hand surgery    . WRIST SURGERY     TFCC repair 2003    There were no vitals filed for this visit.  Subjective Assessment - 07/14/19 0836    Subjective  4/10 bllat heels pre PT. 3/10 post PT. pt reports overall improvement.                       [facility] Adult PT Treatment/Exercise - 07/14/19 0001      Manual Therapy   Soft tissue mobilization  Bilateral Gastroc and lateral ankle , cross friction to B achilles and peroneal tendons,    McConnell  Achilles LLE (long I strip 50% and then small transverse strip with 75% across Tendon)       Ankle Exercises: Standing   SLS  30sec right , 35   left     Heel Raises  Both;10 reps   2 sets   Other Standing Ankle Exercises  4 inch lateral step ups x 10 each, sit-stand x 10       Ankle Exercises: Stretches   Soleus Stretch  3 reps;30 seconds    Gastroc Stretch  3 reps;30 seconds             PT Education - 07/14/19 0851    Education Details  Periodic use of XFM 3-4x daily to address scar tissue.    Person(s) Educated  Patient    Methods  Explanation;Demonstration    Comprehension  Verbalized understanding;Returned demonstration          PT Long Term Goals - 07/04/19 4315      PT LONG TERM GOAL #1   Title  She will be independent with all HEP issued as of last visit    Time  6    Period  Weeks    Status  On-going      PT LONG TERM GOAL #2   Title  She will  report pain becoming less after work hours, no more than 4/10 most days of week.    Baseline  reports less pain after PT sessions, unable to attend last 2 weeks    Time  6    Period  Weeks    Status  On-going      PT LONG TERM GOAL #3   Title  Pt will be able to walk up and down inclines with min pain    Baseline  pain moderate, unchanged    Time  6    Period  Weeks    Status  On-going      PT LONG TERM GOAL #4   Title  Pt will be able to stand for 30 sec on each leg SLS to improve stability and intrinisic foot stability.    Baseline  Feb 22nd : 4 sec Left, 9 sec right    Time  6    Period  Weeks    Status  On-going      PT LONG TERM GOAL #5   Title  Pt will be able to perform 10 bilateral heel raises without pain and improved clearance    Baseline  good ROM, burning, able to complete 10 reps    Time  6    Period  Weeks    Status  Partially Met            Plan - 07/14/19 0855    Clinical Impression Statement  Pt is making gradual progress c PT intervention. Pt returned demonstration of XFM. L SLS 35 sec, improved. R 30 sec limited by pain and balance.    PT Treatment/Interventions  ADLs/Self Care Home Management;Iontophoresis 4mg/ml  Dexamethasone;Electrical Stimulation;Moist Heat;Ultrasound;Gait training;Therapeutic activities;Neuromuscular re-education;Manual techniques;Patient/family education;Functional mobility training;Therapeutic exercise;Taping;Passive range of motion;Dry needling    PT Next Visit Plan  Assess response to pt completing XFM periodically. Progress closed chain exs if pain is controlled.    PT Home Exercise Plan  periodic XFM 3-4 x a day       Patient will benefit from skilled therapeutic intervention in order to improve the following deficits and impairments:  Decreased balance, Decreased range of motion, Decreased strength, Increased fascial restricitons, Postural dysfunction, Difficulty walking, Increased edema, Decreased mobility, Pain, Obesity, Impaired flexibility  Visit Diagnosis: Pain in right ankle and joints of right foot  Muscle weakness (generalized)  Localized edema  Difficulty in walking, not elsewhere classified     Problem List Patient Active Problem List   Diagnosis Date Noted  . Class 3 obesity with body mass index (BMI) of 50.0 to 59.9 in adult 01/29/2017  . Polyclonal gammopathy determined by serum protein electrophoresis 08/21/2016  . MGUS (monoclonal gammopathy of unknown significance) 08/18/2016  . History of asthma 07/28/2016  . Raynaud's disease without gangrene 07/24/2016  . Primary osteoarthritis of both knees 07/24/2016  . DJD (degenerative joint disease), cervical 07/24/2016  . History of gastroesophageal reflux (GERD) 07/24/2016  . ANA positive 07/24/2016  . Trochanteric bursitis of both hips 07/24/2016  . Hyperlipidemia 06/18/2013  . Hematuria 06/18/2013    Allen  Ralls MS, PT 07/14/2019, 9:13 AM  [facility] [address] , [place], [address] Phone: [phone]   Fax:  [phone]  Name: Miranda Ross MRN: [id number] Date of Birth: 08/07/1975   

## 2019-07-18 ENCOUNTER — Encounter: Payer: Self-pay | Admitting: Physical Therapy

## 2019-07-18 ENCOUNTER — Other Ambulatory Visit: Payer: Self-pay

## 2019-07-18 ENCOUNTER — Ambulatory Visit: Payer: 59 | Admitting: Physical Therapy

## 2019-07-18 DIAGNOSIS — M25571 Pain in right ankle and joints of right foot: Secondary | ICD-10-CM | POA: Diagnosis not present

## 2019-07-18 DIAGNOSIS — R262 Difficulty in walking, not elsewhere classified: Secondary | ICD-10-CM

## 2019-07-18 DIAGNOSIS — R600 Localized edema: Secondary | ICD-10-CM

## 2019-07-18 DIAGNOSIS — M6281 Muscle weakness (generalized): Secondary | ICD-10-CM

## 2019-07-18 NOTE — Therapy (Addendum)
[place], [place], [address] Phone: [phone]   Fax:  [phone]  Physical Therapy Treatment/Discharge  Patient Details  Name: Miranda Ross MRN: [id number] Date of Birth: February 21, 1976 Referring Provider (PT): Dr. Inocencio Homes    Encounter Date: 07/18/2019  PT End of Session - 07/18/19 1242    Visit Number  10    Number of Visits  15    Date for PT Re-Evaluation  08/15/19    PT Start Time  1751    PT Stop Time  1315    PT Time Calculation (min)  40 min       Past Medical History:  Diagnosis Date  . Arthritis   . Asthma    uses albuterol inh prn  . Diabetes mellitus without complication (HCC)    NIDDM  . PONV (postoperative nausea and vomiting)   . Sleep apnea    sleep study 2013, didnt finish study, left early    Past Surgical History:  Procedure Laterality Date  . BACK SURGERY     cervical neck fusion 2002  . FOOT SURGERY    . MASS EXCISION Left 09/02/2012   Procedure: MINOR EXCISION OF CYST LEFT RING FINGER A-3 PULLEY;  Surgeon: Cammie Sickle., MD;  Location: [facility];  Service: Orthopedics;  Laterality: Left;  . neck fusion    . OOPHORECTOMY    . right hand surgery    . WRIST SURGERY     TFCC repair 2003    There were no vitals filed for this visit.  Subjective Assessment - 07/18/19 1242    Subjective  5/10 both heels , I was on them alot cleaning.    Currently in Pain?  Yes    Pain Score  5     Pain Location  Ankle    Pain Orientation  Right;Left    Pain Descriptors / Indicators  Burning    Aggravating Factors   on feet alot    Pain Relieving Factors  inserts, elevation                       [facility] Adult PT Treatment/Exercise - 07/18/19 0001      Manual Therapy   Soft tissue mobilization  Bilateral Gastroc and lateral ankle , cross friction to B achilles and peroneal tendons,    McConnell  Achilles LLE (long I strip 50% and then small  transverse strip with 75% across Tendon)       Ankle Exercises: Standing   SLS  32sec right , 58  left     Heel Raises  Both;10 reps   2 sets   Toe Raise  20 reps    Other Standing Ankle Exercises  6 inch lateral step ups x 10 each, sit-stand x 10     Other Standing Ankle Exercises  Sled push 41f x 2, pull sled 42fx 2       Ankle Exercises: Stretches   Soleus Stretch  3 reps;30 seconds    Gastroc Stretch  3 reps;30 seconds                  PT Long Term Goals - 07/18/19 1402      PT LONG TERM GOAL #1   Title  She will be independent with all HEP issued as of last visit    Time  6    Period  Weeks    Status  On-going  PT LONG TERM GOAL #2   Title  She will report pain becoming less after work hours, no more than 4/10 most days of week.    Baseline  reports less pain after PT sessions, noting improved tolerance to walking    Time  6    Status  On-going      PT LONG TERM GOAL #3   Title  Pt will be able to walk up and down inclines with min pain    Baseline  pain moderate, unchanged, began incline work today    Time  6    Period  Weeks    Status  On-going      PT LONG TERM GOAL #4   Title  Pt will be able to stand for 30 sec on each leg SLS to improve stability and intrinisic foot stability.    Baseline  March 8: 32 Right, 58 Left    Time  6    Period  Weeks    Status  Achieved      PT LONG TERM GOAL #5   Title  Pt will be able to perform 10 bilateral heel raises without pain and improved clearance    Baseline  good ROM, burning, able to complete 10 reps    Time  6    Period  Weeks    Status  Partially Met            Plan - 07/18/19 1243    Clinical Impression Statement  Pt reports imprved tolerance to activity on feet like walking. Less intense pain with walking 2 [place] the city. Still difficulty but she is able to make it. SLS greater than 30 sec each leg, LTG#4 met. Began sled push and pull for incline training. She had some burning  after 2 passes. Repeated crossfriction and KT tape to bilateral heels. Progressing toward LTGs.    PT Next Visit Plan  Assess response to pt completing XFM periodically. Progress closed chain exs if pain is controlled.    PT Home Exercise Plan  periodic XFM 3-4 x a day,bilateral heel raises sitting vs standing, wall stretchig for calf, green band PF    Consulted and Agree with Plan of Care  Patient       Patient will benefit from skilled therapeutic intervention in order to improve the following deficits and impairments:  Decreased balance, Decreased range of motion, Decreased strength, Increased fascial restricitons, Postural dysfunction, Difficulty walking, Increased edema, Decreased mobility, Pain, Obesity, Impaired flexibility  Visit Diagnosis: Pain in right ankle and joints of right foot  Muscle weakness (generalized)  Localized edema  Difficulty in walking, not elsewhere classified     Problem List Patient Active Problem List   Diagnosis Date Noted  . Class 3 obesity with body mass index (BMI) of 50.0 to 59.9 in adult 01/29/2017  . Polyclonal gammopathy determined by serum protein electrophoresis 08/21/2016  . MGUS (monoclonal gammopathy of unknown significance) 08/18/2016  . History of asthma 07/28/2016  . Raynaud's disease without gangrene 07/24/2016  . Primary osteoarthritis of both knees 07/24/2016  . DJD (degenerative joint disease), cervical 07/24/2016  . History of gastroesophageal reflux (GERD) 07/24/2016  . ANA positive 07/24/2016  . Trochanteric bursitis of both hips 07/24/2016  . Hyperlipidemia 06/18/2013  . Hematuria 06/18/2013    Dorene Ar, PTA 07/18/2019, 2:08 PM  [facility] [address] [place], [place], [address] Phone: [phone]   Fax:  [phone]  Name: Miranda Ross  MRN: [id number] Date of Birth: 04-03-1976  PHYSICAL THERAPY DISCHARGE SUMMARY  Visits from Start of Care:  10  Current functional level related to goals / functional outcomes: See above    Remaining deficits: See new Achilles injury    Education / Equipment: HEP  Plan: Patient agrees to discharge.  Patient goals were partially met. Patient is being discharged due to a change in medical status.  ?????    Raeford Razor, PT 07/29/19 11:35 AM Phone: [phone] Fax: [phone]

## 2019-07-20 ENCOUNTER — Ambulatory Visit: Payer: 59 | Admitting: Physical Therapy

## 2019-07-21 ENCOUNTER — Ambulatory Visit: Payer: Self-pay

## 2019-07-21 ENCOUNTER — Ambulatory Visit (INDEPENDENT_AMBULATORY_CARE_PROVIDER_SITE_OTHER): Payer: 59 | Admitting: Orthopaedic Surgery

## 2019-07-21 ENCOUNTER — Encounter: Payer: Self-pay | Admitting: Orthopaedic Surgery

## 2019-07-21 ENCOUNTER — Other Ambulatory Visit: Payer: Self-pay

## 2019-07-21 VITALS — BP 141/62 | HR 70 | Ht 63.0 in | Wt 276.0 lb

## 2019-07-21 DIAGNOSIS — M25571 Pain in right ankle and joints of right foot: Secondary | ICD-10-CM | POA: Diagnosis not present

## 2019-07-21 DIAGNOSIS — S86011A Strain of right Achilles tendon, initial encounter: Secondary | ICD-10-CM | POA: Insufficient documentation

## 2019-07-21 NOTE — Progress Notes (Signed)
Office Visit Note   Patient: SAMAY ROLFE           Date of Birth: 1976-05-04           MRN: [id number] Visit Date: 07/21/2019              Requested by: Delrae Rend, MD [address] [place] [address] [place],  [place] [address] PCP: Delrae Rend, MD   Assessment & Plan: Visit Diagnoses:  1. Pain in right ankle and joints of right foot   2. Achilles tendon tear, right, initial encounter     Plan: Prescription for folding walker given.  Patient thinks she has one that she can borrow.  Cam boot applied.  Will obtain an MRI scan rule out partial versus complete tear of Achilles tendon we discussed the possibility of operative intervention being needed.  We stressed the importance of diabetic control.  Work slip given for no work for 4 weeks.  We will make her appointment for MRI review next week.  Follow-Up Instructions: Return in about 1 week (around 07/28/2019).   Orders:  Orders Placed This Encounter  Procedures  . XR Ankle Complete Right  . MR Ankle Right w/o contrast   No orders of the defined types were placed in this encounter.     Procedures: No procedures performed   Clinical Data: No additional findings.   Subjective: Chief Complaint  Patient presents with  . Right Ankle - Pain    DOI 07/21/2019    HPI 44 year old female was walking across the parking lot this morning felt a pop in her right Achilles and states she could not walk after that.  She is in a wheelchair states her temps and ankle range of motion is extremely painful posteriorly.  She states she has had previous Topaz procedure opposite left Achilles tendon.  C5-6 fusion November 2002.  She is diabetic on Humalog 40 units 3 times daily and states she had not been doing well in the past with her insulin and had high A1c.  Recently she has been doing better.  She works as a Occupational psychologist and is very familiar with the importance of medication compliance and potential medical consequences of poor  compliance.  Patient is on chronic pain medication management on tramadol.  Review of Systems positive for poorly controlled diabetes.  Previous Topaz procedure opposite left Achilles tendon.  Morbid obesity low albumin, arthritis asthma.  Chronic pain management on tramadol twice daily.   Objective: Vital Signs: BP (!) 141/62   Pulse 70   Ht 5\' 3"  (1.6 m)   Wt 276 lb (125.2 kg)   BMI 48.89 kg/m   Physical Exam Constitutional:      Appearance: She is well-developed.  HENT:     Head: Normocephalic.     Right Ear: External ear normal.     Left Ear: External ear normal.  Eyes:     Pupils: Pupils are equal, round, and reactive to light.  Neck:     Thyroid: No thyromegaly.     Trachea: No tracheal deviation.  Cardiovascular:     Rate and Rhythm: Normal rate.  Pulmonary:     Effort: Pulmonary effort is normal.  Abdominal:     Palpations: Abdomen is soft.  Skin:    General: Skin is warm and dry.  Neurological:     Mental Status: She is alert and oriented to person, place, and time.  Psychiatric:        Behavior: Behavior normal.  Ortho Exam patient has equivocal Thompson test that she hollers withdraws with pain with calf palpation.  Palpable defect consistent with likely partial tear of the distal Achilles tendon.  There is gap is about 1 cm.  No plantar foot lesions.  Specialty Comments:  No specialty comments available.  Imaging: XR Ankle Complete Right  Result Date: 07/21/2019 Three-view x-rays right ankle obtained and reviewed.  This shows Achilles calcific tendinopathy located 3 cm from the insertion site of the Achilles tendon.  Patient has Haglund's deformity. Impression: Calcific tendinopathy.  Suspicious for distal Achilles tendon rupture.    PMFS History: Patient Active Problem List   Diagnosis Date Noted  . Achilles tendon tear, right, initial encounter 07/21/2019  . Class 3 obesity with body mass index (BMI) of 50.0 to 59.9 in adult 01/29/2017  .  Polyclonal gammopathy determined by serum protein electrophoresis 08/21/2016  . MGUS (monoclonal gammopathy of unknown significance) 08/18/2016  . History of asthma 07/28/2016  . Raynaud's disease without gangrene 07/24/2016  . Primary osteoarthritis of both knees 07/24/2016  . DJD (degenerative joint disease), cervical 07/24/2016  . History of gastroesophageal reflux (GERD) 07/24/2016  . ANA positive 07/24/2016  . Trochanteric bursitis of both hips 07/24/2016  . Hyperlipidemia 06/18/2013  . Hematuria 06/18/2013   Past Medical History:  Diagnosis Date  . Arthritis   . Asthma    uses albuterol inh prn  . Diabetes mellitus without complication (HCC)    NIDDM  . PONV (postoperative nausea and vomiting)   . Sleep apnea    sleep study 2013, didnt finish study, left early    Family History  Problem Relation Age of Onset  . Diabetes Mother   . Heart disease Mother   . Depression Mother   . Diverticulosis Mother   . Non-Hodgkin's lymphoma Father   . Scleroderma Sister     Past Surgical History:  Procedure Laterality Date  . BACK SURGERY     cervical neck fusion 2002  . FOOT SURGERY    . MASS EXCISION Left 09/02/2012   Procedure: MINOR EXCISION OF CYST LEFT RING FINGER A-3 PULLEY;  Surgeon: Cammie Sickle., MD;  Location: [facility];  Service: Orthopedics;  Laterality: Left;  . neck fusion    . OOPHORECTOMY    . right hand surgery    . WRIST SURGERY     TFCC repair 2003   Social History   Occupational History  . Not on file  Tobacco Use  . Smoking status: Never Smoker  . Smokeless tobacco: Never Used  Substance and Sexual Activity  . Alcohol use: No  . Drug use: No  . Sexual activity: Yes    Birth control/protection: I.U.D.

## 2019-07-23 ENCOUNTER — Ambulatory Visit: Payer: 59

## 2019-07-26 NOTE — Progress Notes (Deleted)
Office Visit Note  Patient: Miranda Ross             Date of Birth: [birth year]           MRN: [id number]             PCP: Everardo Beals, NP Referring: Delrae Rend, MD Visit Date: 08/04/2019 Occupation: @GUAROCC @  Subjective:  No chief complaint on file.   History of Present Illness: Miranda Ross is a 44 y.o. female ***   Activities of Daily Living:  Patient reports morning stiffness for *** {minute/hour:19697}.   Patient {ACTIONS;DENIES/REPORTS:21021675::"Denies"} nocturnal pain.  Difficulty dressing/grooming: {ACTIONS;DENIES/REPORTS:21021675::"Denies"} Difficulty climbing stairs: {ACTIONS;DENIES/REPORTS:21021675::"Denies"} Difficulty getting out of chair: {ACTIONS;DENIES/REPORTS:21021675::"Denies"} Difficulty using hands for taps, buttons, cutlery, and/or writing: {ACTIONS;DENIES/REPORTS:21021675::"Denies"}  No Rheumatology ROS completed.   PMFS History:  Patient Active Problem List   Diagnosis Date Noted  . Achilles tendon tear, right, initial encounter 07/21/2019  . Class 3 obesity with body mass index (BMI) of 50.0 to 59.9 in adult 01/29/2017  . Polyclonal gammopathy determined by serum protein electrophoresis 08/21/2016  . MGUS (monoclonal gammopathy of unknown significance) 08/18/2016  . History of asthma 07/28/2016  . Raynaud's disease without gangrene 07/24/2016  . Primary osteoarthritis of both knees 07/24/2016  . DJD (degenerative joint disease), cervical 07/24/2016  . History of gastroesophageal reflux (GERD) 07/24/2016  . ANA positive 07/24/2016  . Trochanteric bursitis of both hips 07/24/2016  . Hyperlipidemia 06/18/2013  . Hematuria 06/18/2013    Past Medical History:  Diagnosis Date  . Arthritis   . Asthma    uses albuterol inh prn  . Diabetes mellitus without complication (HCC)    NIDDM  . PONV (postoperative nausea and vomiting)   . Sleep apnea    sleep study 2013, didnt finish study, left early    Family History  Problem Relation  Age of Onset  . Diabetes Mother   . Heart disease Mother   . Depression Mother   . Diverticulosis Mother   . Non-Hodgkin's lymphoma Father   . Scleroderma Sister    Past Surgical History:  Procedure Laterality Date  . BACK SURGERY     cervical neck fusion 2002  . FOOT SURGERY    . MASS EXCISION Left 09/02/2012   Procedure: MINOR EXCISION OF CYST LEFT RING FINGER A-3 PULLEY;  Surgeon: Cammie Sickle., MD;  Location: [facility];  Service: Orthopedics;  Laterality: Left;  . neck fusion    . OOPHORECTOMY    . right hand surgery    . WRIST SURGERY     TFCC repair 2003   Social History   Social History Narrative  . Not on file    There is no immunization history on file for this patient.   Objective: Vital Signs: There were no vitals taken for this visit.   Physical Exam   Musculoskeletal Exam: ***  CDAI Exam: CDAI Score: -- Patient Global: --; Provider Global: -- Swollen: --; Tender: -- Joint Exam 08/04/2019   No joint exam has been documented for this visit   There is currently no information documented on the homunculus. Go to the Rheumatology activity and complete the homunculus joint exam.  Investigation: No additional findings.  Imaging: XR Ankle Complete Right  Result Date: 07/21/2019 Three-view x-rays right ankle obtained and reviewed.  This shows Achilles calcific tendinopathy located 3 cm from the insertion site of the Achilles tendon.  Patient has Haglund's deformity. Impression: Calcific tendinopathy.  Suspicious for distal Achilles tendon rupture.  Recent Labs: Lab Results  Component Value Date   WBC 12.1 (H) 08/18/2016   HGB 13.6 08/18/2016   PLT 325 08/18/2016   NA 136 08/18/2016   K 3.9 08/18/2016   CL 96 07/31/2016   CO2 26 08/18/2016   GLUCOSE 140 08/18/2016   BUN 11.3 08/18/2016   CREATININE 0.8 08/18/2016   BILITOT 0.46 08/18/2016   ALKPHOS 124 08/18/2016   AST 22 08/18/2016   ALT 21 08/18/2016   PROT 8.3  08/18/2016   ALBUMIN 2.9 (L) 08/18/2016   CALCIUM 9.3 08/18/2016   GFRAA 139 07/31/2016    Speciality Comments: No specialty comments available.  Procedures:  No procedures performed Allergies: Patient has no known allergies.   Assessment / Plan:     Visit Diagnoses: No diagnosis found.  Orders: No orders of the defined types were placed in this encounter.  No orders of the defined types were placed in this encounter.   Face-to-face time spent with patient was *** minutes. Greater than 50% of time was spent in counseling and coordination of care.  Follow-Up Instructions: No follow-ups on file.   Earnestine Mealing, CMA  Note - This record has been created using Editor, commissioning.  Chart creation errors have been sought, but may not always  have been located. Such creation errors do not reflect on  the standard of medical care.

## 2019-07-27 ENCOUNTER — Other Ambulatory Visit: Payer: Self-pay

## 2019-07-27 ENCOUNTER — Telehealth: Payer: Self-pay | Admitting: Orthopaedic Surgery

## 2019-07-27 ENCOUNTER — Ambulatory Visit (HOSPITAL_COMMUNITY)
Admission: RE | Admit: 2019-07-27 | Discharge: 2019-07-27 | Disposition: A | Discharge status: Home / Self Care | Payer: 59 | Source: Ambulatory Visit | Attending: Orthopaedic Surgery | Admitting: Orthopaedic Surgery

## 2019-07-27 DIAGNOSIS — M25571 Pain in right ankle and joints of right foot: Secondary | ICD-10-CM | POA: Insufficient documentation

## 2019-07-27 DIAGNOSIS — S86011A Strain of right Achilles tendon, initial encounter: Secondary | ICD-10-CM | POA: Insufficient documentation

## 2019-07-27 NOTE — Telephone Encounter (Signed)
Tracy/[facility] called and stated that she has results and needs to speak directly with someone from [place] office.  [phone]

## 2019-07-27 NOTE — Telephone Encounter (Signed)
Report from ankle MRI  IMPRESSION: Full-thickness near-complete tear of the Achilles tendon with 3.5 cm retraction. A portion of the posterior calcaneal enthesophyte is fractured and retracted with the distal tendon. A small portion of the medial tendon fibers remain intact inserting onto the posterior calcaneus.

## 2019-07-28 ENCOUNTER — Other Ambulatory Visit (HOSPITAL_COMMUNITY)
Admission: RE | Admit: 2019-07-28 | Discharge: 2019-07-28 | Disposition: A | Discharge status: Home / Self Care | Payer: 59 | Source: Ambulatory Visit | Attending: Orthopaedic Surgery | Admitting: Orthopaedic Surgery

## 2019-07-28 ENCOUNTER — Other Ambulatory Visit: Payer: Self-pay

## 2019-07-28 ENCOUNTER — Ambulatory Visit (INDEPENDENT_AMBULATORY_CARE_PROVIDER_SITE_OTHER): Payer: 59 | Admitting: Orthopaedic Surgery

## 2019-07-28 ENCOUNTER — Encounter: Payer: Self-pay | Admitting: Orthopaedic Surgery

## 2019-07-28 VITALS — BP 154/107 | HR 98 | Ht 63.0 in | Wt 276.0 lb

## 2019-07-28 DIAGNOSIS — Z01812 Encounter for preprocedural laboratory examination: Secondary | ICD-10-CM | POA: Insufficient documentation

## 2019-07-28 DIAGNOSIS — S86019A Strain of unspecified Achilles tendon, initial encounter: Secondary | ICD-10-CM | POA: Diagnosis not present

## 2019-07-28 DIAGNOSIS — Z20822 Contact with and (suspected) exposure to covid-19: Secondary | ICD-10-CM | POA: Diagnosis not present

## 2019-07-28 DIAGNOSIS — S86011A Strain of right Achilles tendon, initial encounter: Secondary | ICD-10-CM | POA: Diagnosis not present

## 2019-07-28 DIAGNOSIS — X58XXXA Exposure to other specified factors, initial encounter: Secondary | ICD-10-CM | POA: Insufficient documentation

## 2019-07-28 NOTE — Progress Notes (Signed)
Office Visit Note   Patient: Miranda Ross           Date of Birth: Jul 25, 1975           MRN: [id number] Visit Date: 07/28/2019              Requested by: Everardo Beals, NP [place],  [place] [address] PCP: Everardo Beals, NP   Assessment & Plan: Visit Diagnoses:  1. Achilles tendon tear, right, initial encounter     Plan: Patient with full-thickness tear of the Achilles tendon with retraction more than an inch and a half.  Portion of the tendon looks like it has been ripped off the bone and may require bone anchor to secure that portion back to the calcaneus.  She has some is increased risk factors with BMI greater than 48, opposite ankle Achilles tendinopathy which has previously been treated for some partial tearing.  Diabetes with noncompliance and significantly elevated A1c's although she states since I saw her last she has been taking her insulin as she supposed to.  Pain management on tramadol with increased falling risk.  Even with a cam boot she is not able to ambulate without weightbearing on her ankle and will be at increased risk for postoperative tearing of the repair.  She is not a candidate for nonop treatment with casting.  Plan open operative fixation and hopefully she can be discharged the following day although with her opposite Achilles tendinopathy high BMI, uncontrolled diabetes, hypertension today 154/107 she may require 2 nights in the hospital.  I am aware that many patients have this done with the block and go home the same day and occasionally the following day however I do not think this is likely to be the case with this patient do the above factors.  I discussed with patient and her insurance may just approve observation but if she stays with longer due to problems then her visit could be extended at that time and communicated to her insurance company.  Risks of surgery discussed including rerupture, infection.  The importance of diabetic  control was discussed in detail.  She has borrowed a knee roller and has been practicing using this as instructed she has crutches and also has a walker but at this point does not have a wheelchair.  We will schedule surgery in the next several days for Achilles tendon repair.  We discussed postoperative mobilization for 2 to 3 months.  Follow-Up Instructions: No follow-ups on file.   Orders:  No orders of the defined types were placed in this encounter.  No orders of the defined types were placed in this encounter.     Procedures: No procedures performed   Clinical Data: No additional findings.   Subjective: Chief Complaint  Patient presents with  . Right Ankle - Follow-up    MRI Review    HPI 44 year old female returns post MRI scan which she reviewed on MyChart and is aware that she has a full-thickness Achilles tendon tear.  She has a gap of 3.5 cm a portion of the tendon looks like it was ripped off the bone some portion is through the substance of the tendon.  She has chronic tendinopathy in that area work for as expected.  She has had previous treatment for tendinopathy opposite Achilles tendon for tendinopathy in the past.  Continued other problems include morbid obesity noncompliance chronic pain management with tramadol, diabetic neuropathy with poor balance.  Review of Systems Previous Topaz  procedure absent left Achilles.  Morbid obesity low albumin none controlled diabetes with noncompliance taking insulin in the past.  Chronic pain management on tramadol morbid obesity BMI greater than 48.  Otherwise noncontributory to HPI.  Objective: Vital Signs: BP (!) 154/107   Pulse 98   Ht 5\' 3"  (1.6 m)   Wt 276 lb (125.2 kg)   BMI 48.89 kg/m   Physical Exam Constitutional:      Appearance: She is well-developed.  HENT:     Head: Normocephalic.     Right Ear: External ear normal.     Left Ear: External ear normal.  Eyes:     Pupils: Pupils are equal, round, and  reactive to light.  Neck:     Thyroid: No thyromegaly.     Trachea: No tracheal deviation.  Cardiovascular:     Rate and Rhythm: Normal rate.  Pulmonary:     Effort: Pulmonary effort is normal.  Abdominal:     Palpations: Abdomen is soft.  Skin:    General: Skin is warm and dry.  Neurological:     Mental Status: She is alert and oriented to person, place, and time.  Psychiatric:        Behavior: Behavior normal.     Ortho Exam patient has palpable gap Achilles tendon over right ankle.  No plantar foot lesions that are acute right or left.  Good capillary refill.  Specialty Comments:  No specialty comments available.  Imaging: CLINICAL DATA:  Achilles tendon tear  EXAM: MRI OF THE RIGHT ANKLE WITHOUT CONTRAST  TECHNIQUE: Multiplanar, multisequence MR imaging of the ankle was performed. No intravenous contrast was administered.  COMPARISON:  None available  FINDINGS: TENDONS  Peroneal: Intact peroneus longus and peroneus brevis tendons.  Posteromedial: Intact tibialis posterior, flexor hallucis longus and flexor digitorum longus tendons.  Anterior: Intact tibialis anterior, extensor hallucis longus and extensor digitorum longus tendons.  Achilles: Full-thickness near complete tear of the Achilles tendon with bony avulsion of a portion of the posterior calcaneal enthesophyte which remains attached to the retracted tendon (series 5, images 11-12). Tendon is retracted 3.5 cm with fluid gap. A small portion of the medial most tendon fibers remain intact inserting onto the posterior calcaneus (series 6, image 13; series 2, images 23-30). The torn and retracted portion of the distal tendon is thickened and intermediate in signal suggesting underlying tendinosis.  Plantar Fascia: Intact. Prominent enthesophyte at the calcaneal attachment without edema.  LIGAMENTS  Lateral: Intact.  Medial: Intact.  CARTILAGE  Ankle Joint: No joint effusion or  chondral defect.  Subtalar Joints/Sinus Tarsi: No joint effusion or chondral defect.  Bones: Large bidirectional calcaneal enthesophytes with fractured Achilles enthesophyte, as described above. Large os trigonum. Mild degenerative changes within the midfoot. No malalignment.  Other: Soft tissue swelling at the posterior aspect of the lower leg/ankle.  IMPRESSION: Full-thickness near-complete tear of the Achilles tendon with 3.5 cm retraction. A portion of the posterior calcaneal enthesophyte is fractured and retracted with the distal tendon. A small portion of the medial tendon fibers remain intact inserting onto the posterior calcaneus.  These results will be called to the ordering clinician or representative by the Radiologist Assistant, and communication documented in the PACS or [employer].   Electronically Signed   By: Davina Poke D.O.   On: 07/27/2019 15:08    PMFS History: Patient Active Problem List   Diagnosis Date Noted  . Achilles tendon tear, right, initial encounter 07/21/2019  . Class 3 obesity with  body mass index (BMI) of 50.0 to 59.9 in adult 01/29/2017  . Polyclonal gammopathy determined by serum protein electrophoresis 08/21/2016  . MGUS (monoclonal gammopathy of unknown significance) 08/18/2016  . History of asthma 07/28/2016  . Raynaud's disease without gangrene 07/24/2016  . Primary osteoarthritis of both knees 07/24/2016  . DJD (degenerative joint disease), cervical 07/24/2016  . History of gastroesophageal reflux (GERD) 07/24/2016  . ANA positive 07/24/2016  . Trochanteric bursitis of both hips 07/24/2016  . Hyperlipidemia 06/18/2013  . Hematuria 06/18/2013   Past Medical History:  Diagnosis Date  . Arthritis   . Asthma    uses albuterol inh prn  . Diabetes mellitus without complication (HCC)    NIDDM  . PONV (postoperative nausea and vomiting)   . Sleep apnea    sleep study 2013, didnt finish study, left early      Family History  Problem Relation Age of Onset  . Diabetes Mother   . Heart disease Mother   . Depression Mother   . Diverticulosis Mother   . Non-Hodgkin's lymphoma Father   . Scleroderma Sister     Past Surgical History:  Procedure Laterality Date  . BACK SURGERY     cervical neck fusion 2002  . FOOT SURGERY    . MASS EXCISION Left 09/02/2012   Procedure: MINOR EXCISION OF CYST LEFT RING FINGER A-3 PULLEY;  Surgeon: Cammie Sickle., MD;  Location: [facility];  Service: Orthopedics;  Laterality: Left;  . neck fusion    . OOPHORECTOMY    . right hand surgery    . WRIST SURGERY     TFCC repair 2003   Social History   Occupational History  . Not on file  Tobacco Use  . Smoking status: Never Smoker  . Smokeless tobacco: Never Used  Substance and Sexual Activity  . Alcohol use: No  . Drug use: No  . Sexual activity: Yes    Birth control/protection: I.U.D.

## 2019-07-29 ENCOUNTER — Encounter (HOSPITAL_COMMUNITY): Payer: Self-pay | Admitting: Orthopaedic Surgery

## 2019-07-29 ENCOUNTER — Other Ambulatory Visit: Payer: Self-pay

## 2019-07-29 LAB — SARS CORONAVIRUS 2 (TAT 6-24 HRS): SARS Coronavirus 2: NEGATIVE

## 2019-07-29 NOTE — Progress Notes (Signed)
Patient denies shortness of breath, fever, cough and chest pain.  PCP - Dr. Delrae Rend Cardiologist - denies Rheumatology - Dr Bo Merino  Chest x-ray - denies EKG - denies Stress Test - denies ECHO - denies Cardiac Cath - denies  Sleep Study - Did not complete study (2013).  No CPAP  Fasting Blood Sugar -  140-150s Checks Blood Sugar - 3 times a day  . THE NIGHT BEFORE SURGERY, take 64 units of Glargine (Lantus) insulin.     . THE MORNING OF SURGERY, do not take any Humalog insulin unless your CBG is greater than 220 mg/dL, then you may take  of your sliding scale (correction) dose.  . If your blood sugar is less than 70 mg/dL, you will need to treat for low blood sugar: o Treat a low blood sugar (less than 70 mg/dL) with  cup of clear juice (cranberry or Bero), 4 glucose tablets, OR glucose gel. o Recheck blood sugar in 15 minutes after treatment (to make sure it is greater than 70 mg/dL). If your blood sugar is not greater than 70 mg/dL on recheck, call [phone] for further instructions.  Anesthesia review: No  ERAS:  Clears til 4:30 am DOS, no drink.  As of now, STOP taking any Aspirin (unless otherwise instructed by your surgeon), Aleve, Naproxen, Ibuprofen, Motrin, Advil, Goody's, BC's, all herbal medications, fish oil, and all vitamins.   Coronavirus Screening Covid test on 07/28/19 was negative.  Patient verbalized understanding of instructions that were given via phone.

## 2019-07-31 NOTE — H&P (Signed)
Patient: Miranda Ross                                            Date of Birth: 02-16-1976                                                    MRN: [id number] Visit Date: 07/28/2019                                                                     Requested by: Everardo Beals, NP [place],  [place] [address] PCP: Everardo Beals, NP   Assessment & Plan: Visit Diagnoses:  1. Achilles tendon tear, right, initial encounter     Plan: Patient with full-thickness tear of the Achilles tendon with retraction more than an inch and a half.  Portion of the tendon looks like it has been ripped off the bone and may require bone anchor to secure that portion back to the calcaneus.  She has some is increased risk factors with BMI greater than 48, opposite ankle Achilles tendinopathy which has previously been treated for some partial tearing.  Diabetes with noncompliance and significantly elevated A1c's although she states since I saw her last she has been taking her insulin as she supposed to.  Pain management on tramadol with increased falling risk.  Even with a cam boot she is not able to ambulate without weightbearing on her ankle and will be at increased risk for postoperative tearing of the repair.  She is not a candidate for nonop treatment with casting.  Plan open operative fixation and hopefully she can be discharged the following day although with her opposite Achilles tendinopathy high BMI, uncontrolled diabetes, hypertension today 154/107 she may require 2 nights in the hospital.  I am aware that many patients have this done with the block and go home the same day and occasionally the following day however I do not think this is likely to be the case with this patient do the above factors.  I discussed with patient and her insurance may just approve observation but if she stays with longer due to problems then her visit could be extended at that time and  communicated to her insurance company.  Risks of surgery discussed including rerupture, infection.  The importance of diabetic control was discussed in detail.  She has borrowed a knee roller and has been practicing using this as instructed she has crutches and also has a walker but at this point does not have a wheelchair.  We will schedule surgery in the next several days for Achilles tendon repair.  We discussed postoperative mobilization for 2 to 3 months.  Follow-Up Instructions: No follow-ups on file.   Orders:  No orders of the defined types were placed in this encounter.  No orders of the defined types were placed in this encounter.     Procedures: No procedures performed   Clinical Data: No additional findings.   Subjective:  Chief Complaint  Patient presents with  . Right Ankle - Follow-up    MRI Review    HPI 44 year old female returns post MRI scan which she reviewed on MyChart and is aware that she has a full-thickness Achilles tendon tear.  She has a gap of 3.5 cm a portion of the tendon looks like it was ripped off the bone some portion is through the substance of the tendon.  She has chronic tendinopathy in that area work for as expected.  She has had previous treatment for tendinopathy opposite Achilles tendon for tendinopathy in the past.  Continued other problems include morbid obesity noncompliance chronic pain management with tramadol, diabetic neuropathy with poor balance.  Review of Systems Previous Topaz procedure absent left Achilles.  Morbid obesity low albumin none controlled diabetes with noncompliance taking insulin in the past.  Chronic pain management on tramadol morbid obesity BMI greater than 48.  Otherwise noncontributory to HPI.  Objective: Vital Signs: BP (!) 154/107   Pulse 98   Ht 5\' 3"  (1.6 m)   Wt 276 lb (125.2 kg)   BMI 48.89 kg/m   Physical Exam Constitutional:      Appearance: She is well-developed.  HENT:      Head: Normocephalic.     Right Ear: External ear normal.     Left Ear: External ear normal.  Eyes:     Pupils: Pupils are equal, round, and reactive to light.  Neck:     Thyroid: No thyromegaly.     Trachea: No tracheal deviation.  Cardiovascular:     Rate and Rhythm: Normal rate.  Pulmonary:     Effort: Pulmonary effort is normal.  Abdominal:     Palpations: Abdomen is soft.  Skin:    General: Skin is warm and dry.  Neurological:     Mental Status: She is alert and oriented to person, place, and time.  Psychiatric:        Behavior: Behavior normal.     Ortho Exam patient has palpable gap Achilles tendon over right ankle.  No plantar foot lesions that are acute right or left.  Good capillary refill.  Specialty Comments:  No specialty comments available.  Imaging: CLINICAL DATA: Achilles tendon tear  EXAM: MRI OF THE RIGHT ANKLE WITHOUT CONTRAST  TECHNIQUE: Multiplanar, multisequence MR imaging of the ankle was performed. No intravenous contrast was administered.  COMPARISON: None available  FINDINGS: TENDONS  Peroneal: Intact peroneus longus and peroneus brevis tendons.  Posteromedial: Intact tibialis posterior, flexor hallucis longus and flexor digitorum longus tendons.  Anterior: Intact tibialis anterior, extensor hallucis longus and extensor digitorum longus tendons.  Achilles: Full-thickness near complete tear of the Achilles tendon with bony avulsion of a portion of the posterior calcaneal enthesophyte which remains attached to the retracted tendon (series 5, images 11-12). Tendon is retracted 3.5 cm with fluid gap. A small portion of the medial most tendon fibers remain intact inserting onto the posterior calcaneus (series 6, image 13; series 2, images 23-30). The torn and retracted portion of the distal tendon is thickened and intermediate in signal suggesting underlying tendinosis.  Plantar Fascia: Intact. Prominent enthesophyte at  the calcaneal attachment without edema.  LIGAMENTS  Lateral: Intact.  Medial: Intact.  CARTILAGE  Ankle Joint: No joint effusion or chondral defect.  Subtalar Joints/Sinus Tarsi: No joint effusion or chondral defect.  Bones: Large bidirectional calcaneal enthesophytes with fractured Achilles enthesophyte, as described above. Large os trigonum. Mild degenerative changes within the midfoot. No malalignment.  Other: Soft  tissue swelling at the posterior aspect of the lower leg/ankle.  IMPRESSION: Full-thickness near-complete tear of the Achilles tendon with 3.5 cm retraction. A portion of the posterior calcaneal enthesophyte is fractured and retracted with the distal tendon. A small portion of the medial tendon fibers remain intact inserting onto the posterior calcaneus.  These results will be called to the ordering clinician or representative by the Radiologist Assistant, and communication documented in the PACS or [employer].   Electronically Signed By: Davina Poke D.O. On: 07/27/2019 15:08    PMFS History:     Patient Active Problem List   Diagnosis Date Noted  . Achilles tendon tear, right, initial encounter 07/21/2019  . Class 3 obesity with body mass index (BMI) of 50.0 to 59.9 in adult 01/29/2017  . Polyclonal gammopathy determined by serum protein electrophoresis 08/21/2016  . MGUS (monoclonal gammopathy of unknown significance) 08/18/2016  . History of asthma 07/28/2016  . Raynaud's disease without gangrene 07/24/2016  . Primary osteoarthritis of both knees 07/24/2016  . DJD (degenerative joint disease), cervical 07/24/2016  . History of gastroesophageal reflux (GERD) 07/24/2016  . ANA positive 07/24/2016  . Trochanteric bursitis of both hips 07/24/2016  . Hyperlipidemia 06/18/2013  . Hematuria 06/18/2013       Past Medical History:  Diagnosis Date  . Arthritis   . Asthma    uses albuterol inh prn  . Diabetes  mellitus without complication (HCC)    NIDDM  . PONV (postoperative nausea and vomiting)   . Sleep apnea    sleep study 2013, didnt finish study, left early         Family History  Problem Relation Age of Onset  . Diabetes Mother   . Heart disease Mother   . Depression Mother   . Diverticulosis Mother   . Non-Hodgkin's lymphoma Father   . Scleroderma Sister          Past Surgical History:  Procedure Laterality Date  . BACK SURGERY     cervical neck fusion 2002  . FOOT SURGERY    . MASS EXCISION Left 09/02/2012   Procedure: MINOR EXCISION OF CYST LEFT RING FINGER A-3 PULLEY;  Surgeon: Cammie Sickle., MD;  Location: [facility];  Service: Orthopedics;  Laterality: Left;  . neck fusion    . OOPHORECTOMY    . right hand surgery    . WRIST SURGERY     TFCC repair 2003   Social History        Occupational History  . Not on file  Tobacco Use  . Smoking status: Never Smoker  . Smokeless tobacco: Never Used  Substance and Sexual Activity  . Alcohol use: No  . Drug use: No  . Sexual activity: Yes    Birth control/protection: I.U.D.

## 2019-08-01 ENCOUNTER — Encounter (HOSPITAL_COMMUNITY): Payer: Self-pay | Admitting: Orthopaedic Surgery

## 2019-08-01 ENCOUNTER — Inpatient Hospital Stay (HOSPITAL_COMMUNITY): Payer: 59 | Admitting: Anesthesiology

## 2019-08-01 ENCOUNTER — Inpatient Hospital Stay (HOSPITAL_COMMUNITY)
Admission: RE | Admit: 2019-08-01 | Discharge: 2019-08-02 | DRG: 501 | Disposition: A | Discharge status: Home / Self Care | Payer: 59 | Attending: Orthopaedic Surgery | Admitting: Orthopaedic Surgery

## 2019-08-01 ENCOUNTER — Other Ambulatory Visit: Payer: Self-pay

## 2019-08-01 ENCOUNTER — Encounter (HOSPITAL_COMMUNITY)
Admission: RE | Disposition: A | Discharge status: Home / Self Care | Payer: Self-pay | Source: Home / Self Care | Attending: Orthopaedic Surgery

## 2019-08-01 DIAGNOSIS — Z9119 Patient's noncompliance with other medical treatment and regimen: Secondary | ICD-10-CM | POA: Diagnosis not present

## 2019-08-01 DIAGNOSIS — Z6841 Body Mass Index (BMI) 40.0 and over, adult: Secondary | ICD-10-CM

## 2019-08-01 DIAGNOSIS — Z981 Arthrodesis status: Secondary | ICD-10-CM

## 2019-08-01 DIAGNOSIS — Z807 Family history of other malignant neoplasms of lymphoid, hematopoietic and related tissues: Secondary | ICD-10-CM | POA: Diagnosis not present

## 2019-08-01 DIAGNOSIS — E1140 Type 2 diabetes mellitus with diabetic neuropathy, unspecified: Secondary | ICD-10-CM | POA: Diagnosis present

## 2019-08-01 DIAGNOSIS — J45909 Unspecified asthma, uncomplicated: Secondary | ICD-10-CM | POA: Diagnosis present

## 2019-08-01 DIAGNOSIS — Z8489 Family history of other specified conditions: Secondary | ICD-10-CM

## 2019-08-01 DIAGNOSIS — Z8379 Family history of other diseases of the digestive system: Secondary | ICD-10-CM

## 2019-08-01 DIAGNOSIS — G4730 Sleep apnea, unspecified: Secondary | ICD-10-CM | POA: Diagnosis present

## 2019-08-01 DIAGNOSIS — S86011A Strain of right Achilles tendon, initial encounter: Secondary | ICD-10-CM | POA: Diagnosis present

## 2019-08-01 DIAGNOSIS — Z9114 Patient's other noncompliance with medication regimen: Secondary | ICD-10-CM

## 2019-08-01 DIAGNOSIS — Z8249 Family history of ischemic heart disease and other diseases of the circulatory system: Secondary | ICD-10-CM | POA: Diagnosis not present

## 2019-08-01 DIAGNOSIS — E1165 Type 2 diabetes mellitus with hyperglycemia: Secondary | ICD-10-CM | POA: Diagnosis present

## 2019-08-01 DIAGNOSIS — K219 Gastro-esophageal reflux disease without esophagitis: Secondary | ICD-10-CM | POA: Diagnosis present

## 2019-08-01 DIAGNOSIS — D472 Monoclonal gammopathy: Secondary | ICD-10-CM | POA: Diagnosis present

## 2019-08-01 DIAGNOSIS — G8929 Other chronic pain: Secondary | ICD-10-CM | POA: Diagnosis present

## 2019-08-01 DIAGNOSIS — E785 Hyperlipidemia, unspecified: Secondary | ICD-10-CM | POA: Diagnosis present

## 2019-08-01 DIAGNOSIS — Z79891 Long term (current) use of opiate analgesic: Secondary | ICD-10-CM

## 2019-08-01 DIAGNOSIS — I7300 Raynaud's syndrome without gangrene: Secondary | ICD-10-CM | POA: Diagnosis present

## 2019-08-01 DIAGNOSIS — Z20822 Contact with and (suspected) exposure to covid-19: Secondary | ICD-10-CM | POA: Diagnosis present

## 2019-08-01 DIAGNOSIS — Z818 Family history of other mental and behavioral disorders: Secondary | ICD-10-CM

## 2019-08-01 DIAGNOSIS — I10 Essential (primary) hypertension: Secondary | ICD-10-CM | POA: Diagnosis present

## 2019-08-01 DIAGNOSIS — M7730 Calcaneal spur, unspecified foot: Secondary | ICD-10-CM | POA: Diagnosis present

## 2019-08-01 DIAGNOSIS — Z833 Family history of diabetes mellitus: Secondary | ICD-10-CM | POA: Diagnosis not present

## 2019-08-01 DIAGNOSIS — X58XXXA Exposure to other specified factors, initial encounter: Secondary | ICD-10-CM | POA: Diagnosis present

## 2019-08-01 DIAGNOSIS — M170 Bilateral primary osteoarthritis of knee: Secondary | ICD-10-CM | POA: Diagnosis present

## 2019-08-01 HISTORY — PX: ACHILLES TENDON SURGERY: SHX542

## 2019-08-01 HISTORY — DX: Raynaud's syndrome without gangrene: I73.00

## 2019-08-01 LAB — GLUCOSE, CAPILLARY
Glucose-Capillary: 180 mg/dL — ABNORMAL HIGH (ref 70–99)
Glucose-Capillary: 78 mg/dL (ref 70–99)
Glucose-Capillary: 140 mg/dL — ABNORMAL HIGH (ref 70–99)
Glucose-Capillary: 83 mg/dL (ref 70–99)
Glucose-Capillary: 360 mg/dL — ABNORMAL HIGH (ref 70–99)
Glucose-Capillary: 268 mg/dL — ABNORMAL HIGH (ref 70–99)

## 2019-08-01 LAB — CBC
HCT: 41.6 % (ref 36.0–46.0)
Hemoglobin: 13.2 g/dL (ref 12.0–15.0)
MCH: 28.9 pg (ref 26.0–34.0)
MCHC: 31.7 g/dL (ref 30.0–36.0)
MCV: 91.2 fL (ref 80.0–100.0)
Platelets: 373 10*3/uL (ref 150–400)
RBC: 4.56 MIL/uL (ref 3.87–5.11)
RDW: 13.8 % (ref 11.5–15.5)
WBC: 8.5 10*3/uL (ref 4.0–10.5)
nRBC: 0 % (ref 0.0–0.2)

## 2019-08-01 LAB — COMPREHENSIVE METABOLIC PANEL
ALT: 16 U/L (ref 0–44)
AST: 23 U/L (ref 15–41)
Albumin: 2.6 g/dL — ABNORMAL LOW (ref 3.5–5.0)
Alkaline Phosphatase: 97 U/L (ref 38–126)
Anion gap: 9 (ref 5–15)
BUN: 10 mg/dL (ref 6–20)
CO2: 26 mmol/L (ref 22–32)
Calcium: 8.7 mg/dL — ABNORMAL LOW (ref 8.9–10.3)
Chloride: 102 mmol/L (ref 98–111)
Creatinine, Ser: 0.56 mg/dL (ref 0.44–1.00)
GFR calc Af Amer: >60 mL/min (ref >60)
GFR calc non Af Amer: >60 mL/min (ref >60)
Glucose, Bld: 173 mg/dL — ABNORMAL HIGH (ref 70–99)
Potassium: 3.9 mmol/L (ref 3.5–5.1)
Sodium: 137 mmol/L (ref 135–145)
Total Bilirubin: 0.4 mg/dL (ref 0.3–1.2)
Total Protein: 7.4 g/dL (ref 6.5–8.1)

## 2019-08-01 LAB — HEMOGLOBIN A1C
Hgb A1c MFr Bld: 9.2 % — ABNORMAL HIGH (ref 4.8–5.6)
Mean Plasma Glucose: 217.34 mg/dL

## 2019-08-01 SURGERY — REPAIR, TENDON, ACHILLES
Anesthesia: General | Site: Ankle | Laterality: Right

## 2019-08-01 MED ORDER — ONDANSETRON HCL 4 MG/2ML IJ SOLN: 4.0000 mg | Freq: Four times a day (QID) | INTRAMUSCULAR | Status: DC | PRN

## 2019-08-01 MED ORDER — CHLORHEXIDINE GLUCONATE 4 % EX LIQD: 60.0000 mL | Freq: Once | CUTANEOUS | Status: DC

## 2019-08-01 MED ORDER — PROMETHAZINE HCL 25 MG/ML IJ SOLN
6.2500 mg | INTRAMUSCULAR | Status: DC | PRN
  Administered 2019-08-01: 6.25 mg via INTRAVENOUS

## 2019-08-01 MED ORDER — OXYCODONE HCL 5 MG PO TABS: 5.0000 mg | ORAL_TABLET | ORAL | Status: DC | PRN

## 2019-08-01 MED ORDER — DEXAMETHASONE SODIUM PHOSPHATE 4 MG/ML IJ SOLN
INTRAMUSCULAR | Status: DC | PRN
  Administered 2019-08-01: 6 mg via INTRAVENOUS

## 2019-08-01 MED ORDER — PROMETHAZINE HCL 25 MG/ML IJ SOLN
INTRAMUSCULAR | Status: AC
  Filled 2019-08-01: qty 1

## 2019-08-01 MED ORDER — ONDANSETRON HCL 4 MG PO TABS: 4.0000 mg | ORAL_TABLET | Freq: Four times a day (QID) | ORAL | Status: DC | PRN

## 2019-08-01 MED ORDER — METHOCARBAMOL 1000 MG/10ML IJ SOLN
500.0000 mg | Freq: Four times a day (QID) | INTRAVENOUS | Status: DC | PRN
  Filled 2019-08-01: qty 5

## 2019-08-01 MED ORDER — CEFAZOLIN SODIUM-DEXTROSE 1-4 GM/50ML-% IV SOLN
1.0000 g | Freq: Three times a day (TID) | INTRAVENOUS | Status: AC
  Administered 2019-08-01 – 2019-08-02 (×3): 1 g via INTRAVENOUS
  Filled 2019-08-01 (×3): qty 50

## 2019-08-01 MED ORDER — DOCUSATE SODIUM 100 MG PO CAPS
100.0000 mg | ORAL_CAPSULE | Freq: Two times a day (BID) | ORAL | Status: DC
  Administered 2019-08-01 – 2019-08-02 (×2): 100 mg via ORAL
  Filled 2019-08-01 (×2): qty 1

## 2019-08-01 MED ORDER — LIDOCAINE 2% (20 MG/ML) 5 ML SYRINGE
INTRAMUSCULAR | Status: AC
  Filled 2019-08-01: qty 5

## 2019-08-01 MED ORDER — MEPERIDINE HCL 25 MG/ML IJ SOLN: 6.2500 mg | INTRAMUSCULAR | Status: DC | PRN

## 2019-08-01 MED ORDER — LACTATED RINGERS IV SOLN: INTRAVENOUS | Status: DC | PRN

## 2019-08-01 MED ORDER — SUGAMMADEX SODIUM 200 MG/2ML IV SOLN
INTRAVENOUS | Status: DC | PRN
  Administered 2019-08-01: 250 mg via INTRAVENOUS

## 2019-08-01 MED ORDER — POLYETHYLENE GLYCOL 3350 17 G PO PACK: 17.0000 g | PACK | Freq: Every day | ORAL | Status: DC | PRN

## 2019-08-01 MED ORDER — INSULIN LISPRO (1 UNIT DIAL) 100 UNIT/ML (KWIKPEN): 40.0000 [IU] | PEN_INJECTOR | Freq: Three times a day (TID) | SUBCUTANEOUS | Status: DC

## 2019-08-01 MED ORDER — METHOCARBAMOL 500 MG PO TABS: 500.0000 mg | ORAL_TABLET | Freq: Four times a day (QID) | ORAL | Status: DC | PRN

## 2019-08-01 MED ORDER — FENTANYL CITRATE (PF) 250 MCG/5ML IJ SOLN
INTRAMUSCULAR | Status: AC
  Filled 2019-08-01: qty 5

## 2019-08-01 MED ORDER — INSULIN ASPART 100 UNIT/ML ~~LOC~~ SOLN
40.0000 [IU] | Freq: Three times a day (TID) | SUBCUTANEOUS | Status: DC
  Administered 2019-08-01: 20 [IU] via SUBCUTANEOUS
  Administered 2019-08-01 – 2019-08-02 (×3): 40 [IU] via SUBCUTANEOUS

## 2019-08-01 MED ORDER — MIDAZOLAM HCL 2 MG/2ML IJ SOLN
INTRAMUSCULAR | Status: AC
  Filled 2019-08-01: qty 2

## 2019-08-01 MED ORDER — ROCURONIUM BROMIDE 10 MG/ML (PF) SYRINGE
PREFILLED_SYRINGE | INTRAVENOUS | Status: DC | PRN
  Administered 2019-08-01: 50 mg via INTRAVENOUS

## 2019-08-01 MED ORDER — ASPIRIN 325 MG PO TABS
325.0000 mg | ORAL_TABLET | Freq: Every day | ORAL | Status: DC
  Administered 2019-08-01 – 2019-08-02 (×2): 325 mg via ORAL
  Filled 2019-08-01 (×2): qty 1

## 2019-08-01 MED ORDER — DEXAMETHASONE SODIUM PHOSPHATE 10 MG/ML IJ SOLN
INTRAMUSCULAR | Status: DC | PRN
  Administered 2019-08-01: 10 mg via INTRAVENOUS

## 2019-08-01 MED ORDER — BUPIVACAINE HCL (PF) 0.5 % IJ SOLN
INTRAMUSCULAR | Status: DC | PRN
  Administered 2019-08-01: 30 mL via PERINEURAL

## 2019-08-01 MED ORDER — FENTANYL CITRATE (PF) 100 MCG/2ML IJ SOLN
INTRAMUSCULAR | Status: DC | PRN
  Administered 2019-08-01 (×3): 50 ug via INTRAVENOUS

## 2019-08-01 MED ORDER — LIDOCAINE 2% (20 MG/ML) 5 ML SYRINGE
INTRAMUSCULAR | Status: DC | PRN
  Administered 2019-08-01: 20 mg via INTRAVENOUS

## 2019-08-01 MED ORDER — INSULIN GLARGINE 100 UNIT/ML ~~LOC~~ SOLN
80.0000 [IU] | Freq: Every day | SUBCUTANEOUS | Status: DC
  Administered 2019-08-01: 80 [IU] via SUBCUTANEOUS
  Filled 2019-08-01 (×2): qty 0.8

## 2019-08-01 MED ORDER — MIDAZOLAM HCL 5 MG/5ML IJ SOLN
INTRAMUSCULAR | Status: DC | PRN
  Administered 2019-08-01: 2 mg via INTRAVENOUS

## 2019-08-01 MED ORDER — ACETAMINOPHEN 325 MG PO TABS: 325.0000 mg | ORAL_TABLET | Freq: Four times a day (QID) | ORAL | Status: DC | PRN

## 2019-08-01 MED ORDER — ONDANSETRON HCL 4 MG/2ML IJ SOLN
INTRAMUSCULAR | Status: DC | PRN
  Administered 2019-08-01: 4 mg via INTRAVENOUS

## 2019-08-01 MED ORDER — ALBUTEROL SULFATE HFA 108 (90 BASE) MCG/ACT IN AERS: 2.0000 | INHALATION_SPRAY | Freq: Four times a day (QID) | RESPIRATORY_TRACT | Status: DC | PRN

## 2019-08-01 MED ORDER — CLONIDINE HCL (ANALGESIA) 100 MCG/ML EP SOLN
EPIDURAL | Status: DC | PRN
  Administered 2019-08-01: 100 ug

## 2019-08-01 MED ORDER — METOCLOPRAMIDE HCL 5 MG/ML IJ SOLN: 5.0000 mg | Freq: Three times a day (TID) | INTRAMUSCULAR | Status: DC | PRN

## 2019-08-01 MED ORDER — PROPOFOL 10 MG/ML IV BOLUS
INTRAVENOUS | Status: DC | PRN
  Administered 2019-08-01: 200 mg via INTRAVENOUS

## 2019-08-01 MED ORDER — DEXAMETHASONE SODIUM PHOSPHATE 10 MG/ML IJ SOLN
INTRAMUSCULAR | Status: AC
  Filled 2019-08-01: qty 1

## 2019-08-01 MED ORDER — HYDROMORPHONE HCL 1 MG/ML IJ SOLN: 0.5000 mg | INTRAMUSCULAR | Status: DC | PRN

## 2019-08-01 MED ORDER — METOCLOPRAMIDE HCL 5 MG PO TABS: 5.0000 mg | ORAL_TABLET | Freq: Three times a day (TID) | ORAL | Status: DC | PRN

## 2019-08-01 MED ORDER — SODIUM CHLORIDE 0.9 % IV SOLN: INTRAVENOUS | Status: DC

## 2019-08-01 MED ORDER — DEXTROSE 5 % IV SOLN
3.0000 g | INTRAVENOUS | Status: AC
  Administered 2019-08-01: 08:00:00 3 g via INTRAVENOUS
  Filled 2019-08-01 (×2): qty 3000

## 2019-08-01 MED ORDER — ONDANSETRON HCL 4 MG/2ML IJ SOLN
INTRAMUSCULAR | Status: AC
  Filled 2019-08-01: qty 2

## 2019-08-01 MED ORDER — INSULIN ASPART 100 UNIT/ML ~~LOC~~ SOLN
0.0000 [IU] | Freq: Three times a day (TID) | SUBCUTANEOUS | Status: DC
  Administered 2019-08-01: 20 [IU] via SUBCUTANEOUS
  Administered 2019-08-01: 3 [IU] via SUBCUTANEOUS

## 2019-08-01 MED ORDER — PROPOFOL 10 MG/ML IV BOLUS
INTRAVENOUS | Status: AC
  Filled 2019-08-01: qty 20

## 2019-08-01 MED ORDER — ROCURONIUM BROMIDE 10 MG/ML (PF) SYRINGE
PREFILLED_SYRINGE | INTRAVENOUS | Status: AC
  Filled 2019-08-01: qty 10

## 2019-08-01 MED ORDER — 0.9 % SODIUM CHLORIDE (POUR BTL) OPTIME
TOPICAL | Status: DC | PRN
  Administered 2019-08-01: 1000 mL

## 2019-08-01 SURGICAL SUPPLY — 60 items
ANCHOR SUT BIOCOMP CORKSREW (Anchor) ×3 IMPLANT
BLADE SURG 10 STRL SS (BLADE) ×3 IMPLANT
BNDG ELASTIC 4X5.8 VLCR STR LF (GAUZE/BANDAGES/DRESSINGS) ×6 IMPLANT
BNDG ESMARK 4X9 LF (GAUZE/BANDAGES/DRESSINGS) ×3 IMPLANT
CLOSURE WOUND 1/2 X4 (GAUZE/BANDAGES/DRESSINGS)
COVER MAYO STAND STRL (DRAPES) ×3 IMPLANT
COVER WAND RF STERILE (DRAPES) IMPLANT
CUFF TOURN SGL QUICK 24 (TOURNIQUET CUFF) ×3
CUFF TOURN SGL QUICK 34 (TOURNIQUET CUFF)
CUFF TOURN SGL QUICK 42 (TOURNIQUET CUFF) IMPLANT
CUFF TRNQT CYL 24X4X16.5-23 (TOURNIQUET CUFF) ×1 IMPLANT
CUFF TRNQT CYL 34X4.125X (TOURNIQUET CUFF) IMPLANT
DRAPE INCISE IOBAN 66X45 STRL (DRAPES) ×3 IMPLANT
DRAPE U-SHAPE 47X51 STRL (DRAPES) ×3 IMPLANT
DRSG ADAPTIC 3X8 NADH LF (GAUZE/BANDAGES/DRESSINGS) IMPLANT
DURAPREP 26ML APPLICATOR (WOUND CARE) ×3 IMPLANT
ELECT REM PT RETURN 9FT ADLT (ELECTROSURGICAL) ×3
ELECTRODE REM PT RTRN 9FT ADLT (ELECTROSURGICAL) ×1 IMPLANT
GAUZE XEROFORM 5X9 LF (GAUZE/BANDAGES/DRESSINGS) ×3 IMPLANT
GLOVE BIOGEL PI IND STRL 8 (GLOVE) ×2 IMPLANT
GLOVE BIOGEL PI INDICATOR 8 (GLOVE) ×4
GLOVE ORTHO TXT STRL SZ7.5 (GLOVE) ×3 IMPLANT
GOWN STRL REUS W/ TWL LRG LVL3 (GOWN DISPOSABLE) IMPLANT
GOWN STRL REUS W/ TWL XL LVL3 (GOWN DISPOSABLE) ×1 IMPLANT
GOWN STRL REUS W/TWL 2XL LVL3 (GOWN DISPOSABLE) ×3 IMPLANT
GOWN STRL REUS W/TWL LRG LVL3 (GOWN DISPOSABLE)
GOWN STRL REUS W/TWL XL LVL3 (GOWN DISPOSABLE) ×3
KIT BIO-TENODESIS 3X8 DISP (MISCELLANEOUS) ×3
KIT INSRT BABSR STRL DISP BTN (MISCELLANEOUS) ×1 IMPLANT
KIT TURNOVER KIT B (KITS) ×3 IMPLANT
MANIFOLD NEPTUNE II (INSTRUMENTS) ×3 IMPLANT
NDL SUT 6 .5 CRC .975X.05 MAYO (NEEDLE) IMPLANT
NEEDLE MAYO TAPER (NEEDLE)
NS IRRIG 1000ML POUR BTL (IV SOLUTION) ×3 IMPLANT
PACK ORTHO EXTREMITY (CUSTOM PROCEDURE TRAY) IMPLANT
PAD ABD 8X10 STRL (GAUZE/BANDAGES/DRESSINGS) ×3 IMPLANT
PAD ARMBOARD 7.5X6 YLW CONV (MISCELLANEOUS) ×6 IMPLANT
PAD CAST 3X4 CTTN HI CHSV (CAST SUPPLIES) ×1 IMPLANT
PAD CAST 4YDX4 CTTN HI CHSV (CAST SUPPLIES) ×2 IMPLANT
PADDING CAST COTTON 3X4 STRL (CAST SUPPLIES) ×3
PADDING CAST COTTON 4X4 STRL (CAST SUPPLIES) ×6
PADDING CAST COTTON 6X4 STRL (CAST SUPPLIES) ×3 IMPLANT
SPLINT FIBERGLASS 3X12 (CAST SUPPLIES) ×3 IMPLANT
SPLINT FIBERGLASS 4X30 (CAST SUPPLIES) ×6 IMPLANT
STRIP CLOSURE SKIN 1/2X4 (GAUZE/BANDAGES/DRESSINGS) IMPLANT
SUCTION FRAZIER HANDLE 10FR (MISCELLANEOUS) ×3
SUCTION TUBE FRAZIER 10FR DISP (MISCELLANEOUS) ×1 IMPLANT
SUT ETHILON 2 0 FS 18 (SUTURE) ×9 IMPLANT
SUT FIBERWIRE #2 38 T-5 BLUE (SUTURE) ×3
SUT VIC AB 2-0 CT1 27 (SUTURE) ×3
SUT VIC AB 2-0 CT1 36 (SUTURE) ×3 IMPLANT
SUT VIC AB 2-0 CT1 TAPERPNT 27 (SUTURE) ×1 IMPLANT
SUT VIC AB 3-0 FS2 27 (SUTURE) ×3 IMPLANT
SUT VICRYL 0 TIES 12 18 (SUTURE) IMPLANT
SUTURE FIBERWR #2 38 T-5 BLUE (SUTURE) ×1 IMPLANT
TOWEL GREEN STERILE (TOWEL DISPOSABLE) ×3 IMPLANT
TOWEL GREEN STERILE FF (TOWEL DISPOSABLE) ×3 IMPLANT
TUBE CONNECTING 12'X1/4 (SUCTIONS) ×1
TUBE CONNECTING 12X1/4 (SUCTIONS) ×2 IMPLANT
WATER STERILE IRR 1000ML POUR (IV SOLUTION) ×3 IMPLANT

## 2019-08-01 NOTE — Op Note (Signed)
Preop diagnosis: Right Achilles tendon rupture, closed  Postop diagnosis: Same  Procedure: Repair right Achilles tendon rupture.  Surgeon: Rodell Perna, MD  Assistant Benjiman Core, PA-C medically necessary and present for the central portion of the case with tendon repair and closure.  Anesthesia: General orotracheal.  Tourniquet time: 250 Tourniquet time was approximately 45 minutes see anesthetic record.  Implants Arthrex 5.5 Bio-Corkscrew suture anchor with 1.3 mm suture tape.  Procedure: After induction general anesthesia patient placed prone on chest rolls careful padding positioning calf tourniquet was applied DuraPrep from the tourniquet to the tip of toes.  Usual extremity sheets drapes Timeout procedure was performed.  3 g Ancef was given due to patient's obesity and BMI greater than 120 kg.  Timeout procedure was completed.  Sterile skin marker was used and then leg was wrapped in Esmarch.  Incision was made along the lateral aspect of the Achilles tendon.  Sural nerve was identified carefully protected sheath was opened and Achilles tendon with primarily ruptured distally with the small area stretched out remaining tendon medial and slightly anterior.  Tendon rupture directly off the bone.  Drill was used to drill a K wire over reaming 4.5 mm drill followed by placement of a 5.5 Arthrex suture anchor with the 4 1.3 mm suture tape.  Suture tape was passed in the Bennell way through the tendon and advancing the tendon directly down to freshen up bone surface.  #2 FiberWire sutures were then used for rest suturing the tendon to the small remaining area that had not ruptured and distally to the portion that was not calcified.  Tip of the ruptured Achilles tendon had bone and had a calcified and was used to pass the suture tape directly through the bone to help with securement.  Tendon was pulled directly down and foot could be pushed on ankle plantar flex dorsiflex and the repair was tight and  held securely.  Additional #2 FiberWire sutures were used for mattress sutures the adjacent tendon.  2-0 Vicryl was used for closing the Achilles tendon sheath.  2-0 nylon sutures were used for multiple interrupted skin closure sutures.  Postop dressing and short leg splint with the ankle slightly plantarflexed was then performed patient tolerated procedure well tourniquet was deflated prior to closure operative field was dry and patient tolerated procedure well.  Patient will be nonweightbearing for least 6 weeks.

## 2019-08-01 NOTE — Progress Notes (Addendum)
Inpatient Diabetes Program Recommendations  AACE/ADA: New Consensus Statement on Inpatient Glycemic Control (2015)  Target Ranges:  Prepandial:   less than 140 mg/dL      Peak postprandial:   less than 180 mg/dL (1-2 hours)      Critically ill patients:  140 - 180 mg/dL   Lab Results  Component Value Date   GLUCAP 83 08/01/2019    Review of Glycemic Control Results for LOURAINE, HERBST (MRN [id number]) as of 08/01/2019 11:06  Ref. Range 08/01/2019 06:14 08/01/2019 09:44 08/01/2019 10:31  Glucose-Capillary Latest Ref Range: 70 - 99 mg/dL 180 (H) 78 83   Diabetes history: DM 2 Outpatient Diabetes medications: basaglar 80 units qhs, Humalog 40 units tid Current orders for Inpatient glycemic control:  Novolog 0-20 units tid Lantus 80 units qhs Novolog 40 units tid with meals  Inpatient Diabetes Program Recommendations:    Consult for noncompliant diabetic. Glucose trends below 180 mg/dl. With 2 doses of decadron glucose is 78 and 83 today. A1c pending. Will watch for A1c and speak with pt if appropriate when A1c results.  Received Basaglar 64 units last night. Lantus 80 units ordered for tonight and Novolog 40 units tid with meals.  Thanks,  Tama Headings RN, MSN, BC-ADM Inpatient Diabetes Coordinator Team Pager [phone] (8a-5p)

## 2019-08-01 NOTE — Transfer of Care (Signed)
Immediate Anesthesia Transfer of Care Note  Patient: Miranda Ross  Procedure(s) Performed: RIGHT REPAIR ACHILLES TENDON AVULSION (Right Ankle)  Patient Location: PACU  Anesthesia Type:General  Level of Consciousness: oriented, drowsy and patient cooperative  Airway & Oxygen Therapy: Patient Spontanous Breathing and Patient connected to nasal cannula oxygen  Post-op Assessment: Report given to RN and Post -op Vital signs reviewed and stable  Post vital signs: Reviewed  Last Vitals:  Vitals Value Taken Time  BP 142/98 08/01/19 0924  Temp    Pulse 91 08/01/19 0927  Resp 21 08/01/19 0927  SpO2 94 % 08/01/19 0927  Vitals shown include unvalidated device data.  Last Pain:  Vitals:   08/01/19 0612  TempSrc: Oral  PainSc: 5       Patients Stated Pain Goal: 3 ([phone])  Complications: No apparent anesthesia complications

## 2019-08-01 NOTE — Anesthesia Preprocedure Evaluation (Addendum)
Anesthesia Evaluation  Patient identified by MRN, date of birth, ID band Patient awake    Reviewed: Allergy & Precautions, NPO status , Patient's Chart, lab work & pertinent test results  History of Anesthesia Complications (+) PONV and history of anesthetic complications  Airway Mallampati: III  TM Distance: >3 FB Neck ROM: Limited  Mouth opening: Limited Mouth Opening  Dental  (+) Teeth Intact, Dental Advisory Given   Pulmonary asthma , sleep apnea ,    Pulmonary exam normal        Cardiovascular negative cardio ROS   Rhythm:Regular Rate:Tachycardia     Neuro/Psych negative neurological ROS     GI/Hepatic negative GI ROS, Neg liver ROS,   Endo/Other  diabetes, Type 2Morbid obesity  Renal/GU negative Renal ROS     Musculoskeletal  (+) Arthritis ,   Abdominal (+) + obese,   Peds  Hematology negative hematology ROS (+)   Anesthesia Other Findings Day of surgery medications reviewed with the patient.  Reproductive/Obstetrics                           Anesthesia Physical Anesthesia Plan  ASA: III  Anesthesia Plan: General   Post-op Pain Management: GA combined w/ Regional for post-op pain   Induction: Intravenous  PONV Risk Score and Plan: 4 or greater and Ondansetron, Dexamethasone, Midazolam, Treatment may vary due to age or medical condition and Scopolamine patch - Pre-op  Airway Management Planned: LMA and Oral ETT  Additional Equipment:   Intra-op Plan:   Post-operative Plan: Extubation in OR  Informed Consent: I have reviewed the patients History and Physical, chart, labs and discussed the procedure including the risks, benefits and alternatives for the proposed anesthesia with the patient or authorized representative who has indicated his/her understanding and acceptance.     Dental advisory given  Plan Discussed with: CRNA  Anesthesia Plan Comments:          Anesthesia Quick Evaluation

## 2019-08-01 NOTE — Interval H&P Note (Signed)
History and Physical Interval Note:  08/01/2019 7:25 AM  Miranda Ross  has presented today for surgery, with the diagnosis of right achilles tendon avulsion.  The various methods of treatment have been discussed with the patient and family. After consideration of risks, benefits and other options for treatment, the patient has consented to  Procedure(s): [place] (Right) as a surgical intervention.  The patient's history has been reviewed, patient examined, no change in status, stable for surgery.  I have reviewed the patient's chart and labs.  Questions were answered to the patient's satisfaction.     [name]

## 2019-08-01 NOTE — Anesthesia Procedure Notes (Signed)
Procedure Name: Intubation Date/Time: 08/01/2019 7:42 AM Performed by: Jenne Campus, CRNA Pre-anesthesia Checklist: Patient identified, Emergency Drugs available, Suction available and Patient being monitored Patient Re-evaluated:Patient Re-evaluated prior to induction Oxygen Delivery Method: Circle System Utilized Preoxygenation: Pre-oxygenation with 100% oxygen Induction Type: IV induction Ventilation: Mask ventilation without difficulty Laryngoscope Size: Glidescope and 3 Grade View: Grade I Tube type: Oral Tube size: 7.0 mm Number of attempts: 1 Airway Equipment and Method: Stylet Placement Confirmation: ETT inserted through vocal cords under direct vision,  positive ETCO2 and breath sounds checked- equal and bilateral Secured at: 21 cm Tube secured with: Tape Dental Injury: Teeth and Oropharynx as per pre-operative assessment  Difficulty Due To: Difficulty was anticipated, Difficult Airway- due to reduced neck mobility and Difficult Airway- due to limited oral opening

## 2019-08-01 NOTE — Evaluation (Signed)
Physical Therapy Evaluation Patient Details Name: Miranda Ross MRN: [id number] DOB: 1976/01/07 Today's Date: 08/01/2019   History of Present Illness  Pt is a 44 y/o female s/p R achilles repair. PMH includes DM, asthma, and back surgery.   Clinical Impression  Pt is s/p surgery above with deficits below. Pt requiring min guard A and use of RW to perform transfers this session. Pt able to maintain NWB on RLE throughout mobility. Pt reports having knee scooter at home, so will need to practice during next session. If pt unsuccessful with use of knee scooter, will likely require WC to perform mobility outside of household distances. Will continue to follow acutely to maximize functional mobility independence and safety.     Follow Up Recommendations Home health PT;Supervision for mobility/OOB    Equipment Recommendations  3in1 (PT);Wheelchair (measurements PT);Wheelchair cushion (measurements PT)(if unable to get WC )    Recommendations for Other Services       Precautions / Restrictions Precautions Precautions: Fall Restrictions Weight Bearing Restrictions: Yes RLE Weight Bearing: Non weight bearing      Mobility  Bed Mobility Overal bed mobility: Needs Assistance Bed Mobility: Supine to Sit     Supine to sit: Min assist     General bed mobility comments: Min A for RLE management.   Transfers Overall transfer level: Needs assistance Equipment used: Rolling walker (2 wheeled) Transfers: Sit to/from [facility] Sit to Stand: Min guard Stand pivot transfers: Min guard       General transfer comment: Min guard for steadying assist to stand and transfer to [facility] and then to recliner. Cues for sequencing using RW.   Ambulation/Gait                Stairs            Wheelchair Mobility    Modified Rankin (Stroke Patients Only)       Balance Overall balance assessment: Needs assistance Sitting-balance support: No upper extremity  supported;Feet supported Sitting balance-Leahy Scale: Good     Standing balance support: Bilateral upper extremity supported;During functional activity Standing balance-Leahy Scale: Poor Standing balance comment: Reliant on BUE support                              Pertinent Vitals/Pain Pain Assessment: No/denies pain    Home Living Family/patient expects to be discharged to:: Private residence Living Arrangements: Spouse/significant other;Children Available Help at Discharge: Family Type of Home: House Home Access: Ramped entrance     Home Layout: One level Home Equipment: Walker - standard;Other (comment)(knee scooter)      Prior Function Level of Independence: Independent               Hand Dominance        Extremity/Trunk Assessment   Upper Extremity Assessment Upper Extremity Assessment: Defer to OT evaluation    Lower Extremity Assessment Lower Extremity Assessment: RLE deficits/detail RLE Deficits / Details: Reports numbness throughout R ankle and foot. Deficits consistent with post op pain and weakness.        Communication   Communication: No difficulties  Cognition Arousal/Alertness: Awake/alert Behavior During Therapy: WFL for tasks assessed/performed Overall Cognitive Status: Within Functional Limits for tasks assessed  General Comments General comments (skin integrity, edema, etc.): Pt's husband present during session     Exercises     Assessment/Plan    PT Assessment Patient needs continued PT services  PT Problem List Decreased balance;Decreased mobility;Impaired sensation       PT Treatment Interventions Gait training;DME instruction;Functional mobility training;Therapeutic activities;Therapeutic exercise;Balance training;Patient/family education;Wheelchair mobility training    PT Goals (Current goals can be found in the Care Plan section)  Acute Rehab PT  Goals Patient Stated Goal: to go home PT Goal Formulation: With patient Time For Goal Achievement: 08/15/19 Potential to Achieve Goals: Good    Frequency Min 5X/week   Barriers to discharge        Co-evaluation               AM-PAC PT "6 Clicks" Mobility  Outcome Measure Help needed turning from your back to your side while in a flat bed without using bedrails?: A Little Help needed moving from lying on your back to sitting on the side of a flat bed without using bedrails?: A Little Help needed moving to and from a bed to a chair (including a wheelchair)?: A Little Help needed standing up from a chair using your arms (e.g., wheelchair or bedside chair)?: A Little Help needed to walk in hospital room?: A Little Help needed climbing 3-5 steps with a railing? : A Lot 6 Click Score: 17    End of Session Equipment Utilized During Treatment: Gait belt Activity Tolerance: Patient tolerated treatment well Patient left: in chair;with call bell/phone within reach Nurse Communication: Mobility status PT Visit Diagnosis: Other abnormalities of gait and mobility (R26.89);Difficulty in walking, not elsewhere classified (R26.2)    Time: [id number] PT Time Calculation (min) (ACUTE ONLY): 29 min   Charges:   PT Evaluation $PT Eval Low Complexity: 1 Low PT Treatments $Therapeutic Activity: 8-22 mins        Miranda Ross, DPT  Acute Rehabilitation Services  Pager: [phone] Office: [phone]   Miranda Ross 08/01/2019, 6:42 PM

## 2019-08-01 NOTE — Anesthesia Procedure Notes (Signed)
Anesthesia Regional Block: Popliteal block   Pre-Anesthetic Checklist: ,, timeout performed, Correct Patient, Correct Site, Correct Laterality, Correct Procedure, Correct Position, site marked, Risks and benefits discussed,  Surgical consent,  Pre-op evaluation,  At surgeon's request and post-op pain management  Laterality: Right  Prep: chloraprep       Needles:  Injection technique: Single-shot  Needle Type: Stimiplex     Needle Length: 10cm  Needle Gauge: 21     Additional Needles:   Procedures:,,,, ultrasound used (permanent image in chart),,,,  Motor weakness within 5 minutes.   Nerve Stimulator or Paresthesia:  Response: 0.5 mA,   Additional Responses:   Narrative:  Start time: 08/01/2019 7:10 AM End time: 08/01/2019 7:15 AM Injection made incrementally with aspirations every 5 mL.  Performed by: Personally  Anesthesiologist: Nolon Nations, MD  Additional Notes: Nerve located and needle positioned with direct ultrasound guidance. Good perineural spread. Patient tolerated well.

## 2019-08-02 ENCOUNTER — Telehealth: Payer: Self-pay | Admitting: Rheumatology

## 2019-08-02 ENCOUNTER — Encounter: Payer: Self-pay | Admitting: *Deleted

## 2019-08-02 LAB — BASIC METABOLIC PANEL
Anion gap: 10 (ref 5–15)
BUN: 13 mg/dL (ref 6–20)
CO2: 23 mmol/L (ref 22–32)
Calcium: 8.7 mg/dL — ABNORMAL LOW (ref 8.9–10.3)
Chloride: 104 mmol/L (ref 98–111)
Creatinine, Ser: 0.64 mg/dL (ref 0.44–1.00)
GFR calc Af Amer: >60 mL/min (ref >60)
GFR calc non Af Amer: >60 mL/min (ref >60)
Glucose, Bld: 201 mg/dL — ABNORMAL HIGH (ref 70–99)
Potassium: 4.4 mmol/L (ref 3.5–5.1)
Sodium: 137 mmol/L (ref 135–145)

## 2019-08-02 LAB — GLUCOSE, CAPILLARY
Glucose-Capillary: 184 mg/dL — ABNORMAL HIGH (ref 70–99)
Glucose-Capillary: 244 mg/dL — ABNORMAL HIGH (ref 70–99)

## 2019-08-02 LAB — CBC
HCT: 37.5 % (ref 36.0–46.0)
Hemoglobin: 12 g/dL (ref 12.0–15.0)
MCH: 28.8 pg (ref 26.0–34.0)
MCHC: 32 g/dL (ref 30.0–36.0)
MCV: 90.1 fL (ref 80.0–100.0)
Platelets: 342 10*3/uL (ref 150–400)
RBC: 4.16 MIL/uL (ref 3.87–5.11)
RDW: 13.8 % (ref 11.5–15.5)
WBC: 14.7 10*3/uL — ABNORMAL HIGH (ref 4.0–10.5)
nRBC: 0 % (ref 0.0–0.2)

## 2019-08-02 MED ORDER — HYDROCODONE-ACETAMINOPHEN 5-325 MG PO TABS: 1.0000 | ORAL_TABLET | Freq: Four times a day (QID) | ORAL | 0 refills | Status: DC | PRN

## 2019-08-02 MED ORDER — ASPIRIN 325 MG PO TABS: 325.0000 mg | ORAL_TABLET | Freq: Every day | ORAL | Status: DC

## 2019-08-02 NOTE — Plan of Care (Signed)
Pt and husband given D/C instructions with verbal understanding. Rx's were sent to Pt's pharmacy by MD. Pt's incision is clean and dry with no sign of infection. Pt's IV was removed prior to D/C. Pt received tube bench and wheelchair from [employer] per MD order. Pt D/C'd home via wheelchair per MD order. Pt is stable @ D/C and has no other needs at this time. Holli Humbles, RN

## 2019-08-02 NOTE — Telephone Encounter (Signed)
FYI: Patient called to reschedule her upcoming appointment to May due to an achilles tendon tear. Patient is in [facility] right now, and will be non- weight bearing for 6 weeks. Patient wanted to let you know due to her drug agreement, she will be going home with pain medication.

## 2019-08-02 NOTE — Progress Notes (Signed)
Physical Therapy Treatment Patient Details Name: Miranda Ross MRN: [id number] DOB: [birth year] Today's Date: 08/02/2019    History of Present Illness Pt is a 44 y/o female s/p R achilles repair. PMH includes DM, asthma, and back surgery.     PT Comments    Pt demonstrated great use of knee scooter today, no unsteadiness or LOB noted and is very safe with its use (locks before mounting, slow and safe speed). Pt also demonstrated proficiency with transfers with use of walker, with great maintenance of NWB RLE. Pt eager to d/c home with assist of husband today, no further questions and all education completed.    Follow Up Recommendations  Home health PT;Supervision for mobility/OOB     Equipment Recommendations  3in1 (PT);Wheelchair (measurements PT);Wheelchair cushion (measurements PT)(if unable to get WC )    Recommendations for Other Services       Precautions / Restrictions Precautions Precautions: Fall Required Braces or Orthoses: Other Brace Other Brace: ace wrapped Restrictions Weight Bearing Restrictions: Yes RLE Weight Bearing: Non weight bearing    Mobility  Bed Mobility Overal bed mobility: Needs Assistance Bed Mobility: Supine to Sit;Sit to Supine     Supine to sit: Supervision Sit to supine: Supervision   General bed mobility comments: HOB slightly elevated, no assist required   Transfers Overall transfer level: Needs assistance Equipment used: Rolling walker (2 wheeled) Transfers: Sit to/from Stand Sit to Stand: Supervision Stand pivot transfers: Supervision       General transfer comment: supervision for sit<>stand, with verbal cuing for hand placement when rising/sitting. Supervision for stand pivot to and from [facility], good maintenance of NWB during transfer.  Ambulation/Gait Ambulation/Gait assistance: Supervision Gait Distance (Feet): 150 Feet Assistive device: (knee scooter)   Gait velocity: normal with knee scooter   General Gait Details:  knee scooter utilized for hallway navigation, what pt has at home. Supervision for safety, no unsteadiness or LOB with use of knee scooter. Pt does require assist to place and remove knee secondary to spinal anesthesia.   Stairs             Wheelchair Mobility    Modified Rankin (Stroke Patients Only)       Balance Overall balance assessment: Needs assistance Sitting-balance support: No upper extremity supported;Feet supported Sitting balance-Leahy Scale: Good     Standing balance support: Bilateral upper extremity supported;During functional activity Standing balance-Leahy Scale: Poor Standing balance comment: Reliant on BUE support                             Cognition Arousal/Alertness: Awake/alert Behavior During Therapy: WFL for tasks assessed/performed Overall Cognitive Status: Within Functional Limits for tasks assessed                                        Exercises      General Comments General comments (skin integrity, edema, etc.): pt's husband present during PT, supportive and able to assist pt at home      Pertinent Vitals/Pain Pain Assessment: No/denies pain    Home Living Family/patient expects to be discharged to:: Private residence Living Arrangements: Spouse/significant other;Children Available Help at Discharge: Family Type of Home: House Home Access: Ramped entrance   Home Layout: One level Home Equipment: Walker - standard;Other (comment)(knee scooter)      Prior Function Level of Independence: Independent  PT Goals (current goals can now be found in the care plan section) Acute Rehab PT Goals Patient Stated Goal: to go home PT Goal Formulation: With patient Time For Goal Achievement: 08/15/19 Potential to Achieve Goals: Good Progress towards PT goals: Progressing toward goals    Frequency    Min 5X/week      PT Plan Current plan remains appropriate    Co-evaluation               AM-PAC PT "6 Clicks" Mobility   Outcome Measure  Help needed turning from your back to your side while in a flat bed without using bedrails?: None Help needed moving from lying on your back to sitting on the side of a flat bed without using bedrails?: None Help needed moving to and from a bed to a chair (including a wheelchair)?: A Little Help needed standing up from a chair using your arms (e.g., wheelchair or bedside chair)?: A Little Help needed to walk in hospital room?: A Little Help needed climbing 3-5 steps with a railing? : A Lot 6 Click Score: 19    End of Session Equipment Utilized During Treatment: Gait belt Activity Tolerance: Patient tolerated treatment well Patient left: with call bell/phone within reach;in bed;with family/visitor present Nurse Communication: Mobility status PT Visit Diagnosis: Other abnormalities of gait and mobility (R26.89);Difficulty in walking, not elsewhere classified (R26.2)     Time: [id number] PT Time Calculation (min) (ACUTE ONLY): 18 min  Charges:  $Therapeutic Activity: 8-22 mins                     Amil Moseman E, PT Acute Rehabilitation Services Pager [phone]  Office [phone]    Kecia Swoboda D Elonda Husky 08/02/2019, 12:22 PM

## 2019-08-02 NOTE — Discharge Instructions (Signed)
Do not put weight on your foot to protect the tendon repair. Keep splint dry. See Dr. Lorin Mercy in 2 wks.

## 2019-08-02 NOTE — Progress Notes (Signed)
Inpatient Diabetes Program Recommendations  AACE/ADA: New Consensus Statement on Inpatient Glycemic Control (2015)  Target Ranges:  Prepandial:   less than 140 mg/dL      Peak postprandial:   less than 180 mg/dL (1-2 hours)      Critically ill patients:  140 - 180 mg/dL   Lab Results  Component Value Date   GLUCAP 184 (H) 08/02/2019   HGBA1C 9.2 (H) 08/01/2019    Spoke to pt at bedside regarding A1c and glucose control at home. Pt sees Dr. Buddy Duty, Endocrinologist, for DM management and last saw him the beginning of February. Pt reports her A1c being 10.5% at that time. 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   Discussed current A1c of 9.2%. Pt has an appointment with Dr. Buddy Duty this Thursday. Discussed with pt she received decadron 16 mg yesterday. Spoke to pt about expecting elevations in her glucose trends for a couple of days and then concentrate on continuing to lower glucose trends. Pt is in discussions with Dr. Buddy Duty about possibly needing Concentrated Humulin R U-500 insulin. Pt does not prefer to be on that at this time.  Thanks,  Tama Headings RN, MSN, BC-ADM Inpatient Diabetes Coordinator Team Pager [phone] (8a-5p)

## 2019-08-02 NOTE — Progress Notes (Signed)
   Subjective: 1 Day Post-Op Procedure(s) (LRB): RIGHT REPAIR ACHILLES TENDON AVULSION (Right) Patient reports pain as 0 on 0-10 scale.    Objective: Vital signs in last 24 hours: Temp:  [97.8 F (36.6 C)-98.4 F (36.9 C)] 97.8 F (36.6 C) (03/23 0734) Pulse Rate:  [54-96] 54 (03/23 0734) Resp:  [14-20] 18 (03/23 0734) BP: (106-142)/(61-98) 106/61 (03/23 0734) SpO2:  [94 %-100 %] 100 % (03/23 0734) Weight:  [125.2 kg] 125.2 kg (03/22 1105)  Intake/Output from previous day: 03/22 0701 - 03/23 0700 In: [id number] [P.O.:600; I.V.:1040; IV Piggyback:50] Out: 153 [Urine:150; Blood:3] Intake/Output this shift: No intake/output data recorded.  Recent Labs    08/01/19 0642 08/02/19 0533  HGB 13.2 12.0   Recent Labs    08/01/19 0642 08/02/19 0533  WBC 8.5 14.7*  RBC 4.56 4.16  HCT 41.6 37.5  PLT 373 342   Recent Labs    08/01/19 0642 08/02/19 0533  NA 137 137  K 3.9 4.4  CL 102 104  CO2 26 23  BUN 10 13  CREATININE 0.56 0.64  GLUCOSE 173* 201*  CALCIUM 8.7* 8.7*   No results for input(s): LABPT, INR in the last 72 hours.  mobile to bathroom splint intact No results found.  Assessment/Plan: 1 Day Post-Op Procedure(s) (LRB): RIGHT REPAIR ACHILLES TENDON AVULSION (Right) Plan: discharge home after therapy. Office 2 wks [name] 08/02/2019, 7:50 AM

## 2019-08-02 NOTE — Telephone Encounter (Signed)
Thank you for informing me.

## 2019-08-02 NOTE — Evaluation (Addendum)
Occupational Therapy Evaluation Patient Details Name: Miranda Ross MRN: [id number] DOB: 1976/01/16 Today's Date: 08/02/2019    History of Present Illness Pt is a 44 y/o female s/p R achilles repair. PMH includes DM, asthma, and back surgery.    Clinical Impression   This 44 y/o female presents with the above. PTA pt independent with ADL and functional mobility. Pt presents supine in bed very pleasant and willing to participate in therapy session. Pt able to perform short distance mobility in room using RW at [facility] assist level with good maintenance of NWB status. She currently requires minA for LB ADL. Discussed and further educated pt re: DME, safety and compensatory techniques for enhancing safety and independence with ADL and functional mobility tasks with pt verbalizing understanding throughout. Pt reports plans to return home with good family assist for ADL/mobility tasks PRN. Will continue to follow while she remains in acute setting to maximize her safety and independence with ADL/mobility prior to discharge.     Follow Up Recommendations  No OT follow up;Supervision/Assistance - 24 hour(24hr initially)    Equipment Recommendations  Tub/shower bench;Wheelchair (measurements OT);Wheelchair cushion (measurements OT)           Precautions / Restrictions Precautions Precautions: Fall Restrictions Weight Bearing Restrictions: Yes RLE Weight Bearing: Non weight bearing      Mobility Bed Mobility Overal bed mobility: Needs Assistance Bed Mobility: Supine to Sit;Sit to Supine     Supine to sit: Supervision Sit to supine: Supervision   General bed mobility comments: HOB slightly elevated, no assist required   Transfers Overall transfer level: Needs assistance Equipment used: Rolling walker (2 wheeled) Transfers: Sit to/from Stand Sit to Stand: Min guard         General transfer comment: minguard for safety and balance, pt tending to pull up on RW     Balance  Overall balance assessment: Needs assistance Sitting-balance support: No upper extremity supported;Feet supported Sitting balance-Leahy Scale: Good     Standing balance support: Bilateral upper extremity supported;During functional activity Standing balance-Leahy Scale: Poor Standing balance comment: Reliant on BUE support                            ADL either performed or assessed with clinical judgement   ADL Overall ADL's : Needs assistance/impaired Eating/Feeding: Modified independent;Sitting   Grooming: Set up;Sitting   Upper Body Bathing: Sitting;Modified independent   Lower Body Bathing: Min guard;Sitting/lateral leans;Sit to/from stand   Upper Body Dressing : Modified independent;Sitting   Lower Body Dressing: Minimal assistance;Sitting/lateral leans;Sit to/from stand Lower Body Dressing Details (indicate cue type and reason): pt reports needing some assist do don shorts over RLE, able to don L sock without difficulty. reports able to advance shorts over hips without assist  Toilet Transfer: Min guard;Ambulation;RW Toilet Transfer Details (indicate cue type and reason): simulated via transfer to/from EOB Toileting- Clothing Manipulation and Hygiene: Minimal assistance;Sitting/lateral lean;Sit to/from Nurse, children's Details (indicate cue type and reason): discussed safe transfer options and appropriate DME including use of tub bench for task completion  Functional mobility during ADLs: Min guard;Rolling walker                           Pertinent Vitals/Pain Pain Assessment: No/denies pain(still feeling nerve block)     Hand Dominance     Extremity/Trunk Assessment Upper Extremity Assessment Upper Extremity Assessment: Overall WFL for tasks  assessed   Lower Extremity Assessment Lower Extremity Assessment: Defer to PT evaluation       Communication Communication Communication: No difficulties   Cognition Arousal/Alertness:  Awake/alert Behavior During Therapy: WFL for tasks assessed/performed Overall Cognitive Status: Within Functional Limits for tasks assessed                                     General Comments       Exercises     Shoulder Instructions      Home Living Family/patient expects to be discharged to:: Private residence Living Arrangements: Spouse/significant other;Children Available Help at Discharge: Family Type of Home: House Home Access: Ramped entrance     Home Layout: One level     Bathroom Shower/Tub: Teacher, early years/pre: Standard     Home Equipment: Environmental consultant - standard;Other (comment)(knee scooter)          Prior Functioning/Environment Level of Independence: Independent                 OT Problem List: Decreased range of motion;Impaired balance (sitting and/or standing);Decreased knowledge of use of DME or AE;Decreased safety awareness;Obesity      OT Treatment/Interventions: Self-care/ADL training;Therapeutic exercise;Energy conservation;DME and/or AE instruction;Therapeutic activities;Patient/family education;Balance training;Visual/perceptual remediation/compensation    OT Goals(Current goals can be found in the care plan section) Acute Rehab OT Goals Patient Stated Goal: to go home OT Goal Formulation: With patient Time For Goal Achievement: 08/16/19 Potential to Achieve Goals: Good  OT Frequency: Min 2X/week   Barriers to D/C:            Co-evaluation              AM-PAC OT "6 Clicks" Daily Activity     Outcome Measure Help from another person eating meals?: None Help from another person taking care of personal grooming?: A Little Help from another person toileting, which includes using toliet, bedpan, or urinal?: A Little Help from another person bathing (including washing, rinsing, drying)?: A Little Help from another person to put on and taking off regular upper body clothing?: None Help from another  person to put on and taking off regular lower body clothing?: A Little 6 Click Score: 20   End of Session Equipment Utilized During Treatment: Rolling walker Nurse Communication: Mobility status  Activity Tolerance: Patient tolerated treatment well Patient left: in bed;with call bell/phone within reach  OT Visit Diagnosis: Other abnormalities of gait and mobility (R26.89)                [id number] OT Time Calculation (min): 19 min Charges:  OT General Charges $OT Visit: 1 Visit OT Evaluation $OT Eval Low Complexity: 1 Low  Lou Cal, OT Acute Rehabilitation Services Pager [phone] Office [phone]  Raymondo Band 08/02/2019, 9:36 AM

## 2019-08-03 NOTE — Anesthesia Postprocedure Evaluation (Signed)
Anesthesia Post Note  Patient: Miranda Ross  Procedure(s) Performed: RIGHT REPAIR ACHILLES TENDON AVULSION (Right Ankle)     Patient location during evaluation: PACU Anesthesia Type: General Level of consciousness: sedated and patient cooperative Pain management: pain level controlled Vital Signs Assessment: post-procedure vital signs reviewed and stable Respiratory status: spontaneous breathing Cardiovascular status: stable Anesthetic complications: no    Last Vitals:  Vitals:   08/02/19 0734 08/02/19 1136  BP: 106/61 120/62  Pulse: (!) 54 77  Resp: 18 18  Temp: 36.6 C 36.6 C  SpO2: 100% 100%    Last Pain:  Vitals:   08/02/19 0830  TempSrc:   PainSc: 0-No pain                 Nolon Nations

## 2019-08-04 ENCOUNTER — Ambulatory Visit: Payer: 59 | Admitting: Physician Assistant

## 2019-08-05 NOTE — Discharge Summary (Signed)
Patient ID: Miranda Ross MRN: [id number] DOB/AGE: 09/13/1975 44 y.o.  Admit date: 08/01/2019 Discharge date: 08/05/2019  Admission Diagnoses:  Active Problems:   Achilles rupture, right   Rupture of right Achilles tendon   Discharge Diagnoses:  Active Problems:   Achilles rupture, right   Rupture of right Achilles tendon  status post Procedure(s): RIGHT REPAIR ACHILLES TENDON AVULSION  Past Medical History:  Diagnosis Date  . Arthritis   . Asthma    uses albuterol inh prn  . Diabetes mellitus without complication (HCC)    NIDDM  . PONV (postoperative nausea and vomiting)   . Raynaud's disease   . Sleep apnea 2013   sleep study 2013 at [place], didnt finish study, left early    Surgeries: Procedure(s): RIGHT REPAIR ACHILLES TENDON AVULSION on 08/01/2019   Consultants:   Discharged Condition: Improved  Hospital Course: Miranda Ross is an 44 y.o. female who was admitted 08/01/2019 for operative treatment of achilles tendon rupture. Patient failed conservative treatments (please see the history and physical for the specifics) and had severe unremitting pain that affects sleep, daily activities and work/hobbies. After pre-op clearance, the patient was taken to the operating room on 08/01/2019 and underwent  Procedure(s): [place].    Patient was given perioperative antibiotics:  Anti-infectives (From admission, onward)   Start     Dose/Rate Route Frequency Ordered Stop   08/01/19 1600  ceFAZolin (ANCEF) IVPB 1 g/50 mL premix     1 g 100 mL/hr over 30 Minutes Intravenous Every 8 hours 08/01/19 1059 08/02/19 0747   08/01/19 0600  ceFAZolin (ANCEF) 3 g in dextrose 5 % 50 mL IVPB     3 g 100 mL/hr over 30 Minutes Intravenous On call to O.R. 08/01/19 0555 08/01/19 1135       Patient was given sequential compression devices and early ambulation to prevent DVT.   Patient benefited maximally from hospital stay and there were no complications.  At the time of discharge, the patient was urinating/moving their bowels without difficulty, tolerating a regular diet, pain is controlled with oral pain medications and they have been cleared by PT/OT.   Recent vital signs: No data found.   Recent laboratory studies: No results for input(s): WBC, HGB, HCT, PLT, NA, K, CL, CO2, BUN, CREATININE, GLUCOSE, INR, CALCIUM in the last 72 hours.  Invalid input(s): PT, 2   Discharge Medications:   Allergies as of 08/02/2019   No Known Allergies     Medication List    STOP taking these medications   traMADol 50 MG tablet Commonly known as: ULTRAM     TAKE these medications   aspirin 325 MG tablet Take 1 tablet (325 mg total) by mouth daily. Take one daily for 4 wks   Basaglar KwikPen 100 UNIT/ML Inject 80 Units into the skin at bedtime.   diclofenac sodium 1 % Gel Commonly known as: VOLTAREN Apply three grams to three large joints up to three times daily   HumaLOG KwikPen 100 UNIT/ML KwikPen Generic drug: insulin lispro Inject 40 Units into the skin 3 (three) times daily.   HYDROcodone-acetaminophen 5-325 MG tablet Commonly known as: Norco Take 1-2 tablets by mouth every 6 (six) hours as needed for moderate pain. Post op pain   levonorgestrel 20 MCG/24HR IUD Commonly known as: MIRENA 1 each by Intrauterine route once.   Proventil HFA 108 (90 Base) MCG/ACT inhaler Generic drug: albuterol Inhale 2 puffs into the lungs every 6 (six) hours  as needed for shortness of breath. For shortness of breath       Diagnostic Studies: MR Ankle Right w/o contrast  Result Date: 07/27/2019 CLINICAL DATA:  Achilles tendon tear EXAM: MRI OF THE RIGHT ANKLE WITHOUT CONTRAST TECHNIQUE: Multiplanar, multisequence MR imaging of the ankle was performed. No intravenous contrast was administered. COMPARISON:  None available FINDINGS: TENDONS Peroneal: Intact peroneus longus and peroneus brevis tendons. Posteromedial: Intact tibialis posterior, flexor  hallucis longus and flexor digitorum longus tendons. Anterior: Intact tibialis anterior, extensor hallucis longus and extensor digitorum longus tendons. Achilles: Full-thickness near complete tear of the Achilles tendon with bony avulsion of a portion of the posterior calcaneal enthesophyte which remains attached to the retracted tendon (series 5, images 11-12). Tendon is retracted 3.5 cm with fluid gap. A small portion of the medial most tendon fibers remain intact inserting onto the posterior calcaneus (series 6, image 13; series 2, images 23-30). The torn and retracted portion of the distal tendon is thickened and intermediate in signal suggesting underlying tendinosis. Plantar Fascia: Intact. Prominent enthesophyte at the calcaneal attachment without edema. LIGAMENTS Lateral: Intact. Medial: Intact. CARTILAGE Ankle Joint: No joint effusion or chondral defect. Subtalar Joints/Sinus Tarsi: No joint effusion or chondral defect. Bones: Large bidirectional calcaneal enthesophytes with fractured Achilles enthesophyte, as described above. Large os trigonum. Mild degenerative changes within the midfoot. No malalignment. Other: Soft tissue swelling at the posterior aspect of the lower leg/ankle. IMPRESSION: Full-thickness near-complete tear of the Achilles tendon with 3.5 cm retraction. A portion of the posterior calcaneal enthesophyte is fractured and retracted with the distal tendon. A small portion of the medial tendon fibers remain intact inserting onto the posterior calcaneus. These results will be called to the ordering clinician or representative by the Radiologist Assistant, and communication documented in the PACS or [employer]. Electronically Signed   By: Davina Poke D.O.   On: 07/27/2019 15:08   XR Ankle Complete Right  Result Date: 07/21/2019 Three-view x-rays right ankle obtained and reviewed.  This shows Achilles calcific tendinopathy located 3 cm from the insertion site of the Achilles  tendon.  Patient has Haglund's deformity. Impression: Calcific tendinopathy.  Suspicious for distal Achilles tendon rupture.     Follow-up Information    [name], MD Follow up in 2 week(s).   Specialty: Orthopedic Surgery Contact information: [address] [place] [address] [phone]           Discharge Plan:  discharge to home  Disposition:     Signed: Benjiman Core  08/05/2019, 3:25 PM

## 2019-08-08 ENCOUNTER — Telehealth: Payer: Self-pay | Admitting: Radiology

## 2019-08-08 NOTE — Telephone Encounter (Signed)
Patient was getting up to go to the bathroom and put knee on knee scooter. Her leg slipped off and the foot hit the floor, mostly at the 4th and 5th toes. The toes were more bent under than back. She felt a "pop" that was deep in her ankle. She was due for her pain medication, so she took it and laid down. She has some burning but denies any swelling. The splint is still intact. She does still have some numbness and tingling after the block wore off. The pain was severe at first, but is better now that she has taken medication and elevated the leg. She is due to come back into the office on 08/18/2019.  Would you like for me to bring patient in sooner for appt or wait to see if she is having problems?

## 2019-08-08 NOTE — Telephone Encounter (Signed)
Patient fell this morning and hit leg where surgery was done. Requests return call.

## 2019-08-08 NOTE — Telephone Encounter (Signed)
I called . Seems to be doing  better can plantarflex a little in splint seems OK. ROV as scheduled.

## 2019-08-11 ENCOUNTER — Other Ambulatory Visit: Payer: Self-pay | Admitting: Orthopedic Surgery

## 2019-08-11 ENCOUNTER — Telehealth: Payer: Self-pay | Admitting: Radiology

## 2019-08-11 MED ORDER — HYDROCODONE-ACETAMINOPHEN 5-325 MG PO TABS: 1.0000 | ORAL_TABLET | Freq: Four times a day (QID) | ORAL | 0 refills | Status: DC | PRN

## 2019-08-11 NOTE — Telephone Encounter (Signed)
Patient requests refill on pain medication. She uses [facility] in [place].  Could you please advise since Dr. Lorin Mercy is out of the office? Patient had right achilles tendon repair on 08/01/2019. She was prescribed hydrocodone 5/325 1-2 po q 6 hours prn #40 on 08/02/2019.

## 2019-08-11 NOTE — Telephone Encounter (Signed)
I called patient advised.

## 2019-08-11 NOTE — Telephone Encounter (Signed)
Rx sent 

## 2019-08-18 ENCOUNTER — Encounter: Payer: Self-pay | Admitting: Orthopaedic Surgery

## 2019-08-18 ENCOUNTER — Other Ambulatory Visit: Payer: Self-pay

## 2019-08-18 ENCOUNTER — Ambulatory Visit (INDEPENDENT_AMBULATORY_CARE_PROVIDER_SITE_OTHER): Payer: 59 | Admitting: Orthopaedic Surgery

## 2019-08-18 VITALS — Ht 63.0 in | Wt 276.0 lb

## 2019-08-18 DIAGNOSIS — S86011D Strain of right Achilles tendon, subsequent encounter: Secondary | ICD-10-CM

## 2019-08-18 NOTE — Progress Notes (Signed)
Post-Op Visit Note   Patient: Miranda Ross           Date of Birth: 10-Jun-1975           MRN: [id number] Visit Date: 08/18/2019 PCP: Everardo Beals, NP   Assessment & Plan: Postop right Achilles tendon repair.  Sutures removed Steri-Strips applied with tincture benzoin.  Soft dressing cam boot.  She can remove the boot to wash her leg.  She can get in the shower if she has a shower chair to sit on.  Recheck 5 weeks.  Tendon repair is intact.  Chief Complaint:  Chief Complaint  Patient presents with  . Right Ankle - Routine Post Op    08/01/2019 Right Repair Achilles Tendon Avulsion   Visit Diagnoses:  1. Rupture of right Achilles tendon, subsequent encounter     Plan: Cam boot return 5 weeks.  Exam at that time and then we can consider starting some weightbearing gradually.  Follow-Up Instructions: Return in about 5 weeks (around 09/22/2019).   Orders:  No orders of the defined types were placed in this encounter.  No orders of the defined types were placed in this encounter.   Imaging: No results found.  PMFS History: Patient Active Problem List   Diagnosis Date Noted  . Rupture of right Achilles tendon 08/01/2019  . Achilles rupture, right 07/21/2019  . Class 3 obesity with body mass index (BMI) of 50.0 to 59.9 in adult 01/29/2017  . Polyclonal gammopathy determined by serum protein electrophoresis 08/21/2016  . MGUS (monoclonal gammopathy of unknown significance) 08/18/2016  . History of asthma 07/28/2016  . Raynaud's disease without gangrene 07/24/2016  . Primary osteoarthritis of both knees 07/24/2016  . DJD (degenerative joint disease), cervical 07/24/2016  . History of gastroesophageal reflux (GERD) 07/24/2016  . ANA positive 07/24/2016  . Trochanteric bursitis of both hips 07/24/2016  . Hyperlipidemia 06/18/2013  . Hematuria 06/18/2013   Past Medical History:  Diagnosis Date  . Arthritis   . Asthma    uses albuterol inh prn  . Diabetes mellitus  without complication (HCC)    NIDDM  . PONV (postoperative nausea and vomiting)   . Raynaud's disease   . Sleep apnea 2013   sleep study 2013 at [place], didnt finish study, left early    Family History  Problem Relation Age of Onset  . Diabetes Mother   . Heart disease Mother   . Depression Mother   . Diverticulosis Mother   . Non-Hodgkin's lymphoma Father   . Scleroderma Sister     Past Surgical History:  Procedure Laterality Date  . ACHILLES TENDON SURGERY Right 08/01/2019   Procedure: RIGHT REPAIR ACHILLES TENDON AVULSION;  Surgeon: [name], MD;  Location: [place];  Service: Orthopedics;  Laterality: Right;  . BACK SURGERY     cervical neck fusion 2002  . FOOT SURGERY Left    topaz procedure  . MASS EXCISION Left 09/02/2012   Procedure: MINOR EXCISION OF CYST LEFT RING FINGER A-3 PULLEY;  Surgeon: Cammie Sickle., MD;  Location: [facility];  Service: Orthopedics;  Laterality: Left;  . neck fusion  2002  . OOPHORECTOMY    . right hand surgery  2003  . WISDOM TOOTH EXTRACTION    . WRIST SURGERY Right    TFCC repair 2003   Social History   Occupational History  . Not on file  Tobacco Use  . Smoking status: Never Smoker  . Smokeless tobacco: Never Used  Substance and Sexual Activity  . Alcohol use: No  . Drug use: No  . Sexual activity: Yes    Birth control/protection: I.U.D.    Comment: Mirena IUD placed 2016

## 2019-08-25 ENCOUNTER — Telehealth: Payer: Self-pay | Admitting: Radiology

## 2019-08-25 ENCOUNTER — Other Ambulatory Visit: Payer: Self-pay | Admitting: Orthopaedic Surgery

## 2019-08-25 MED ORDER — HYDROCODONE-ACETAMINOPHEN 5-325 MG PO TABS: 1.0000 | ORAL_TABLET | Freq: Four times a day (QID) | ORAL | 0 refills | Status: DC | PRN

## 2019-08-25 NOTE — Telephone Encounter (Signed)
I left voicemail for patient advising. 

## 2019-08-25 NOTE — Telephone Encounter (Signed)
Patient requests refill of pain medication. Given hydrocodone-acetaminophen 5/325 08/11/2018- 1-2 po q 6 prn pain #20. Status post Right Achilles Repair 08/01/2019.  Please send to [facility] in [place].

## 2019-08-25 NOTE — Telephone Encounter (Signed)
[facility]. OK done thanks 

## 2019-09-01 ENCOUNTER — Encounter: Payer: Self-pay | Admitting: Orthopaedic Surgery

## 2019-09-08 ENCOUNTER — Ambulatory Visit (INDEPENDENT_AMBULATORY_CARE_PROVIDER_SITE_OTHER): Payer: 59 | Admitting: Orthopaedic Surgery

## 2019-09-08 ENCOUNTER — Encounter: Payer: Self-pay | Admitting: Orthopaedic Surgery

## 2019-09-08 ENCOUNTER — Other Ambulatory Visit: Payer: Self-pay

## 2019-09-08 VITALS — Ht 63.0 in | Wt 276.0 lb

## 2019-09-08 DIAGNOSIS — S86011D Strain of right Achilles tendon, subsequent encounter: Secondary | ICD-10-CM

## 2019-09-08 MED ORDER — TRAMADOL HCL 50 MG PO TABS: 50.0000 mg | ORAL_TABLET | Freq: Four times a day (QID) | ORAL | 0 refills | Status: DC | PRN

## 2019-09-08 NOTE — Progress Notes (Signed)
   Post-Op Visit Note   Patient: Miranda Ross           Date of Birth: [birth year]           MRN: [id number] Visit Date: 09/08/2019 PCP: Everardo Beals, NP   Assessment & Plan:post op Achilles tendon repair. Blister is gone. Skin is dry, she will apply lotion. Still NWB. Recheck 2 wks as planned.   Chief Complaint:  Chief Complaint  Patient presents with  . Right Ankle - Follow-up    08/01/2019 Right Achilles Repair   Visit Diagnoses:  1. Rupture of right Achilles tendon, subsequent encounter     Plan: NWB,  OOW, ROV 2 wks.   Follow-Up Instructions: Return in about 2 weeks (around 09/22/2019).   Orders:  No orders of the defined types were placed in this encounter.  No orders of the defined types were placed in this encounter.   Imaging: No results found.  PMFS History: Patient Active Problem List   Diagnosis Date Noted  . Rupture of right Achilles tendon 08/01/2019  . Achilles rupture, right 07/21/2019  . Class 3 obesity with body mass index (BMI) of 50.0 to 59.9 in adult 01/29/2017  . Polyclonal gammopathy determined by serum protein electrophoresis 08/21/2016  . MGUS (monoclonal gammopathy of unknown significance) 08/18/2016  . History of asthma 07/28/2016  . Raynaud's disease without gangrene 07/24/2016  . Primary osteoarthritis of both knees 07/24/2016  . DJD (degenerative joint disease), cervical 07/24/2016  . History of gastroesophageal reflux (GERD) 07/24/2016  . ANA positive 07/24/2016  . Trochanteric bursitis of both hips 07/24/2016  . Hyperlipidemia 06/18/2013  . Hematuria 06/18/2013   Past Medical History:  Diagnosis Date  . Arthritis   . Asthma    uses albuterol inh prn  . Diabetes mellitus without complication (HCC)    NIDDM  . PONV (postoperative nausea and vomiting)   . Raynaud's disease   . Sleep apnea 2013   sleep study 2013 at [place], didnt finish study, left early    Family History  Problem Relation Age of Onset  . Diabetes Mother    . Heart disease Mother   . Depression Mother   . Diverticulosis Mother   . Non-Hodgkin's lymphoma Father   . Scleroderma Sister     Past Surgical History:  Procedure Laterality Date  . ACHILLES TENDON SURGERY Right 08/01/2019   Procedure: RIGHT REPAIR ACHILLES TENDON AVULSION;  Surgeon: [name], MD;  Location: [facility];  Service: Orthopedics;  Laterality: Right;  . BACK SURGERY     cervical neck fusion 2002  . FOOT SURGERY Left    topaz procedure  . MASS EXCISION Left 09/02/2012   Procedure: MINOR EXCISION OF CYST LEFT RING FINGER A-3 PULLEY;  Surgeon: Cammie Sickle., MD;  Location: [facility];  Service: Orthopedics;  Laterality: Left;  . neck fusion  2002  . OOPHORECTOMY    . right hand surgery  2003  . WISDOM TOOTH EXTRACTION    . WRIST SURGERY Right    TFCC repair 2003   Social History   Occupational History  . Not on file  Tobacco Use  . Smoking status: Never Smoker  . Smokeless tobacco: Never Used  Substance and Sexual Activity  . Alcohol use: No  . Drug use: No  . Sexual activity: Yes    Birth control/protection: I.U.D.    Comment: Mirena IUD placed 2016

## 2019-09-20 NOTE — Progress Notes (Deleted)
Office Visit Note  Patient: Miranda Ross             Date of Birth: [birth year]           MRN: [id number]             PCP: Everardo Beals, NP Referring: Delrae Rend, MD Visit Date: 09/27/2019 Occupation: @GUAROCC @  Subjective:  No chief complaint on file.   History of Present Illness: Miranda Ross is a 44 y.o. female ***   Activities of Daily Living:  Patient reports morning stiffness for *** {minute/hour:19697}.   Patient {ACTIONS;DENIES/REPORTS:21021675::"Denies"} nocturnal pain.  Difficulty dressing/grooming: {ACTIONS;DENIES/REPORTS:21021675::"Denies"} Difficulty climbing stairs: {ACTIONS;DENIES/REPORTS:21021675::"Denies"} Difficulty getting out of chair: {ACTIONS;DENIES/REPORTS:21021675::"Denies"} Difficulty using hands for taps, buttons, cutlery, and/or writing: {ACTIONS;DENIES/REPORTS:21021675::"Denies"}  No Rheumatology ROS completed.   PMFS History:  Patient Active Problem List   Diagnosis Date Noted  . Rupture of right Achilles tendon 08/01/2019  . Achilles rupture, right 07/21/2019  . Class 3 obesity with body mass index (BMI) of 50.0 to 59.9 in adult 01/29/2017  . Polyclonal gammopathy determined by serum protein electrophoresis 08/21/2016  . MGUS (monoclonal gammopathy of unknown significance) 08/18/2016  . History of asthma 07/28/2016  . Raynaud's disease without gangrene 07/24/2016  . Primary osteoarthritis of both knees 07/24/2016  . DJD (degenerative joint disease), cervical 07/24/2016  . History of gastroesophageal reflux (GERD) 07/24/2016  . ANA positive 07/24/2016  . Trochanteric bursitis of both hips 07/24/2016  . Hyperlipidemia 06/18/2013  . Hematuria 06/18/2013    Past Medical History:  Diagnosis Date  . Arthritis   . Asthma    uses albuterol inh prn  . Diabetes mellitus without complication (HCC)    NIDDM  . PONV (postoperative nausea and vomiting)   . Raynaud's disease   . Sleep apnea 2013   sleep study 2013 at [place], didnt  finish study, left early    Family History  Problem Relation Age of Onset  . Diabetes Mother   . Heart disease Mother   . Depression Mother   . Diverticulosis Mother   . Non-Hodgkin's lymphoma Father   . Scleroderma Sister    Past Surgical History:  Procedure Laterality Date  . ACHILLES TENDON SURGERY Right 08/01/2019   Procedure: RIGHT REPAIR ACHILLES TENDON AVULSION;  Surgeon: [name], MD;  Location: [facility];  Service: Orthopedics;  Laterality: Right;  . BACK SURGERY     cervical neck fusion 2002  . FOOT SURGERY Left    topaz procedure  . MASS EXCISION Left 09/02/2012   Procedure: MINOR EXCISION OF CYST LEFT RING FINGER A-3 PULLEY;  Surgeon: Cammie Sickle., MD;  Location: [facility];  Service: Orthopedics;  Laterality: Left;  . neck fusion  2002  . OOPHORECTOMY    . right hand surgery  2003  . WISDOM TOOTH EXTRACTION    . WRIST SURGERY Right    TFCC repair 2003   Social History   Social History Narrative  . Not on file    There is no immunization history on file for this patient.   Objective: Vital Signs: There were no vitals taken for this visit.   Physical Exam   Musculoskeletal Exam: ***  CDAI Exam: CDAI Score: - Patient Global: -; Provider Global: - Swollen: -; Tender: - Joint Exam 09/27/2019   No joint exam has been documented for this visit   There is currently no information documented on the homunculus. Go to the Rheumatology activity and complete the homunculus joint exam.  Investigation: No additional findings.  Imaging: No results found.  Recent Labs: Lab Results  Component Value Date   WBC 14.7 (H) 08/02/2019   HGB 12.0 08/02/2019   PLT 342 08/02/2019   NA 137 08/02/2019   K 4.4 08/02/2019   CL 104 08/02/2019   CO2 23 08/02/2019   GLUCOSE 201 (H) 08/02/2019   BUN 13 08/02/2019   CREATININE 0.64 08/02/2019   BILITOT 0.4 08/01/2019   ALKPHOS 97 08/01/2019   AST 23 08/01/2019   ALT 16 08/01/2019   PROT 7.4  08/01/2019   ALBUMIN 2.6 (L) 08/01/2019   CALCIUM 8.7 (L) 08/02/2019   GFRAA >60 08/02/2019    Speciality Comments: No specialty comments available.  Procedures:  No procedures performed Allergies: Patient has no known allergies.   Assessment / Plan:     Visit Diagnoses: No diagnosis found.  Orders: No orders of the defined types were placed in this encounter.  No orders of the defined types were placed in this encounter.   Face-to-face time spent with patient was *** minutes. Greater than 50% of time was spent in counseling and coordination of care.  Follow-Up Instructions: No follow-ups on file.   Ofilia Neas, PA-C  Note - This record has been created using Dragon software.  Chart creation errors have been sought, but may not always  have been located. Such creation errors do not reflect on  the standard of medical care.

## 2019-09-22 ENCOUNTER — Encounter: Payer: Self-pay | Admitting: Orthopaedic Surgery

## 2019-09-22 ENCOUNTER — Ambulatory Visit (INDEPENDENT_AMBULATORY_CARE_PROVIDER_SITE_OTHER): Payer: 59 | Admitting: Orthopaedic Surgery

## 2019-09-22 ENCOUNTER — Other Ambulatory Visit: Payer: Self-pay

## 2019-09-22 DIAGNOSIS — S86011D Strain of right Achilles tendon, subsequent encounter: Secondary | ICD-10-CM

## 2019-09-22 NOTE — Progress Notes (Signed)
Post-Op Visit Note   Patient: Miranda Ross           Date of Birth: May 05, 1976           MRN: [id number] Visit Date: 09/22/2019 PCP: Everardo Beals, NP   Assessment & Plan: Post Achilles tendon repair 08/01/2019.  Inferior aspect of the incision has an eschar 4 to 5 mm no drainage.  She can plantarflex dorsiflex her ankle.  She can start moving her boot to work on ankle range of motion exercises and can begin some touchdown weightbearing with her ankle.  Heel lift given applied and put inside a cam boot.  She will continue with compression socks.  I will recheck her again in 3 weeks.  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  Chief Complaint:  Chief Complaint  Patient presents with  . Right Ankle - Routine Post Op   Visit Diagnoses:  1. Rupture of right Achilles tendon, subsequent encounter     Plan: Continue to work recheck 3 weeks.  She has stopped pain medication I discussed with her at times stop taking narcotics she can use Aleve or ibuprofen continue with elevation and ice.  Follow-Up Instructions: No follow-ups on file.   Orders:  No orders of the defined types were placed in this encounter.  No orders of the defined types were placed in this encounter.   Imaging: No results found.  PMFS History: Patient Active Problem List   Diagnosis Date Noted  . Rupture of right Achilles tendon 08/01/2019  . Achilles rupture, right 07/21/2019  . Class 3 obesity with body mass index (BMI) of 50.0 to 59.9 in adult 01/29/2017  . Polyclonal gammopathy determined by serum protein electrophoresis 08/21/2016  . MGUS (monoclonal gammopathy of unknown significance) 08/18/2016  . History of asthma 07/28/2016  . Raynaud's disease without gangrene 07/24/2016  . Primary osteoarthritis of both knees 07/24/2016  . DJD (degenerative joint disease), cervical 07/24/2016  . History of gastroesophageal reflux (GERD) 07/24/2016  . ANA positive 07/24/2016  . Trochanteric bursitis of both hips  07/24/2016  . Hyperlipidemia 06/18/2013  . Hematuria 06/18/2013   Past Medical History:  Diagnosis Date  . Arthritis   . Asthma    uses albuterol inh prn  . Diabetes mellitus without complication (HCC)    NIDDM  . PONV (postoperative nausea and vomiting)   . Raynaud's disease   . Sleep apnea 2013   sleep study 2013 at [place], didnt finish study, left early    Family History  Problem Relation Age of Onset  . Diabetes Mother   . Heart disease Mother   . Depression Mother   . Diverticulosis Mother   . Non-Hodgkin's lymphoma Father   . Scleroderma Sister     Past Surgical History:  Procedure Laterality Date  . ACHILLES TENDON SURGERY Right 08/01/2019   Procedure: RIGHT REPAIR ACHILLES TENDON AVULSION;  Surgeon: [name], MD;  Location: [facility];  Service: Orthopedics;  Laterality: Right;  . BACK SURGERY     cervical neck fusion 2002  . FOOT SURGERY Left    topaz procedure  . MASS EXCISION Left 09/02/2012   Procedure: MINOR EXCISION OF CYST LEFT RING FINGER A-3 PULLEY;  Surgeon: Cammie Sickle., MD;  Location: [facility];  Service: Orthopedics;  Laterality: Left;  . neck fusion  2002  . OOPHORECTOMY    . right hand surgery  2003  . WISDOM TOOTH EXTRACTION    . WRIST SURGERY Right  TFCC repair 2003   Social History   Occupational History  . Not on file  Tobacco Use  . Smoking status: Never Smoker  . Smokeless tobacco: Never Used  Substance and Sexual Activity  . Alcohol use: No  . Drug use: No  . Sexual activity: Yes    Birth control/protection: I.U.D.    Comment: Mirena IUD placed 2016

## 2019-09-26 ENCOUNTER — Telehealth: Payer: Self-pay | Admitting: Rheumatology

## 2019-09-26 NOTE — Telephone Encounter (Signed)
She will require an in-office appointment to evaluate the hip pain she is experiencing.  If her symptoms are due to trochanteric bursitis she can have a repeat cortisone injection once cleared by her surgeon.

## 2019-09-26 NOTE — Telephone Encounter (Signed)
Patient advised will require an in-office appointment to evaluate the hip pain she is experiencing.  Patient advised if her symptoms are due to trochanteric bursitis she can have a repeat cortisone injection once cleared by her surgeon. Patient will call to schedule appointment

## 2019-09-26 NOTE — Telephone Encounter (Signed)
Attempted to contact the patient and left message for patient to call the office.  

## 2019-09-26 NOTE — Telephone Encounter (Signed)
Patient tore achilles 07/21/2019. Patient has had to reschedule follow up appointment twice now since achilles tendon surgery. Patient still cannot drive due to surgery. Patient is having a lot of hip pain, and would like to discuss that.  Patient states she was given Hydrocodone 5/325 mg #40 after her surgery. Patient states she received 3 additional refills over 5-6 weeks at #20 each. Patient wanted your to be advised as she is on a narcotic agreement. Patient states she was also given a prescription on tramadol on 09/08/2019. Patient states her endocrinologist has placed her on Gabapentin 100 mg daily. Does patient need to reschedule appointment to in office, or can she do a virtual? Please advise.

## 2019-09-26 NOTE — Telephone Encounter (Signed)
Patient has had to reschedule follow up appointment twice now since achilles tendon surgery. Patient still cannot drive due to surgery. Patient is having a lot of hip pain, and would like to discuss that. Does patient need to reschedule appointment to in office, or can she do a virtual? Patient also wants to inform you of the medication she received due to surgery. Please call to discuss.

## 2019-09-27 ENCOUNTER — Ambulatory Visit: Payer: 59 | Admitting: Physician Assistant

## 2019-09-27 DIAGNOSIS — G894 Chronic pain syndrome: Secondary | ICD-10-CM

## 2019-09-27 DIAGNOSIS — M170 Bilateral primary osteoarthritis of knee: Secondary | ICD-10-CM

## 2019-09-27 DIAGNOSIS — Z8719 Personal history of other diseases of the digestive system: Secondary | ICD-10-CM

## 2019-09-27 DIAGNOSIS — I7300 Raynaud's syndrome without gangrene: Secondary | ICD-10-CM

## 2019-09-27 DIAGNOSIS — Z9889 Other specified postprocedural states: Secondary | ICD-10-CM

## 2019-09-27 DIAGNOSIS — R768 Other specified abnormal immunological findings in serum: Secondary | ICD-10-CM

## 2019-09-27 DIAGNOSIS — Z8709 Personal history of other diseases of the respiratory system: Secondary | ICD-10-CM

## 2019-09-27 DIAGNOSIS — M7062 Trochanteric bursitis, left hip: Secondary | ICD-10-CM

## 2019-09-27 DIAGNOSIS — Z8639 Personal history of other endocrine, nutritional and metabolic disease: Secondary | ICD-10-CM

## 2019-09-27 DIAGNOSIS — Z8679 Personal history of other diseases of the circulatory system: Secondary | ICD-10-CM

## 2019-09-27 DIAGNOSIS — M5030 Other cervical disc degeneration, unspecified cervical region: Secondary | ICD-10-CM

## 2019-10-03 ENCOUNTER — Encounter: Payer: Self-pay | Admitting: Orthopaedic Surgery

## 2019-10-04 ENCOUNTER — Telehealth: Payer: Self-pay | Admitting: *Deleted

## 2019-10-04 MED ORDER — TRAMADOL HCL 50 MG PO TABS: 50.0000 mg | ORAL_TABLET | Freq: Two times a day (BID) | ORAL | 0 refills | Status: DC | PRN

## 2019-10-04 NOTE — Progress Notes (Signed)
Office Visit Note  Patient: Miranda Ross             Date of Birth: [birth year]           MRN: [id number]             PCP: Everardo Beals, NP Referring: Everardo Beals, NP Visit Date: 10/17/2019 Occupation: @GUAROCC @  Subjective:  Left trochanteric bursitis   History of Present Illness: Miranda Ross is a 44 y.o. female with history of Raynaud's, osteoarthritis, and DDD.  Patient reports that she had a right Achilles tendon repair performed on 08/01/2019 by Dr. Lorin Mercy.  She continues to be primarily nonweightbearing and is using a walker to assist with ambulation.  She continues to have discomfort and is taking tramadol for pain relief.  She has been out of work and plans to return to work in August 2021.  She has had to be more sedentary during the postop.  Which has caused increased fatigue.  She presents today with left trochanter bursitis and left shoulder joint pain.  She states that she has been sleeping on her left side at night as well as on her back which is exacerbated her left hip pain.  She would like a cortisone injection today.  She states that her left shoulder started to cause increased discomfort since she has been having to use the walker.  Patient reports that for the past several weeks she is having increased comfort in both knee joints.  She denies any warmth or joint swelling.  She would like to apply for Visco gel injections for both knees.  She denies any other joint pain or joint swelling at this time. She received her first COVID-19 vaccination on 06/17/2019 and her second vaccine in 07/08/2019    Activities of Daily Living:  Patient reports morning stiffness for  30-60  minutes.   Patient Reports nocturnal pain.  Difficulty dressing/grooming: Denies Difficulty climbing stairs: Reports Difficulty getting out of chair: Reports Difficulty using hands for taps, buttons, cutlery, and/or writing: Denies  Review of Systems  Constitutional: Negative for fatigue.    HENT: Negative for mouth sores, mouth dryness and nose dryness.   Eyes: Negative for pain, visual disturbance and dryness.  Respiratory: Negative for cough, hemoptysis, shortness of breath and difficulty breathing.   Cardiovascular: Negative for chest pain, palpitations, hypertension and swelling in legs/feet.  Gastrointestinal: Negative for blood in stool, constipation and diarrhea.  Endocrine: Negative for increased urination.  Genitourinary: Negative for painful urination.  Musculoskeletal: Positive for arthralgias, joint pain and morning stiffness. Negative for joint swelling, myalgias, muscle weakness, muscle tenderness and myalgias.  Skin: Positive for color change. Negative for pallor, rash, hair loss, nodules/bumps, skin tightness, ulcers and sensitivity to sunlight.  Allergic/Immunologic: Negative for susceptible to infections.  Neurological: Negative for dizziness, numbness, headaches and weakness.  Hematological: Negative for swollen glands.  Psychiatric/Behavioral: Negative for depressed mood and sleep disturbance. The patient is not nervous/anxious.     PMFS History:  Patient Active Problem List   Diagnosis Date Noted  . Rupture of right Achilles tendon 08/01/2019  . Achilles rupture, right 07/21/2019  . Class 3 obesity with body mass index (BMI) of 50.0 to 59.9 in adult 01/29/2017  . Polyclonal gammopathy determined by serum protein electrophoresis 08/21/2016  . MGUS (monoclonal gammopathy of unknown significance) 08/18/2016  . History of asthma 07/28/2016  . Raynaud's disease without gangrene 07/24/2016  . Primary osteoarthritis of both knees 07/24/2016  . DJD (degenerative joint disease), cervical  07/24/2016  . History of gastroesophageal reflux (GERD) 07/24/2016  . ANA positive 07/24/2016  . Trochanteric bursitis of both hips 07/24/2016  . Hyperlipidemia 06/18/2013  . Hematuria 06/18/2013    Past Medical History:  Diagnosis Date  . Arthritis   . Asthma    uses  albuterol inh prn  . Diabetes mellitus without complication (HCC)    NIDDM  . PONV (postoperative nausea and vomiting)   . Raynaud's disease   . Sleep apnea 2013   sleep study 2013 at [place], didnt finish study, left early    Family History  Problem Relation Age of Onset  . Diabetes Mother   . Heart disease Mother   . Depression Mother   . Diverticulosis Mother   . Non-Hodgkin's lymphoma Father   . Scleroderma Sister    Past Surgical History:  Procedure Laterality Date  . ACHILLES TENDON SURGERY Right 08/01/2019   Procedure: RIGHT REPAIR ACHILLES TENDON AVULSION;  Surgeon: [name], MD;  Location: [facility];  Service: Orthopedics;  Laterality: Right;  . BACK SURGERY     cervical neck fusion 2002  . FOOT SURGERY Left    topaz procedure  . MASS EXCISION Left 09/02/2012   Procedure: MINOR EXCISION OF CYST LEFT RING FINGER A-3 PULLEY;  Surgeon: Cammie Sickle., MD;  Location: [facility];  Service: Orthopedics;  Laterality: Left;  . neck fusion  2002  . OOPHORECTOMY    . right hand surgery  2003  . WISDOM TOOTH EXTRACTION    . WRIST SURGERY Right    TFCC repair 2003   Social History   Social History Narrative  . Not on file    There is no immunization history on file for this patient.   Objective: Vital Signs: BP (!) 143/102 (BP Location: Left Wrist, Patient Position: Sitting, Cuff Size: Normal)   Pulse 97   Resp 16   Ht 5\' 3"  (1.6 m)   Wt 279 lb (126.6 kg)   BMI 49.42 kg/m    Physical Exam Vitals and nursing note reviewed.  Constitutional:      Appearance: She is well-developed.  HENT:     Head: Normocephalic and atraumatic.  Eyes:     Conjunctiva/sclera: Conjunctivae normal.  Pulmonary:     Effort: Pulmonary effort is normal.  Abdominal:     General: Bowel sounds are normal.     Palpations: Abdomen is soft.  Musculoskeletal:     Cervical back: Normal range of motion.  Lymphadenopathy:     Cervical: No cervical adenopathy.  Skin:     General: Skin is warm and dry.     Capillary Refill: Capillary refill takes less than 2 seconds.  Neurological:     Mental Status: She is alert and oriented to person, place, and time.  Psychiatric:        Behavior: Behavior normal.      Musculoskeletal Exam: C-spine, thoracic spine, lumbar spine have good range of motion.  No midline spinal tenderness.  She has full range of motion of both shoulders with discomfort in the left shoulder especially with abduction and internal rotation.  Elbow joints, wrist joints, MCPs, PIPs, DIPs have good range of motion with no synovitis.  She has complete fist formation bilaterally.  Hip joints have good range of motion with no discomfort.  She has tenderness over the left trochanteric bursa.  Knee joints have good range of motion with no warmth or effusion.  She has bilateral knee crepitus.  Left  ankle joint has good range of motion with no tenderness or inflammation.  Postsurgical boot on right lower extremity.  CDAI Exam: CDAI Score: -- Patient Global: --; Provider Global: -- Swollen: --; Tender: -- Joint Exam 10/17/2019   No joint exam has been documented for this visit   There is currently no information documented on the homunculus. Go to the Rheumatology activity and complete the homunculus joint exam.  Investigation: No additional findings.  Imaging: No results found.  Recent Labs: Lab Results  Component Value Date   WBC 14.7 (H) 08/02/2019   HGB 12.0 08/02/2019   PLT 342 08/02/2019   NA 137 08/02/2019   K 4.4 08/02/2019   CL 104 08/02/2019   CO2 23 08/02/2019   GLUCOSE 201 (H) 08/02/2019   BUN 13 08/02/2019   CREATININE 0.64 08/02/2019   BILITOT 0.4 08/01/2019   ALKPHOS 97 08/01/2019   AST 23 08/01/2019   ALT 16 08/01/2019   PROT 7.4 08/01/2019   ALBUMIN 2.6 (L) 08/01/2019   CALCIUM 8.7 (L) 08/02/2019   GFRAA >60 08/02/2019    Speciality Comments: No specialty comments available.  Procedures:  Large Joint Inj: L  greater trochanter on 10/17/2019 10:30 AM Indications: pain Details: 27 G 1.5 in needle, lateral approach  Arthrogram: No  Medications: 40 mg triamcinolone acetonide 40 MG/ML; 1.5 mL lidocaine 1 % Aspirate: 0 mL Outcome: tolerated well, no immediate complications Procedure, treatment alternatives, risks and benefits explained, specific risks discussed. Consent was given by the patient. Immediately prior to procedure a time out was called to verify the correct patient, procedure, equipment, support staff and site/side marked as required. Patient was prepped and draped in the usual sterile fashion.     Allergies: Patient has no known allergies.   Assessment / Plan:     Visit Diagnoses: Raynaud's disease without gangrene: She experiences intermittent symptoms of Raynaud's but her symptoms have been less frequent and less severe with warmer weather temperatures.  No digital ulcerations or signs of gangrene were noted on exam.  She has good capillary refill less than 2 seconds.  No nailbed capillary changes or skin thickening noted.  We discussed the importance of avoiding triggers and wearing gloves, drinking warm fluids, and keeping her core body temperature warm.  She was advised to notify us if she develops any new or worsening symptoms.  ANA positive - No clinical features of autoimmune disease at this time.  She is advised to notify us if she develops any new or worsening symptoms.  Primary osteoarthritis of both knees: She has been experiencing increased discomfort in both knee joints for the past several weeks.  She has good range of motion of both knees with bilateral crepitus.  No warmth or effusion was noted.  She would like to reapply for Hyalgan injections for both knee joints.  She had good results with Hyalgan in the past.  She will require updated x-rays at her follow-up visit.  DDD (degenerative disc disease), cervical: She has good range of motion of the C-spine on exam.  She has no  symptoms of radiculopathy at this time.  Chronic pain syndrome: She is taking tramadol 50 mg 1 tablet twice daily as needed for pain relief.  UDS and narcotic agreement were updated today on 10/17/2019.  Medication monitoring encounter -UDS and narcotic agreement were updated today on 10/17/2019.  Plan: DRUG MONITOR, PANEL 5, W/CONF, URINE, DRUG MONITOR, TRAMADOL,QN, URINE  Trochanteric bursitis of left hip: She presents today with discomfort due to  left trochanter bursitis.  She has tenderness to palpation on exam.  She did having increased discomfort which she attributes to gait changes while wearing a boot on her right lower extremity as well as being a side sleeper.  She had a cortisone injection on 02/03/2019.  She requested a repeat injection today.  She tolerated the procedure well.  The procedure note was completed above.  She was encouraged to perform stretching exercises as recommended.  Rupture of right Achilles tendon, subsequent encounter: Status post repair by Dr. Lorin Mercy on 08/01/2019.  She continues to wear a boot on her right lower extremity.  According to the patient she is 50% weightbearing.  She continues to use a walker to assist with ambulation.  She continues take tramadol as needed for pain relief.  Other medical conditions are listed as follows:  History of vitamin D deficiency  History of gastroesophageal reflux (GERD)  History of hyperlipidemia  History of asthma  History of hypertension  History of diabetes mellitus  Orders: Orders Placed This Encounter  Procedures  . Large Joint Inj  . DRUG MONITOR, PANEL 5, W/CONF, URINE  . DRUG MONITOR, TRAMADOL,QN, URINE   No orders of the defined types were placed in this encounter.    Follow-Up Instructions: Return in about 6 months (around 04/17/2020) for Raynaud's syndrome, Osteoarthritis, DDD.   Ofilia Neas, PA-C  Note - This record has been created using Dragon software.  Chart creation errors have been sought,  but may not always  have been located. Such creation errors do not reflect on  the standard of medical care.

## 2019-10-04 NOTE — Telephone Encounter (Signed)
error 

## 2019-10-04 NOTE — Addendum Note (Signed)
Addended by: Carole Binning on: 10/04/2019 09:27 AM   Modules accepted: Orders

## 2019-10-04 NOTE — Telephone Encounter (Signed)
Spoke with patient and offered appointment for this week. Patient states her daughter is her transportation to the office and has exams this week and next. Per patient request appointment scheduled for 10/17/2019 @ 9:40 am. Patient is requesting refill on Tramadol.  Last Visit: 01/30/2019 Next Visit: 10/17/2019 UDS: 02/03/2019 Narc Agreement: 02/03/2019  Last Fill from our office: 07/06/2019. Patient has recently had surgery and reported receiving pain medication due to surgery.   Okay to refill Tramadol?

## 2019-10-17 ENCOUNTER — Ambulatory Visit: Payer: 59 | Admitting: Physician Assistant

## 2019-10-17 ENCOUNTER — Telehealth: Payer: Self-pay

## 2019-10-17 ENCOUNTER — Encounter: Payer: Self-pay | Admitting: Physician Assistant

## 2019-10-17 ENCOUNTER — Other Ambulatory Visit: Payer: Self-pay

## 2019-10-17 VITALS — BP 143/102 | HR 97 | Resp 16 | Ht 63.0 in | Wt 279.0 lb

## 2019-10-17 DIAGNOSIS — Z8709 Personal history of other diseases of the respiratory system: Secondary | ICD-10-CM

## 2019-10-17 DIAGNOSIS — Z8719 Personal history of other diseases of the digestive system: Secondary | ICD-10-CM

## 2019-10-17 DIAGNOSIS — R768 Other specified abnormal immunological findings in serum: Secondary | ICD-10-CM | POA: Diagnosis not present

## 2019-10-17 DIAGNOSIS — M170 Bilateral primary osteoarthritis of knee: Secondary | ICD-10-CM | POA: Diagnosis not present

## 2019-10-17 DIAGNOSIS — G894 Chronic pain syndrome: Secondary | ICD-10-CM

## 2019-10-17 DIAGNOSIS — M5030 Other cervical disc degeneration, unspecified cervical region: Secondary | ICD-10-CM | POA: Diagnosis not present

## 2019-10-17 DIAGNOSIS — M7062 Trochanteric bursitis, left hip: Secondary | ICD-10-CM

## 2019-10-17 DIAGNOSIS — Z8679 Personal history of other diseases of the circulatory system: Secondary | ICD-10-CM

## 2019-10-17 DIAGNOSIS — S86011D Strain of right Achilles tendon, subsequent encounter: Secondary | ICD-10-CM

## 2019-10-17 DIAGNOSIS — Z5181 Encounter for therapeutic drug level monitoring: Secondary | ICD-10-CM

## 2019-10-17 DIAGNOSIS — Z8639 Personal history of other endocrine, nutritional and metabolic disease: Secondary | ICD-10-CM

## 2019-10-17 DIAGNOSIS — I7300 Raynaud's syndrome without gangrene: Secondary | ICD-10-CM | POA: Diagnosis not present

## 2019-10-17 NOTE — Telephone Encounter (Signed)
Please apply for bilateral knee hyalgan, per Hazel Sams, PA-C. Thanks!

## 2019-10-18 NOTE — Telephone Encounter (Signed)
Submitted for VOB 10/18/19.

## 2019-10-19 LAB — DRUG MONITOR, TRAMADOL,QN, URINE
Desmethyltramadol: >10000 ng/mL — ABNORMAL HIGH (ref <100)
Tramadol: >10000 ng/mL — ABNORMAL HIGH (ref <100)

## 2019-10-19 LAB — DRUG MONITOR, PANEL 5, W/CONF, URINE
Amphetamines: NEGATIVE ng/mL (ref <500)
Barbiturates: NEGATIVE ng/mL (ref <300)
Benzodiazepines: NEGATIVE ng/mL (ref <100)
Cocaine Metabolite: NEGATIVE ng/mL (ref <150)
Creatinine: >300 mg/dL
Marijuana Metabolite: NEGATIVE ng/mL (ref <20)
Methadone Metabolite: NEGATIVE ng/mL (ref <100)
Opiates: NEGATIVE ng/mL (ref <100)
Oxidant: NEGATIVE ug/mL
Oxycodone: NEGATIVE ng/mL (ref <100)
pH: 6 (ref 4.5–9.0)

## 2019-10-19 LAB — DM TEMPLATE

## 2019-10-19 NOTE — Telephone Encounter (Signed)
I LMOM for patient to call to discuss VOB, and schedule Visco injections.

## 2019-10-19 NOTE — Telephone Encounter (Signed)
Please call to schedule Visco knee injections.  Authorized for [employer] series, Bilateral knees [employer].  Deductible does not apply.  No PA required.  Insurance to cover 100% of allowable cost. No Copay.

## 2019-10-19 NOTE — Progress Notes (Signed)
UDS is consistent with treatment.

## 2019-10-20 ENCOUNTER — Telehealth: Payer: Self-pay | Admitting: Rheumatology

## 2019-10-20 NOTE — Telephone Encounter (Signed)
Ok to schedule Euflexxa injection the same day.

## 2019-10-20 NOTE — Telephone Encounter (Signed)
Patient called stating her Euflexxa injections have been approved.  Patient states she is already scheduled for a shoulder injection on Tuesday, 10/25/19 and is asking if she can have the Euflexxa injections at that appointment.Please advise.

## 2019-10-20 NOTE — Telephone Encounter (Signed)
Patient advised she is able to have her Euflexxa injection on the same day as her shoulder injection. Appointment noted and patient will schedule remaining appointment at that visit.

## 2019-10-25 ENCOUNTER — Other Ambulatory Visit: Payer: Self-pay

## 2019-10-25 ENCOUNTER — Ambulatory Visit (INDEPENDENT_AMBULATORY_CARE_PROVIDER_SITE_OTHER): Payer: 59 | Admitting: Physician Assistant

## 2019-10-25 VITALS — BP 159/99 | HR 93

## 2019-10-25 DIAGNOSIS — M1711 Unilateral primary osteoarthritis, right knee: Secondary | ICD-10-CM

## 2019-10-25 DIAGNOSIS — M1712 Unilateral primary osteoarthritis, left knee: Secondary | ICD-10-CM | POA: Diagnosis not present

## 2019-10-25 DIAGNOSIS — M170 Bilateral primary osteoarthritis of knee: Secondary | ICD-10-CM

## 2019-10-25 DIAGNOSIS — M25512 Pain in left shoulder: Secondary | ICD-10-CM | POA: Diagnosis not present

## 2019-10-25 DIAGNOSIS — G8929 Other chronic pain: Secondary | ICD-10-CM | POA: Diagnosis not present

## 2019-10-25 MED ORDER — SODIUM HYALURONATE (VISCOSUP) 20 MG/2ML IX SOSY
20.0000 mg | PREFILLED_SYRINGE | INTRA_ARTICULAR | Status: AC | PRN
  Administered 2019-10-25: 20 mg via INTRA_ARTICULAR

## 2019-10-25 MED ORDER — LIDOCAINE HCL 1 % IJ SOLN
1.5000 mL | INTRAMUSCULAR | Status: AC | PRN
  Administered 2019-10-25: 1.5 mL

## 2019-10-25 MED ORDER — TRIAMCINOLONE ACETONIDE 40 MG/ML IJ SUSP
40.0000 mg | INTRAMUSCULAR | Status: AC | PRN
  Administered 2019-10-25: 40 mg via INTRA_ARTICULAR

## 2019-10-25 NOTE — Progress Notes (Signed)
   Procedure Note  Patient: Miranda Ross             Date of Birth: [birth year]           MRN: [id number]             Visit Date: 10/25/2019  Procedures: Visit Diagnoses:  1. Primary osteoarthritis of both knees   2. Chronic left shoulder pain     Euflexxa #1 bilateral knee, B/B Large Joint Inj: bilateral knee on 10/25/2019 10:48 AM Indications: pain Details: 27 G 1.5 in needle, medial approach  Arthrogram: No  Medications (Right): 1.5 mL lidocaine 1 %; 20 mg Sodium Hyaluronate 20 MG/2ML Aspirate (Right): 0 mL Medications (Left): 1.5 mL lidocaine 1 %; 20 mg Sodium Hyaluronate 20 MG/2ML Aspirate (Left): 0 mL Outcome: tolerated well, no immediate complications Procedure, treatment alternatives, risks and benefits explained, specific risks discussed. Consent was given by the patient. Immediately prior to procedure a time out was called to verify the correct patient, procedure, equipment, support staff and site/side marked as required. Patient was prepped and draped in the usual sterile fashion.   Large Joint Inj: L glenohumeral on 10/25/2019 11:22 AM Indications: pain Details: 27 G 1.5 in needle, posterior approach  Arthrogram: No  Medications: 1.5 mL lidocaine 1 %; 40 mg triamcinolone acetonide 40 MG/ML Aspirate: 0 mL Outcome: tolerated well, no immediate complications Procedure, treatment alternatives, risks and benefits explained, specific risks discussed. Consent was given by the patient. Immediately prior to procedure a time out was called to verify the correct patient, procedure, equipment, support staff and site/side marked as required. Patient was prepped and draped in the usual sterile fashion.      Patient tolerated both procedures.  Aftercare was discussed.  She was advised to monitor her blood pressure closely following the cortisone injection today.   Hazel Sams, PA-C

## 2019-10-27 ENCOUNTER — Other Ambulatory Visit: Payer: Self-pay

## 2019-10-27 ENCOUNTER — Encounter: Payer: Self-pay | Admitting: Orthopaedic Surgery

## 2019-10-27 ENCOUNTER — Ambulatory Visit (INDEPENDENT_AMBULATORY_CARE_PROVIDER_SITE_OTHER): Payer: 59 | Admitting: Orthopaedic Surgery

## 2019-10-27 VITALS — BP 150/108 | HR 103 | Ht 63.0 in | Wt 279.0 lb

## 2019-10-27 DIAGNOSIS — S86011D Strain of right Achilles tendon, subsequent encounter: Secondary | ICD-10-CM

## 2019-10-27 NOTE — Progress Notes (Signed)
Post-Op Visit Note   Patient: Miranda Ross           Date of Birth: [birth year]           MRN: [id number] Visit Date: 10/27/2019 PCP: Everardo Beals, NP   Assessment & Plan: Follow-up right Achilles tendon repair.  She has been for nonweightbearing gradually increasing.  Incision looks good there is no drainage.  She was started on some gabapentin by her endocrinologist which is helped the neuropathy related to her diabetes.  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  Recheck 1 month.  Chief Complaint:  Chief Complaint  Patient presents with  . Right Ankle - Follow-up    08/01/2019 Right Achilles Repair   Visit Diagnoses: Right Achilles tendon rupture repair.  Plan: Return 5 weeks.  Plan for work resumption August 1.  We discussed Achilles stretching exercises in a standing position gradual increasing her walking.  I discussed with her I do not think she will need formal physical therapy but she will call if she feels like she would like to have been ordered.  Prescription given for single-point cane.  Recheck 5 weeks.  Follow-Up Instructions: Return in about 5 weeks (around 12/01/2019).   Orders:  No orders of the defined types were placed in this encounter.  No orders of the defined types were placed in this encounter.   Imaging: No results found.  PMFS History: Patient Active Problem List   Diagnosis Date Noted  . Rupture of right Achilles tendon 08/01/2019  . Achilles rupture, right 07/21/2019  . Class 3 obesity with body mass index (BMI) of 50.0 to 59.9 in adult 01/29/2017  . Polyclonal gammopathy determined by serum protein electrophoresis 08/21/2016  . MGUS (monoclonal gammopathy of  unknown significance) 08/18/2016  . History of asthma 07/28/2016  . Raynaud's disease without gangrene 07/24/2016  . Primary osteoarthritis of both knees 07/24/2016  . DJD (degenerative joint disease), cervical 07/24/2016  . History of gastroesophageal reflux (GERD) 07/24/2016  . ANA positive 07/24/2016  . Trochanteric bursitis of both hips 07/24/2016  . Hyperlipidemia 06/18/2013  . Hematuria 06/18/2013   Past Medical History:  Diagnosis Date  . Arthritis   . Asthma    uses albuterol inh prn  . Diabetes mellitus without complication (HCC)    NIDDM  . PONV (postoperative nausea and vomiting)   . Raynaud's disease   . Sleep apnea 2013   sleep study 2013 at [place], didnt finish study, left early    Family History  Problem Relation Age of Onset  . Diabetes Mother   . Heart disease Mother   . Depression Mother   . Diverticulosis Mother   . Non-Hodgkin's lymphoma Father   . Scleroderma Sister     Past Surgical History:  Procedure Laterality Date  . ACHILLES TENDON SURGERY Right 08/01/2019   Procedure: RIGHT REPAIR ACHILLES TENDON AVULSION;  Surgeon: [name], MD;  Location: [facility];  Service: Orthopedics;  Laterality: Right;  . BACK SURGERY     cervical neck fusion 2002  . FOOT SURGERY Left    topaz procedure  . MASS EXCISION Left 09/02/2012  Procedure: MINOR EXCISION OF CYST LEFT RING FINGER A-3 PULLEY;  Surgeon: Cammie Sickle., MD;  Location: [facility];  Service: Orthopedics;  Laterality: Left;  . neck fusion  2002  . OOPHORECTOMY    . right hand surgery  2003  . WISDOM TOOTH EXTRACTION    . WRIST SURGERY Right    TFCC repair 2003   Social History   Occupational History  . Not on file  Tobacco Use  . Smoking status: Never Smoker  . Smokeless tobacco: Never Used  Vaping Use  . Vaping Use: Never used  Substance and Sexual Activity  . Alcohol use: No  . Drug use: No  . Sexual activity: Yes    Birth control/protection: I.U.D.    Comment:  Mirena IUD placed 2016

## 2019-10-31 ENCOUNTER — Other Ambulatory Visit: Payer: Self-pay

## 2019-10-31 ENCOUNTER — Ambulatory Visit (INDEPENDENT_AMBULATORY_CARE_PROVIDER_SITE_OTHER): Payer: 59 | Admitting: Rheumatology

## 2019-10-31 DIAGNOSIS — M170 Bilateral primary osteoarthritis of knee: Secondary | ICD-10-CM | POA: Diagnosis not present

## 2019-10-31 MED ORDER — LIDOCAINE HCL 1 % IJ SOLN
1.5000 mL | INTRAMUSCULAR | Status: AC | PRN
  Administered 2019-10-31: 1.5 mL

## 2019-10-31 MED ORDER — SODIUM HYALURONATE (VISCOSUP) 20 MG/2ML IX SOSY
20.0000 mg | PREFILLED_SYRINGE | INTRA_ARTICULAR | Status: AC | PRN
  Administered 2019-10-31: 20 mg via INTRA_ARTICULAR

## 2019-10-31 NOTE — Progress Notes (Signed)
   Procedure Note  Patient: Miranda Ross             Date of Birth: 06-22-1975           MRN: [id number]             Visit Date: 10/31/2019  Procedures: Visit Diagnoses:  1. Primary osteoarthritis of both knees     Large Joint Inj: bilateral knee on 10/31/2019 10:13 AM Indications: pain Details: 27 G 1.5 in needle, medial approach  Arthrogram: No  Medications (Right): 1.5 mL lidocaine 1 %; 20 mg Sodium Hyaluronate 20 MG/2ML Medications (Left): 1.5 mL lidocaine 1 %; 20 mg Sodium Hyaluronate 20 MG/2ML Outcome: tolerated well, no immediate complications Procedure, treatment alternatives, risks and benefits explained, specific risks discussed. Consent was given by the patient. Immediately prior to procedure a time out was called to verify the correct patient, procedure, equipment, support staff and site/side marked as required. Patient was prepped and draped in the usual sterile fashion.    Bo Merino, MD

## 2019-11-07 ENCOUNTER — Other Ambulatory Visit: Payer: Self-pay

## 2019-11-07 ENCOUNTER — Ambulatory Visit (INDEPENDENT_AMBULATORY_CARE_PROVIDER_SITE_OTHER): Payer: 59 | Admitting: Physician Assistant

## 2019-11-07 DIAGNOSIS — M170 Bilateral primary osteoarthritis of knee: Secondary | ICD-10-CM

## 2019-11-07 MED ORDER — LIDOCAINE HCL 1 % IJ SOLN
1.5000 mL | INTRAMUSCULAR | Status: AC | PRN
  Administered 2019-11-07: 1.5 mL

## 2019-11-07 MED ORDER — SODIUM HYALURONATE (VISCOSUP) 20 MG/2ML IX SOSY
20.0000 mg | PREFILLED_SYRINGE | INTRA_ARTICULAR | Status: AC | PRN
  Administered 2019-11-07: 20 mg via INTRA_ARTICULAR

## 2019-11-07 MED ORDER — LIDOCAINE HCL 1 % IJ SOLN
1.5000 mL | INTRAMUSCULAR | Status: AC | PRN
Start: 2019-11-07 — End: 2019-11-07
  Administered 2019-11-07: 1.5 mL

## 2019-11-07 NOTE — Progress Notes (Signed)
   Procedure Note  Patient: Miranda Ross             Date of Birth: 1975-05-16           MRN: [id number]             Visit Date: 11/07/2019  Procedures: Visit Diagnoses:  1. Primary osteoarthritis of both knees    Euflexxa #3 Bilateral knee joint injections  Large Joint Inj: bilateral knee on 11/07/2019 8:23 AM Indications: pain Details: 27 G 1.5 in needle, medial approach  Arthrogram: No  Medications (Right): 1.5 mL lidocaine 1 %; 20 mg Sodium Hyaluronate 20 MG/2ML Aspirate (Right): 0 mL Medications (Left): 1.5 mL lidocaine 1 %; 20 mg Sodium Hyaluronate 20 MG/2ML Aspirate (Left): 0 mL Outcome: tolerated well, no immediate complications Procedure, treatment alternatives, risks and benefits explained, specific risks discussed. Consent was given by the patient. Immediately prior to procedure a time out was called to verify the correct patient, procedure, equipment, support staff and site/side marked as required. Patient was prepped and draped in the usual sterile fashion.      Patient tolerated the procedure well.  Aftercare was discussed.  Hazel Sams, PA-C

## 2019-11-14 ENCOUNTER — Other Ambulatory Visit: Payer: Self-pay | Admitting: Rheumatology

## 2019-11-14 ENCOUNTER — Encounter: Payer: Self-pay | Admitting: Orthopaedic Surgery

## 2019-11-15 ENCOUNTER — Telehealth: Payer: Self-pay | Admitting: Rheumatology

## 2019-11-15 ENCOUNTER — Telehealth: Payer: Self-pay | Admitting: Orthopaedic Surgery

## 2019-11-15 MED ORDER — TRAMADOL HCL 50 MG PO TABS: 50.0000 mg | ORAL_TABLET | Freq: Two times a day (BID) | ORAL | 0 refills | Status: DC | PRN

## 2019-11-15 NOTE — Telephone Encounter (Signed)
Last Visit: 10/17/2019 Next Visit: 04/16/2020 UDS: 10/17/2019  Narc Agreement: 10/17/2019  Last Fill: 10/04/2019  Okay to refill Tramadol?

## 2019-11-15 NOTE — Telephone Encounter (Signed)
I called patient. Paperwork updated and faxed per her request.

## 2019-11-15 NOTE — Telephone Encounter (Signed)
Please advise. Is this Dr Lorin Mercy patient and would he like to refill? Thank you

## 2019-11-15 NOTE — Telephone Encounter (Signed)
Please advise 

## 2019-11-15 NOTE — Telephone Encounter (Signed)
Patient request refill on Tramadol sent to [facility] on [address].

## 2019-11-15 NOTE — Telephone Encounter (Signed)
MY pt

## 2019-11-15 NOTE — Telephone Encounter (Signed)
Patient called. Would like Betsy to call her about her disability forms. Her call back number is [phone]

## 2019-11-15 NOTE — Telephone Encounter (Signed)
Time to stop narcotic meds.

## 2019-11-24 ENCOUNTER — Other Ambulatory Visit: Payer: Self-pay

## 2019-11-24 ENCOUNTER — Ambulatory Visit (INDEPENDENT_AMBULATORY_CARE_PROVIDER_SITE_OTHER): Payer: 59 | Admitting: Orthopaedic Surgery

## 2019-11-24 DIAGNOSIS — S86011D Strain of right Achilles tendon, subsequent encounter: Secondary | ICD-10-CM

## 2019-11-24 DIAGNOSIS — S86011S Strain of right Achilles tendon, sequela: Secondary | ICD-10-CM

## 2019-11-24 NOTE — Progress Notes (Signed)
Office Visit Note   Patient: Miranda Ross           Date of Birth: 1975/11/02           MRN: [id number] Visit Date: 11/24/2019              Requested by: Everardo Beals, NP [place],  [place] [address] PCP: Everardo Beals, NP   Assessment & Plan: Visit Diagnoses: No diagnosis found.  Plan: Patient is making progress post Achilles tendon repair 08/01/2019.  Work note given out of work until 12/12/2019.  At that time she can begin working 4 hours/day x 1 week then 6 hours/day x 1 week and then full 8-hour days after that time.  If for some reason they would not allow part-time work and she wants to resume full days she will call and let us know.  I plan to recheck her again in 2 months.  If she is doing great she can cancel her appointment.  Follow-Up Instructions: No follow-ups on file.   Orders:  No orders of the defined types were placed in this encounter.  No orders of the defined types were placed in this encounter.     Procedures: No procedures performed   Clinical Data: No additional findings.   Subjective: Chief Complaint  Patient presents with  . Post-op Follow-up    HPI 44 year old female returns post right Achilles tendon repair 08/01/2019.  She states she has been taking her insulin and her A1c is significantly improved.  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  Patient been working on walking she is currently using a cane she can ambulate in the house without the cane she is working on range of motion and gradual Strengthening.  Review of Systems 14 point update noncontributory and unchanged from previous office note.   Objective: Vital Signs: There were no vitals taken for this visit.  Physical Exam Constitutional:      Appearance: She is well-developed.  HENT:     Head: Normocephalic.     Right Ear: External ear normal.     Left Ear: External ear normal.  Eyes:     Pupils: Pupils are equal,  round, and reactive to light.  Neck:     Thyroid: No thyromegaly.     Trachea: No tracheal deviation.  Cardiovascular:     Rate and Rhythm: Normal rate.  Pulmonary:     Effort: Pulmonary effort is normal.  Abdominal:     Palpations: Abdomen is soft.  Skin:    General: Skin is warm and dry.  Neurological:     Mental Status: She is alert and oriented to person, place, and time.  Psychiatric:        Behavior: Behavior normal.     Ortho Exam Achilles tendon is in 1 piece she has good gastrocsoleus resistive testing.  Scar is healed no drainage.  Specialty Comments:  No specialty comments available.  Imaging: No results found.   PMFS History: Patient Active Problem List   Diagnosis Date Noted  . Rupture of right Achilles tendon 08/01/2019  . Achilles rupture, right 07/21/2019  . Class 3 obesity with body mass index (BMI) of 50.0 to 59.9 in adult 01/29/2017  . Polyclonal gammopathy determined by serum protein electrophoresis 08/21/2016  . MGUS (monoclonal gammopathy of unknown significance) 08/18/2016  . History of asthma 07/28/2016  . Raynaud's disease without gangrene 07/24/2016  . Primary osteoarthritis of both knees 07/24/2016  .  DJD (degenerative joint disease), cervical 07/24/2016  . History of gastroesophageal reflux (GERD) 07/24/2016  . ANA positive 07/24/2016  . Trochanteric bursitis of both hips 07/24/2016  . Hyperlipidemia 06/18/2013  . Hematuria 06/18/2013   Past Medical History:  Diagnosis Date  . Arthritis   . Asthma    uses albuterol inh prn  . Diabetes mellitus without complication (HCC)    NIDDM  . PONV (postoperative nausea and vomiting)   . Raynaud's disease   . Sleep apnea 2013   sleep study 2013 at [place], didnt finish study, left early    Family History  Problem Relation Age of Onset  . Diabetes Mother   . Heart disease Mother   . Depression Mother   . Diverticulosis Mother   . Non-Hodgkin's lymphoma Father   . Scleroderma Sister       Past Surgical History:  Procedure Laterality Date  . ACHILLES TENDON SURGERY Right 08/01/2019   Procedure: RIGHT REPAIR ACHILLES TENDON AVULSION;  Surgeon: [name], MD;  Location: [facility];  Service: Orthopedics;  Laterality: Right;  . BACK SURGERY     cervical neck fusion 2002  . FOOT SURGERY Left    topaz procedure  . MASS EXCISION Left 09/02/2012   Procedure: MINOR EXCISION OF CYST LEFT RING FINGER A-3 PULLEY;  Surgeon: Cammie Sickle., MD;  Location: [facility];  Service: Orthopedics;  Laterality: Left;  . neck fusion  2002  . OOPHORECTOMY    . right hand surgery  2003  . WISDOM TOOTH EXTRACTION    . WRIST SURGERY Right    TFCC repair 2003   Social History   Occupational History  . Not on file  Tobacco Use  . Smoking status: Never Smoker  . Smokeless tobacco: Never Used  Vaping Use  . Vaping Use: Never used  Substance and Sexual Activity  . Alcohol use: No  . Drug use: No  . Sexual activity: Yes    Birth control/protection: I.U.D.    Comment: Mirena IUD placed 2016

## 2019-11-29 ENCOUNTER — Encounter: Payer: Self-pay | Admitting: Orthopaedic Surgery

## 2019-12-23 ENCOUNTER — Encounter: Payer: Self-pay | Admitting: Orthopaedic Surgery

## 2019-12-27 ENCOUNTER — Telehealth: Payer: Self-pay | Admitting: Orthopaedic Surgery

## 2019-12-27 NOTE — Telephone Encounter (Signed)
This message is for [employer]. Patient asked for a call back to discuss her schedule. Patient would only like to discuss appt with Besty. Patient phone number is [phone].

## 2019-12-27 NOTE — Telephone Encounter (Signed)
Patient is coming in next Tuesday to have you check incision where she continues to have some blistering.  She was unable to come in today.  She is supposed to ramp work hours up to 6 hours/day next week as opposed to the 4 hours/day that she has been working. Is it ok to go up to 6 hours/day beginning Monday or would you like for her to stay at the 4 hours/day until she is seen in the office.

## 2019-12-27 NOTE — Telephone Encounter (Signed)
[facility]call. OK .

## 2019-12-29 NOTE — Telephone Encounter (Signed)
I called patient. She feels that she should stay at 4 hours/day until she is seen on Tuesday in case Dr. Lorin Mercy decides she needs to continue four hour days for a longer period of time. Note entered into system. Patient will pull from My Chart.

## 2020-01-03 ENCOUNTER — Encounter: Payer: Self-pay | Admitting: Orthopaedic Surgery

## 2020-01-03 ENCOUNTER — Ambulatory Visit (INDEPENDENT_AMBULATORY_CARE_PROVIDER_SITE_OTHER): Payer: 59 | Admitting: Orthopaedic Surgery

## 2020-01-03 VITALS — BP 142/92 | Ht 63.0 in | Wt 279.0 lb

## 2020-01-03 DIAGNOSIS — Z9889 Other specified postprocedural states: Secondary | ICD-10-CM

## 2020-01-03 NOTE — Progress Notes (Signed)

## 2020-01-04 ENCOUNTER — Other Ambulatory Visit: Payer: Self-pay

## 2020-01-04 ENCOUNTER — Ambulatory Visit (INDEPENDENT_AMBULATORY_CARE_PROVIDER_SITE_OTHER): Payer: 59 | Admitting: Physical Therapy

## 2020-01-04 ENCOUNTER — Encounter: Payer: Self-pay | Admitting: Physical Therapy

## 2020-01-04 DIAGNOSIS — R262 Difficulty in walking, not elsewhere classified: Secondary | ICD-10-CM | POA: Diagnosis not present

## 2020-01-04 DIAGNOSIS — M25571 Pain in right ankle and joints of right foot: Secondary | ICD-10-CM | POA: Diagnosis not present

## 2020-01-04 DIAGNOSIS — R600 Localized edema: Secondary | ICD-10-CM | POA: Diagnosis not present

## 2020-01-04 DIAGNOSIS — M6281 Muscle weakness (generalized): Secondary | ICD-10-CM | POA: Diagnosis not present

## 2020-01-04 NOTE — Therapy (Signed)
[facility] [address] [place], [place], [address] Phone: [phone]   Fax:  [phone]  Physical Therapy Evaluation  Patient Details  Name: Miranda Ross MRN: [id number] Date of Birth: [birth year] Referring Provider (PT): Lanae Crumbly, [place]   Encounter Date: 01/04/2020   PT End of Session - 01/04/20 1347    Visit Number 1    Number of Visits 12    Date for PT Re-Evaluation 02/15/20    PT Start Time 1300    PT Stop Time 1343    PT Time Calculation (min) 43 min    Activity Tolerance Patient tolerated treatment well    Behavior During Therapy [facility] for tasks assessed/performed           Past Medical History:  Diagnosis Date   Arthritis    Asthma    uses albuterol inh prn   Diabetes mellitus without complication ([place])    NIDDM   PONV (postoperative nausea and vomiting)    Raynaud's disease    Sleep apnea 2013   sleep study 2013 at [place], didnt finish study, left early    Past Surgical History:  Procedure Laterality Date   ACHILLES TENDON SURGERY Right 08/01/2019   Procedure: [place];  Surgeon: [name], MD;  Location: [place];  Service: Orthopedics;  Laterality: Right;   BACK SURGERY     cervical neck fusion 2002   FOOT SURGERY Left    topaz procedure   MASS EXCISION Left 09/02/2012   Procedure: MINOR EXCISION OF CYST LEFT RING FINGER A-3 PULLEY;  Surgeon: Cammie Sickle., MD;  Location: [place];  Service: Orthopedics;  Laterality: Left;   neck fusion  2002   OOPHORECTOMY     right hand surgery  2003   WISDOM TOOTH EXTRACTION     WRIST SURGERY Right    TFCC repair 2003    There were no vitals filed for this visit.    Subjective Assessment - 01/04/20 1302    Subjective Pt is a 44 y/o female who presents to OPPT s/p Rt achilles tendon repair on 08/01/2019.  Pt was out of work until 8/2 and returned part time but unable to continue due to swelling and  pain.  Pt had no formal PT following surgery.    Limitations Standing;Walking    How long can you stand comfortably? 20-30 min    Patient Stated Goals improve pain, swelling, strength    Currently in Pain? Yes    Pain Score 6    up to 8/10; at best 3/10   Pain Location Ankle    Pain Orientation Right    Pain Descriptors / Indicators Burning    Pain Type Acute pain;Surgical pain    Pain Onset More than a month ago    Pain Frequency Constant    Aggravating Factors  standing, walking    Pain Relieving Factors medication, elevation              [facility] PT Assessment - 01/04/20 1308      Assessment   Medical Diagnosis Z98.890 (ICD-10-CM) - S/P Achilles tendon repair    Referring Provider (PT) Lanae Crumbly, PA-C    Onset Date/Surgical Date 08/01/19    Hand Dominance Left    Next MD Visit 01/19/20    Prior Therapy no formal PT      Precautions   Precautions None      Restrictions   Weight Bearing Restrictions No  Balance Screen   Has the patient fallen in the past 6 months No    Has the patient had a decrease in activity level because of a fear of falling?  No    Is the patient reluctant to leave their home because of a fear of falling?  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; some heavy lifting    Leisure shopping; go to [place] to visit family, reading; walking      Cognition   Overall Cognitive Status Within Functional Limits for tasks assessed      Functional Tests   Functional tests Single leg stance      Single Leg Stance   Comments RLE: 3-5 sec; LLE: > 10 sec      ROM /  Strength   AROM / PROM / Strength AROM;PROM;Strength      AROM   AROM Assessment Site Ankle    Right/Left Ankle Right    Right Ankle Dorsiflexion -2    Right Ankle Plantar Flexion 28    Right Ankle Inversion 19    Right Ankle Eversion 15      PROM   PROM Assessment Site Ankle    Right/Left Ankle Right    Right Ankle Dorsiflexion 3    Right Ankle Plantar Flexion 33    Right Ankle Inversion 23    Right Ankle Eversion 25      Strength   Overall Strength Comments pain with all testing    Strength Assessment Site Ankle    Right/Left Ankle Right    Right Ankle Dorsiflexion 3/5    Right Ankle Plantar Flexion 1/5    Right Ankle Inversion 3/5    Right Ankle Eversion 3/5      Palpation   Palpation comment very tender to palpation along incision; increased swelling noted      Ambulation/Gait   Gait Comments decreased push off on Rt                      Objective measurements completed on examination: See above findings.       [facility] Adult PT Treatment/Exercise - 01/04/20 1308      Exercises   Exercises Other Exercises    Other Exercises  see pt instructions - pt performed 2-5 reps of each activity with cues for technique                  PT Education - 01/04/20 1347    Education Details HEP    Person(s) Educated Patient    Methods Explanation;Demonstration;Handout    Comprehension Verbalized understanding;Returned demonstration;Need further instruction            PT Short Term Goals - 01/04/20 1558      PT SHORT TERM GOAL #1   Title She will be independent with initial  HEP     Status New    Target Date 01/25/20      PT SHORT TERM GOAL #2   Title improve Rt ankle AROM by  at least 5 degrees all motions for improved function    Status New    Target Date 01/25/20             PT Long Term Goals - 01/04/20 1558      PT LONG TERM GOAL #1   Title She will be independent with all HEP issued as of last visit    Status New    Target Date  02/15/20      PT LONG TERM GOAL #2   Title report ability to work 4-8 hours with pain < 4/10 for improved function and mobility    Status New    Target Date 02/15/20      PT LONG TERM GOAL #3   Title demonstrate 3/5 plantarflexion strength for improved function    Status New    Target Date 02/15/20      PT LONG TERM GOAL #4   Title demonstrate improved strength and balance by performing RLE SLS > 10 sec    Status New    Target Date 02/15/20      PT LONG TERM GOAL #5   Title n/a                  Plan - 01/04/20 1350    Clinical Impression Statement Pt is a 44 y/o female who presents to OPPT s/p Rt achilles tendon repain on 08/01/19 with no formal PT post-op.  She presents today with continued pain and swelling, as well as decreased ROM and strength affecting functional mobility and return to work.  She will benefit from PT to address deficits listed.    Personal Factors and Comorbidities Age;Comorbidity 3+;Past/Current Experience;Fitness;Time since onset of injury/illness/exacerbation    Comorbidities arthritis, asthma, DM, obesity    Examination-Activity Limitations Lift;Stand;Locomotion Level;Transfers;Squat;Stairs;Carry    Examination-Participation Restrictions Community Activity;Occupation;Shop    Stability/Clinical Decision Making Evolving/Moderate complexity    Clinical Decision Making Moderate    Rehab Potential Good    PT Frequency 2x / week    PT Duration 6 weeks    PT Treatment/Interventions ADLs/Self Care Home Management;Cryotherapy;Electrical Stimulation;Ultrasound;Iontophoresis 4mg /ml Dexamethasone;Gait training;Stair training;Functional mobility training;Moist Heat;Therapeutic activities;Balance training;Neuromuscular re-education;Therapeutic exercise;Patient/family education;Scar mobilization;Manual techniques;Passive range of motion;Dry needling;Taping;Vasopneumatic Device    PT Next Visit Plan review HEP, work on balance/strengthening, manual/modalities PRN  for pain/ROM    PT Home Exercise Plan Access Code: [email or web address]    Consulted and Agree with Plan of Care Patient           Patient will benefit from skilled therapeutic intervention in order to improve the following deficits and impairments:  Abnormal gait, Increased fascial restricitons, Pain, Decreased scar mobility, Decreased mobility, Decreased activity tolerance, Decreased balance, Decreased range of motion, Decreased strength, Impaired flexibility, Increased edema, Difficulty walking  Visit Diagnosis: Pain in right ankle and joints of right foot  Muscle weakness (generalized)  Localized edema  Difficulty in walking, not elsewhere classified     Problem List Patient Active Problem List   Diagnosis Date Noted   Rupture of right Achilles tendon 08/01/2019   Achilles rupture, right 07/21/2019   Class 3 obesity with body mass index (BMI) of 50.0 to 59.9 in adult 01/29/2017   Polyclonal gammopathy determined by serum protein electrophoresis 08/21/2016   MGUS (monoclonal gammopathy of unknown significance) 08/18/2016   History of asthma 07/28/2016   Raynaud's disease without gangrene 07/24/2016   Primary osteoarthritis of both knees 07/24/2016   DJD (degenerative joint disease), cervical 07/24/2016   History of gastroesophageal reflux (GERD) 07/24/2016  ANA positive 07/24/2016   Trochanteric bursitis of both hips 07/24/2016   Hyperlipidemia 06/18/2013   Hematuria 06/18/2013      Laureen Abrahams, PT, DPT 01/04/20 4:01 PM     [facility] [address] [place], [place], [address] Phone: [phone]   Fax:  [phone]  Name: Miranda Ross MRN: [id number] Date of Birth: 10/09/1975

## 2020-01-04 NOTE — Patient Instructions (Signed)
Access Code: [email or web address] URL: [email or web address] Date: 01/04/2020 Prepared by: Faustino Congress  Exercises Seated Ankle Pumps on Table - 2 x daily - 7 x weekly - 1 sets - 15 reps Seated Ankle Circles - 2 x daily - 7 x weekly - 15 reps - 1 sets Seated Ankle Alphabet - 2 x daily - 7 x weekly - 1 sets - 1 a-z Heel rises with counter support - 1 x daily - 7 x weekly - 20 reps - 1 sets Standing Single Leg Stance with Unilateral Counter Support - 2 x daily - 7 x weekly - 3 reps - 1 sets - 10 sec hold Gastroc Stretch on Wall - 2 x daily - 7 x weekly - 3 reps - 1 sets - 30 sec hold

## 2020-01-13 ENCOUNTER — Telehealth: Payer: Self-pay | Admitting: *Deleted

## 2020-01-13 DIAGNOSIS — G894 Chronic pain syndrome: Secondary | ICD-10-CM

## 2020-01-13 NOTE — Telephone Encounter (Signed)
Patient advised Dr.Deveshwar would like to refer to to pain management and for them to take over her Tramadol. Patient is in agreement and referral placed.

## 2020-01-19 ENCOUNTER — Ambulatory Visit (INDEPENDENT_AMBULATORY_CARE_PROVIDER_SITE_OTHER): Payer: 59 | Admitting: Surgery

## 2020-01-19 DIAGNOSIS — M7751 Other enthesopathy of right foot: Secondary | ICD-10-CM

## 2020-01-19 DIAGNOSIS — Z9889 Other specified postprocedural states: Secondary | ICD-10-CM

## 2020-01-19 NOTE — Progress Notes (Signed)
Office Visit Note   Patient: Miranda Ross           Date of Birth: [birth year]           MRN: [id number] Visit Date: 01/19/2020              Requested by: Everardo Beals, NP [place],  [place] [address] PCP: Everardo Beals, NP   Assessment & Plan: Visit Diagnoses:  1. Retrocalcaneal bursitis (back of heel), right   2. S/P Achilles tendon repair     Plan:  Today I was planning on doing a right retrocalcaneal bursa Marcaine/double Medrol injection but patient's CBG in clinic 261.  I will have patient follow-up with me in 1 week and I will recheck her blood sugar at that time.  If it is under better control I will plan to do injection then.  She will continue home exercises and keep PT appointments as scheduled.  I extended her previous work note.   Follow-Up Instructions: Return in about 3 weeks (around 02/09/2020) for WITH DR Arcadia RIGHT ANKLE.   Orders:  No orders of the defined types were placed in this encounter.  No orders of the defined types were placed in this encounter.     Procedures: No procedures performed   Clinical Data: No additional findings.   Subjective: Chief Complaint  Patient presents with  . achilles post op    HPI 44 year old white female who is status post right Achilles tendon repair March 2021 returns.  She has gone to her initial PT eval appointment.  She has been trying to do home exercises and these continue to be painful.  She has upcoming scheduled PT appointments.  Review of Systems No current cardiac pulmonary GI GU issues  Objective: Vital Signs: There were no vitals taken for this visit.  Physical Exam Constitutional:      Appearance: She is obese.  HENT:     Head: Normocephalic.  Eyes:     Extraocular Movements: Extraocular movements intact.     Pupils: Pupils are equal, round, and reactive to light.  Pulmonary:     Effort: No respiratory distress.  Musculoskeletal:     Comments:  Patient has marked pre-Achilles bursa tenderness.  There is also  swelling in this area.  Achilles tendon intact.  Neurological:     Mental Status: She is alert and oriented to person, place, and time.     Ortho Exam  Specialty Comments:  No specialty comments available.  Imaging: No results found.   PMFS History: Patient Active Problem List   Diagnosis Date Noted  . Rupture of right Achilles tendon 08/01/2019  . Achilles rupture, right 07/21/2019  . Class 3 obesity with body mass index (BMI) of 50.0 to 59.9 in adult 01/29/2017  . Polyclonal gammopathy determined by serum protein electrophoresis 08/21/2016  . MGUS (monoclonal gammopathy of unknown significance) 08/18/2016  . History of asthma 07/28/2016  . Raynaud's disease without gangrene 07/24/2016  . Primary osteoarthritis of both knees 07/24/2016  . DJD (degenerative joint disease), cervical 07/24/2016  . History of gastroesophageal reflux (GERD) 07/24/2016  . ANA positive 07/24/2016  . Trochanteric bursitis of both hips 07/24/2016  . Hyperlipidemia 06/18/2013  . Hematuria 06/18/2013   Past Medical History:  Diagnosis Date  . Arthritis   . Asthma    uses albuterol inh prn  . Diabetes mellitus without complication (HCC)    NIDDM  . PONV (postoperative nausea and vomiting)   .  Raynaud's disease   . Sleep apnea 2013   sleep study 2013 at [facility], didnt finish study, left early    Family History  Problem Relation Age of Onset  . Diabetes Mother   . Heart disease Mother   . Depression Mother   . Diverticulosis Mother   . Non-Hodgkin's lymphoma Father   . Scleroderma Sister     Past Surgical History:  Procedure Laterality Date  . ACHILLES TENDON SURGERY Right 08/01/2019   Procedure: RIGHT REPAIR ACHILLES TENDON AVULSION;  Surgeon: [name], MD;  Location: [facility];  Service: Orthopedics;  Laterality: Right;  . BACK SURGERY     cervical neck fusion 2002  . FOOT SURGERY Left    topaz procedure  . MASS  EXCISION Left 09/02/2012   Procedure: MINOR EXCISION OF CYST LEFT RING FINGER A-3 PULLEY;  Surgeon: Cammie Sickle., MD;  Location: [facility];  Service: Orthopedics;  Laterality: Left;  . neck fusion  2002  . OOPHORECTOMY    . right hand surgery  2003  . WISDOM TOOTH EXTRACTION    . WRIST SURGERY Right    TFCC repair 2003   Social History   Occupational History  . Not on file  Tobacco Use  . Smoking status: Never Smoker  . Smokeless tobacco: Never Used  Vaping Use  . Vaping Use: Never used  Substance and Sexual Activity  . Alcohol use: No  . Drug use: No  . Sexual activity: Yes    Birth control/protection: I.U.D.    Comment: Mirena IUD placed 2016

## 2020-01-23 ENCOUNTER — Encounter: Payer: 59 | Admitting: Physical Therapy

## 2020-01-23 ENCOUNTER — Telehealth: Payer: Self-pay | Admitting: Physical Therapy

## 2020-01-23 NOTE — Telephone Encounter (Signed)
Spoke to pt who states she did not realize she had PT appt scheduled for today.  She is aware of next scheduled appt and plans to be there.  Laureen Abrahams, PT, DPT 01/23/20 2:06 PM

## 2020-01-24 ENCOUNTER — Other Ambulatory Visit: Payer: Self-pay

## 2020-01-24 ENCOUNTER — Ambulatory Visit (INDEPENDENT_AMBULATORY_CARE_PROVIDER_SITE_OTHER): Payer: 59 | Admitting: Physical Therapy

## 2020-01-24 ENCOUNTER — Encounter: Payer: Self-pay | Admitting: Physical Medicine and Rehabilitation

## 2020-01-24 DIAGNOSIS — M6281 Muscle weakness (generalized): Secondary | ICD-10-CM | POA: Diagnosis not present

## 2020-01-24 DIAGNOSIS — M25571 Pain in right ankle and joints of right foot: Secondary | ICD-10-CM

## 2020-01-24 DIAGNOSIS — R262 Difficulty in walking, not elsewhere classified: Secondary | ICD-10-CM | POA: Diagnosis not present

## 2020-01-24 DIAGNOSIS — R600 Localized edema: Secondary | ICD-10-CM | POA: Diagnosis not present

## 2020-01-24 NOTE — Therapy (Signed)
[facility] Physical Therapy [address] [place], [place], [address] Phone: [phone]   Fax:  [phone]  Physical Therapy Treatment  Patient Details  Name: Miranda Ross MRN: [id number] Date of Birth: 02-Jan-1976 Referring Provider (PT): Lanae Crumbly, [place]   Encounter Date: 01/24/2020   PT End of Session - 01/24/20 0955    Visit Number 2    Number of Visits 12    Date for PT Re-Evaluation 02/15/20    PT Start Time 0922    PT Stop Time 1005    PT Time Calculation (min) 43 min    Activity Tolerance Patient tolerated treatment well    Behavior During Therapy [facility] for tasks assessed/performed           Past Medical History:  Diagnosis Date  . Arthritis   . Asthma    uses albuterol inh prn  . Diabetes mellitus without complication (HCC)    NIDDM  . PONV (postoperative nausea and vomiting)   . Raynaud's disease   . Sleep apnea 2013   sleep study 2013 at [facility], didnt finish study, left early    Past Surgical History:  Procedure Laterality Date  . ACHILLES TENDON SURGERY Right 08/01/2019   Procedure: RIGHT REPAIR ACHILLES TENDON AVULSION;  Surgeon: [name], MD;  Location: [place];  Service: Orthopedics;  Laterality: Right;  . BACK SURGERY     cervical neck fusion 2002  . FOOT SURGERY Left    topaz procedure  . MASS EXCISION Left 09/02/2012   Procedure: MINOR EXCISION OF CYST LEFT RING FINGER A-3 PULLEY;  Surgeon: Cammie Sickle., MD;  Location: [facility];  Service: Orthopedics;  Laterality: Left;  . neck fusion  2002  . OOPHORECTOMY    . right hand surgery  2003  . WISDOM TOOTH EXTRACTION    . WRIST SURGERY Right    TFCC repair 2003    There were no vitals filed for this visit.   Subjective Assessment - 01/24/20 0927    Subjective relays Rt ankle hurts alot today and is already swollen and she hasnt done anything yet today. Relays 8/10 pain    Limitations Standing;Walking    How long can you stand comfortably?  20-30 min    Patient Stated Goals improve pain, swelling, strength    Pain Orientation Right    Pain Onset More than a month ago                [facility] Adult PT Treatment/Exercise - 01/24/20 0001      Exercises   Exercises Ankle      Modalities   Modalities Vasopneumatic      Vasopneumatic   Number Minutes Vasopneumatic  10 minutes    Vasopnuematic Location  Ankle    Vasopneumatic Pressure Medium    Vasopneumatic Temperature  34      Manual Therapy   Manual therapy comments KT tape for edema (2 stips basket weave) control and to reduce strain on achillies (one I strip vetical and one I stip horizontal)      Ankle Exercises: Stretches   Soleus Stretch 3 reps;30 seconds    Soleus Stretch Limitations slantboard    Gastroc Stretch 3 reps;30 seconds    Gastroc Stretch Limitations slantboard      Ankle Exercises: Aerobic   Nustep 6 min L3      Ankle Exercises: Standing   Other Standing Ankle Exercises tandem balance 30 sec X 2 bilat    Other  Standing Ankle Exercises Heel toe raises with UE support X15 ea      Ankle Exercises: Seated   Other Seated Ankle Exercises rocker board 2 min lateral, 2 min A-P    Other Seated Ankle Exercises ankle PF with green 3X10                    PT Short Term Goals - 01/04/20 1558      PT SHORT TERM GOAL #1   Title She will be independent with initial  HEP     Status New    Target Date 01/25/20      PT SHORT TERM GOAL #2   Title improve Rt ankle AROM by at least 5 degrees all motions for improved function    Status New    Target Date 01/25/20             PT Long Term Goals - 01/04/20 1558      PT LONG TERM GOAL #1   Title She will be independent with all HEP issued as of last visit    Status New    Target Date 02/15/20      PT LONG TERM GOAL #2   Title report ability to work 4-8 hours with pain < 4/10 for improved function and mobility    Status New    Target Date 02/15/20      PT LONG TERM GOAL #3   Title  demonstrate 3/5 plantarflexion strength for improved function    Status New    Target Date 02/15/20      PT LONG TERM GOAL #4   Title demonstrate improved strength and balance by performing RLE SLS > 10 sec    Status New    Target Date 02/15/20      PT LONG TERM GOAL #5   Title n/a                 Plan - 01/24/20 0956    Clinical Impression Statement Session focused on ROM stretching, and gentle strethening as tolerated for her Rt achillies/ankle. Due to moderate edema noted used vasopneumatic and KT tape to reduce overal edema.    Personal Factors and Comorbidities Age;Comorbidity 3+;Past/Current Experience;Fitness;Time since onset of injury/illness/exacerbation    Comorbidities arthritis, asthma, DM, obesity    Examination-Activity Limitations Lift;Stand;Locomotion Level;Transfers;Squat;Stairs;Carry    Examination-Participation Restrictions Community Activity;Occupation;Shop    Stability/Clinical Decision Making Evolving/Moderate complexity    Rehab Potential Good    PT Frequency 2x / week    PT Duration 6 weeks    PT Treatment/Interventions ADLs/Self Care Home Management;Cryotherapy;Electrical Stimulation;Ultrasound;Iontophoresis 4mg /ml Dexamethasone;Gait training;Stair training;Functional mobility training;Moist Heat;Therapeutic activities;Balance training;Neuromuscular re-education;Therapeutic exercise;Patient/family education;Scar mobilization;Manual techniques;Passive range of motion;Dry needling;Taping;Vasopneumatic Device    PT Next Visit Plan review HEP, work on balance/strengthening, manual/modalities PRN for pain/ROM    PT Home Exercise Plan Access Code: [email or web address]    Consulted and Agree with Plan of Care Patient           Patient will benefit from skilled therapeutic intervention in order to improve the following deficits and impairments:  Abnormal gait, Increased fascial restricitons, Pain, Decreased scar mobility, Decreased mobility, Decreased activity tolerance,  Decreased balance, Decreased range of motion, Decreased strength, Impaired flexibility, Increased edema, Difficulty walking  Visit Diagnosis: Pain in right ankle and joints of right foot  Muscle weakness (generalized)  Localized edema  Difficulty in walking, not elsewhere classified     Problem List Patient Active Problem List   Diagnosis Date Noted  .  Rupture of right Achilles tendon 08/01/2019  . Achilles rupture, right 07/21/2019  . Class 3 obesity with body mass index (BMI) of 50.0 to 59.9 in adult 01/29/2017  . Polyclonal gammopathy determined by serum protein electrophoresis 08/21/2016  . MGUS (monoclonal gammopathy of unknown significance) 08/18/2016  . History of asthma 07/28/2016  . Raynaud's disease without gangrene 07/24/2016  . Primary osteoarthritis of both knees 07/24/2016  . DJD (degenerative joint disease), cervical 07/24/2016  . History of gastroesophageal reflux (GERD) 07/24/2016  . ANA positive 07/24/2016  . Trochanteric bursitis of both hips 07/24/2016  . Hyperlipidemia 06/18/2013  . Hematuria 06/18/2013    Silvestre Mesi 01/24/2020, 10:10 AM  [facility] Physical Therapy [address] [place], [place], [address] Phone: [phone]   Fax:  [phone]  Name: Miranda Ross MRN: [id number] Date of Birth: 09-Oct-1975

## 2020-01-26 ENCOUNTER — Encounter: Payer: Self-pay | Admitting: Surgery

## 2020-01-26 ENCOUNTER — Ambulatory Visit (INDEPENDENT_AMBULATORY_CARE_PROVIDER_SITE_OTHER): Payer: 59 | Admitting: Surgery

## 2020-01-26 ENCOUNTER — Ambulatory Visit: Payer: 59 | Admitting: Orthopaedic Surgery

## 2020-01-26 ENCOUNTER — Telehealth: Payer: Self-pay | Admitting: Rheumatology

## 2020-01-26 ENCOUNTER — Other Ambulatory Visit: Payer: Self-pay | Admitting: Rheumatology

## 2020-01-26 DIAGNOSIS — Z9889 Other specified postprocedural states: Secondary | ICD-10-CM | POA: Diagnosis not present

## 2020-01-26 DIAGNOSIS — M7751 Other enthesopathy of right foot: Secondary | ICD-10-CM

## 2020-01-26 MED ORDER — BUPIVACAINE HCL 0.5 % IJ SOLN
1.0000 mL | INTRAMUSCULAR | Status: AC | PRN
  Administered 2020-01-26: 1 mL via INTRA_ARTICULAR

## 2020-01-26 MED ORDER — METHYLPREDNISOLONE ACETATE 40 MG/ML IJ SUSP
40.0000 mg | INTRAMUSCULAR | Status: AC | PRN
  Administered 2020-01-26: 40 mg via INTRA_ARTICULAR

## 2020-01-26 MED ORDER — TRAMADOL HCL 50 MG PO TABS
50.0000 mg | ORAL_TABLET | Freq: Two times a day (BID) | ORAL | 0 refills | Status: DC | PRN
Start: 2020-01-26 — End: 2020-03-01

## 2020-01-26 NOTE — Progress Notes (Signed)
Office Visit Note   Patient: Miranda Ross           Date of Birth: Jan 20, 1976           MRN: [id number] Visit Date: 01/26/2020              Requested by: Everardo Beals, NP [place],  [place] [address] PCP: Everardo Beals, NP   Assessment & Plan: Visit Diagnoses:  1. Retrocalcaneal bursitis (back of heel), right   2. S/P Achilles tendon repair     Plan: Today CBG in clinic 151.  Elected to proceed with retrocalcaneal bursa Marcaine/Depo-Medrol injection.  After patient consent this was performed.  She will continue formal PT.  I did let Faustino Congress physical therapist know how patient responded to today's injection.  Hopefully this will help on the therapist end.  Patient will follow-up with Dr. Lorin Mercy in 3 weeks for recheck.  All questions answered.  Follow-Up Instructions: Return in about 3 weeks (around 02/16/2020) for With Dr. Lorin Mercy.   Orders:  Orders Placed This Encounter  Procedures  . Foot Inj  . Small Joint Inj  . Foot Inj  . Medium Joint Inj   No orders of the defined types were placed in this encounter.     Procedures: Medium Joint Inj: R ankle on 01/26/2020 11:29 AM Indications: pain Details: 25 G 1.5 in needle, lateral approach Medications: 1 mL bupivacaine 0.5 %; 40 mg methylPREDNISolone acetate 40 MG/ML Outcome: tolerated well, no immediate complications  After patient consent right lateral heel was prepped with Betadine and retrocalcaneal bursa Marcaine/Depo-Medrol injection was performed.  After sitting for a couple of minutes patient reported very good relief of her bursa pain in the posterior heel with anesthetic in place.  Patient also had improvement of her plantar flexion and dorsiflexion with much improvement of her pain. Consent was given by the patient. Patient was prepped and draped in the usual sterile fashion.       Clinical Data: No additional findings.   Subjective: Chief Complaint  Patient presents with   . Right Foot - Follow-up    HPI 44 year old white female returns for recheck of her blood sugar and to have right retrocalcaneal bursa Marcaine/Depo-Medrol injection.  She continues have a considerable amount of pain posterior heel.  She is working with physical therapy.   Objective: Vital Signs: There were no vitals taken for this visit.  Physical Exam Gait is antalgic.  Again she has limited range of motion with ankle plantarflexion and dorsiflexion.  Marked tenderness over the retrocalcaneal bursa.  She also does have swelling in this area.  No signs of infection. Ortho Exam  Specialty Comments:  No specialty comments available.  Imaging: No results found.   PMFS History: Patient Active Problem List   Diagnosis Date Noted  . Rupture of right Achilles tendon 08/01/2019  . Achilles rupture, right 07/21/2019  . Class 3 obesity with body mass index (BMI) of 50.0 to 59.9 in adult 01/29/2017  . Polyclonal gammopathy determined by serum protein electrophoresis 08/21/2016  . MGUS (monoclonal gammopathy of unknown significance) 08/18/2016  . History of asthma 07/28/2016  . Raynaud's disease without gangrene 07/24/2016  . Primary osteoarthritis of both knees 07/24/2016  . DJD (degenerative joint disease), cervical 07/24/2016  . History of gastroesophageal reflux (GERD) 07/24/2016  . ANA positive 07/24/2016  . Trochanteric bursitis of both hips 07/24/2016  . Hyperlipidemia 06/18/2013  . Hematuria 06/18/2013   Past Medical History:  Diagnosis Date  .  Arthritis   . Asthma    uses albuterol inh prn  . Diabetes mellitus without complication (HCC)    NIDDM  . PONV (postoperative nausea and vomiting)   . Raynaud's disease   . Sleep apnea 2013   sleep study 2013 at [place] [facility], didnt finish study, left early    Family History  Problem Relation Age of Onset  . Diabetes Mother   . Heart disease Mother   . Depression Mother   . Diverticulosis Mother   . Non-Hodgkin's lymphoma  Father   . Scleroderma Sister     Past Surgical History:  Procedure Laterality Date  . ACHILLES TENDON SURGERY Right 08/01/2019   Procedure: RIGHT REPAIR ACHILLES TENDON AVULSION;  Surgeon: [name], MD;  Location: [place];  Service: Orthopedics;  Laterality: Right;  . BACK SURGERY     cervical neck fusion 2002  . FOOT SURGERY Left    topaz procedure  . MASS EXCISION Left 09/02/2012   Procedure: MINOR EXCISION OF CYST LEFT RING FINGER A-3 PULLEY;  Surgeon: Cammie Sickle., MD;  Location: [place];  Service: Orthopedics;  Laterality: Left;  . neck fusion  2002  . OOPHORECTOMY    . right hand surgery  2003  . WISDOM TOOTH EXTRACTION    . WRIST SURGERY Right    TFCC repair 2003   Social History   Occupational History  . Not on file  Tobacco Use  . Smoking status: Never Smoker  . Smokeless tobacco: Never Used  Vaping Use  . Vaping Use: Never used  Substance and Sexual Activity  . Alcohol use: No  . Drug use: No  . Sexual activity: Yes    Birth control/protection: I.U.D.    Comment: Mirena IUD placed 2016

## 2020-01-26 NOTE — Telephone Encounter (Signed)
Last Visit: 10/17/2019 Next Visit: 04/16/2020 UDS: 10/19/2019 UDS is consistent with treatment.  Narc Agreement: 10/17/2019  Last Fill: 11/15/19   Okay to refill Tramadol?  Patient referred to pain management, appointment scheduled for end of October 2021. Patient is asking for a refill to bridge her to her appointment with pain management.

## 2020-01-26 NOTE — Telephone Encounter (Signed)
Patient called requesting a prescription refill of Tramadol.  Patient also requested a return call to discuss her referral to pain management.

## 2020-01-31 ENCOUNTER — Other Ambulatory Visit: Payer: Self-pay

## 2020-01-31 ENCOUNTER — Ambulatory Visit (INDEPENDENT_AMBULATORY_CARE_PROVIDER_SITE_OTHER): Payer: 59 | Admitting: Physical Therapy

## 2020-01-31 ENCOUNTER — Encounter: Payer: Self-pay | Admitting: Physical Therapy

## 2020-01-31 DIAGNOSIS — M6281 Muscle weakness (generalized): Secondary | ICD-10-CM

## 2020-01-31 DIAGNOSIS — M25571 Pain in right ankle and joints of right foot: Secondary | ICD-10-CM | POA: Diagnosis not present

## 2020-01-31 DIAGNOSIS — R262 Difficulty in walking, not elsewhere classified: Secondary | ICD-10-CM

## 2020-01-31 DIAGNOSIS — R600 Localized edema: Secondary | ICD-10-CM | POA: Diagnosis not present

## 2020-01-31 NOTE — Therapy (Signed)
[facility] Therapy [address] [place], [place], [address] Phone: [phone]   Fax:  [phone]  Physical Therapy Treatment  Patient Details  Name: Miranda Ross MRN: [id number] Date of Birth: Jan 03, 1976 Referring Provider (PT): Lanae Crumbly, [place]   Encounter Date: 01/31/2020   PT End of Session - 01/31/20 1004    Visit Number 3    Number of Visits 12    Date for PT Re-Evaluation 02/15/20    PT Start Time 0920    PT Stop Time 1017    PT Time Calculation (min) 57 min    Activity Tolerance Patient tolerated treatment well    Behavior During Therapy [facility] for tasks assessed/performed           Past Medical History:  Diagnosis Date  . Arthritis   . Asthma    uses albuterol inh prn  . Diabetes mellitus without complication (HCC)    NIDDM  . PONV (postoperative nausea and vomiting)   . Raynaud's disease   . Sleep apnea 2013   sleep study 2013 at [place], didnt finish study, left early    Past Surgical History:  Procedure Laterality Date  . ACHILLES TENDON SURGERY Right 08/01/2019   Procedure: RIGHT REPAIR ACHILLES TENDON AVULSION;  Surgeon: [name], MD;  Location: [place];  Service: Orthopedics;  Laterality: Right;  . BACK SURGERY     cervical neck fusion 2002  . FOOT SURGERY Left    topaz procedure  . MASS EXCISION Left 09/02/2012   Procedure: MINOR EXCISION OF CYST LEFT RING FINGER A-3 PULLEY;  Surgeon: Cammie Sickle., MD;  Location: [place];  Service: Orthopedics;  Laterality: Left;  . neck fusion  2002  . OOPHORECTOMY    . right hand surgery  2003  . WISDOM TOOTH EXTRACTION    . WRIST SURGERY Right    TFCC repair 2003    There were no vitals filed for this visit.   Subjective Assessment - 01/31/20 0925    Subjective pain is a little better since injection - feels like pain isn't as intense    Limitations Standing;Walking    How long can you stand comfortably? 20-30 min    Patient Stated Goals  improve pain, swelling, strength    Currently in Pain? Yes    Pain Score 5     Pain Location Ankle    Pain Orientation Right    Pain Descriptors / Indicators Burning    Pain Type Acute pain;Surgical pain    Pain Onset More than a month ago    Pain Frequency Constant    Aggravating Factors  standing, walking    Pain Relieving Factors medication, elevation                             [facility] Adult PT Treatment/Exercise - 01/31/20 0926      Vasopneumatic   Number Minutes Vasopneumatic  10 minutes    Vasopnuematic Location  Ankle    Vasopneumatic Pressure Medium    Vasopneumatic Temperature  34      Manual Therapy   Manual therapy comments KT tape for edema (2 stips basket weave) control and to reduce strain on achillies (one I strip vetical and one I stip horizontal)      Ankle Exercises: Aerobic   Nustep L4 x 8 min      Ankle Exercises: Stretches   Soleus Stretch 3 reps;30 seconds  Soleus Stretch Limitations slantboard    Gastroc Stretch 3 reps;30 seconds    Gastroc Stretch Limitations slantboard    Other Stretch seated dorsiflexion stretch 3x 20 sec      Ankle Exercises: Standing   SLS on foam 5x10 sec hold; RLE with intermittent UE support    Heel Raises --   3x10 on slant board   Other Standing Ankle Exercises step ups onto foam forward/lateral 2x10 bil                    PT Short Term Goals - 01/31/20 1005      PT SHORT TERM GOAL #1   Title She will be independent with initial  HEP     Status Achieved    Target Date 01/25/20      PT SHORT TERM GOAL #2   Title improve Rt ankle AROM by at least 5 degrees all motions for improved function    Baseline improving, not measured    Status On-going    Target Date 01/25/20             PT Long Term Goals - 01/31/20 1005      PT LONG TERM GOAL #1   Title She will be independent with all HEP issued as of last visit    Status On-going    Target Date 02/15/20      PT LONG TERM GOAL #2     Title report ability to work 4-8 hours with pain < 4/10 for improved function and mobility    Status On-going    Target Date 02/15/20      PT LONG TERM GOAL #3   Title demonstrate 3/5 plantarflexion strength for improved function    Status On-going    Target Date 02/15/20      PT LONG TERM GOAL #4   Title demonstrate improved strength and balance by performing RLE SLS > 10 sec    Status On-going    Target Date 02/15/20      PT LONG TERM GOAL #5   Title n/a                 Plan - 01/31/20 1006    Clinical Impression Statement Pt tolerated session well today reporting improved symptoms following injection last week.  Will continue to benefit from PT to maximize function.    Personal Factors and Comorbidities Age;Comorbidity 3+;Past/Current Experience;Fitness;Time since onset of injury/illness/exacerbation    Comorbidities arthritis, asthma, DM, obesity    Examination-Activity Limitations Lift;Stand;Locomotion Level;Transfers;Squat;Stairs;Carry    Examination-Participation Restrictions Community Activity;Occupation;Shop    Stability/Clinical Decision Making Evolving/Moderate complexity    Rehab Potential Good    PT Frequency 2x / week    PT Duration 6 weeks    PT Treatment/Interventions ADLs/Self Care Home Management;Cryotherapy;Electrical Stimulation;Ultrasound;Iontophoresis 4mg /ml Dexamethasone;Gait training;Stair training;Functional mobility training;Moist Heat;Therapeutic activities;Balance training;Neuromuscular re-education;Therapeutic exercise;Patient/family education;Scar mobilization;Manual techniques;Passive range of motion;Dry needling;Taping;Vasopneumatic Device    PT Next Visit Plan review HEP, work on balance/strengthening, manual/modalities PRN for pain/ROM    PT Home Exercise Plan Access Code: [email or web address]    Consulted and Agree with Plan of Care Patient           Patient will benefit from skilled therapeutic intervention in order to improve the following  deficits and impairments:  Abnormal gait, Increased fascial restricitons, Pain, Decreased scar mobility, Decreased mobility, Decreased activity tolerance, Decreased balance, Decreased range of motion, Decreased strength, Impaired flexibility, Increased edema, Difficulty walking  Visit Diagnosis: Pain in right ankle and  joints of right foot  Muscle weakness (generalized)  Localized edema  Difficulty in walking, not elsewhere classified     Problem List Patient Active Problem List   Diagnosis Date Noted  . Rupture of right Achilles tendon 08/01/2019  . Achilles rupture, right 07/21/2019  . Class 3 obesity with body mass index (BMI) of 50.0 to 59.9 in adult 01/29/2017  . Polyclonal gammopathy determined by serum protein electrophoresis 08/21/2016  . MGUS (monoclonal gammopathy of unknown significance) 08/18/2016  . History of asthma 07/28/2016  . Raynaud's disease without gangrene 07/24/2016  . Primary osteoarthritis of both knees 07/24/2016  . DJD (degenerative joint disease), cervical 07/24/2016  . History of gastroesophageal reflux (GERD) 07/24/2016  . ANA positive 07/24/2016  . Trochanteric bursitis of both hips 07/24/2016  . Hyperlipidemia 06/18/2013  . Hematuria 06/18/2013      Laureen Abrahams, PT, DPT 01/31/20 1:01 PM     [facility] [address] [place], [place], [address] Phone: [phone]   Fax:  [phone]  Name: Miranda Ross MRN: [id number] Date of Birth: Sep 27, 1975

## 2020-02-02 ENCOUNTER — Ambulatory Visit (INDEPENDENT_AMBULATORY_CARE_PROVIDER_SITE_OTHER): Payer: 59 | Admitting: Physical Therapy

## 2020-02-02 ENCOUNTER — Encounter: Payer: Self-pay | Admitting: Orthopaedic Surgery

## 2020-02-02 ENCOUNTER — Other Ambulatory Visit: Payer: Self-pay

## 2020-02-02 ENCOUNTER — Encounter: Payer: Self-pay | Admitting: Physical Therapy

## 2020-02-02 DIAGNOSIS — M25571 Pain in right ankle and joints of right foot: Secondary | ICD-10-CM | POA: Diagnosis not present

## 2020-02-02 DIAGNOSIS — R600 Localized edema: Secondary | ICD-10-CM | POA: Diagnosis not present

## 2020-02-02 DIAGNOSIS — M6281 Muscle weakness (generalized): Secondary | ICD-10-CM | POA: Diagnosis not present

## 2020-02-02 DIAGNOSIS — R262 Difficulty in walking, not elsewhere classified: Secondary | ICD-10-CM | POA: Diagnosis not present

## 2020-02-02 NOTE — Patient Instructions (Signed)
Access Code: [email or web address] URL: [email or web address] Date: 02/02/2020 Prepared by: Faustino Congress  Exercises Seated Ankle Pumps on Table - 2 x daily - 7 x weekly - 1 sets - 15 reps Seated Ankle Circles - 2 x daily - 7 x weekly - 15 reps - 1 sets Seated Ankle Alphabet - 2 x daily - 7 x weekly - 1 sets - 1 a-z Heel rises with counter support - 1 x daily - 7 x weekly - 20 reps - 1 sets Standing Single Leg Stance with Unilateral Counter Support - 2 x daily - 7 x weekly - 3 reps - 1 sets - 10 sec hold Gastroc Stretch on Wall - 2 x daily - 7 x weekly - 3 reps - 1 sets - 30 sec hold Seated Ankle Plantarflexion Dorsiflexion PROM - 2 x daily - 7 x weekly - 1 sets - 10 reps Seated Arch Lifts - 2 x daily - 7 x weekly - 1 sets - 10 reps - 5 sec hold

## 2020-02-02 NOTE — Therapy (Signed)
[facility] Physical Therapy [address] [place], [place], [address] Phone: [phone]   Fax:  [phone]  Physical Therapy Treatment  Patient Details  Name: Miranda Ross MRN: [id number] Date of Birth: 01-04-1976 Referring Provider (PT): Lanae Crumbly, [place]   Encounter Date: 02/02/2020   PT End of Session - 02/02/20 1010    Visit Number 4    Number of Visits 12    Date for PT Re-Evaluation 02/15/20    PT Start Time 0925    PT Stop Time 1015    PT Time Calculation (min) 50 min    Activity Tolerance Patient tolerated treatment well    Behavior During Therapy [facility] for tasks assessed/performed           Past Medical History:  Diagnosis Date  . Arthritis   . Asthma    uses albuterol inh prn  . Diabetes mellitus without complication (HCC)    NIDDM  . PONV (postoperative nausea and vomiting)   . Raynaud's disease   . Sleep apnea 2013   sleep study 2013 at [place], didnt finish study, left early    Past Surgical History:  Procedure Laterality Date  . ACHILLES TENDON SURGERY Right 08/01/2019   Procedure: RIGHT REPAIR ACHILLES TENDON AVULSION;  Surgeon: [name], MD;  Location: [place];  Service: Orthopedics;  Laterality: Right;  . BACK SURGERY     cervical neck fusion 2002  . FOOT SURGERY Left    topaz procedure  . MASS EXCISION Left 09/02/2012   Procedure: MINOR EXCISION OF CYST LEFT RING FINGER A-3 PULLEY;  Surgeon: Cammie Sickle., MD;  Location: [place];  Service: Orthopedics;  Laterality: Left;  . neck fusion  2002  . OOPHORECTOMY    . right hand surgery  2003  . WISDOM TOOTH EXTRACTION    . WRIST SURGERY Right    TFCC repair 2003    There were no vitals filed for this visit.   Subjective Assessment - 02/02/20 0924    Subjective calves were very sore after last session - Lt calf was more sore than Rt.    Limitations Standing;Walking    How long can you stand comfortably? 20-30 min    Patient Stated Goals improve  pain, swelling, strength    Currently in Pain? Yes    Pain Score 6     Pain Location Ankle    Pain Orientation Right    Pain Descriptors / Indicators Aching    Pain Type Acute pain;Surgical pain    Pain Onset More than a month ago    Pain Frequency Constant    Aggravating Factors  standing, walking    Pain Relieving Factors meds, elevation              [facility] PT Assessment - 02/02/20 0953      Assessment   Medical Diagnosis Z98.890 (ICD-10-CM) - S/P Achilles tendon repair    Referring Provider (PT) Lanae Crumbly, PA-C    Onset Date/Surgical Date 08/01/19    Hand Dominance Left    Next MD Visit 02/14/20      AROM   Right Ankle Dorsiflexion 8    Right Ankle Plantar Flexion 29    Right Ankle Inversion 23    Right Ankle Eversion 20                         [facility] Adult PT Treatment/Exercise - 02/02/20 1287  Vasopneumatic   Number Minutes Vasopneumatic  10 minutes    Vasopnuematic Location  Ankle    Vasopneumatic Pressure Medium    Vasopneumatic Temperature  34      Ankle Exercises: Aerobic   Recumbent Bike L3 x 8 min      Ankle Exercises: Stretches   Soleus Stretch 3 reps;30 seconds    Soleus Stretch Limitations slantboard    Gastroc Stretch 3 reps;30 seconds    Gastroc Stretch Limitations slantboard      Ankle Exercises: Standing   Rocker Board 5 minutes   ant/post and lateral   Tai Chi crossover onto aerobic step - RLE crossover 2x10      Ankle Exercises: Seated   Other Seated Ankle Exercises Rt ankle PROM - pt performed    Other Seated Ankle Exercises seated arch lifts 5 x 5 sec hold                  PT Education - 02/02/20 1009    Education Details added to HEP    Person(s) Educated Patient    Methods Explanation;Demonstration;Handout    Comprehension Verbalized understanding;Returned demonstration            PT Short Term Goals - 02/02/20 1010      PT SHORT TERM GOAL #1   Title She will be independent with initial  HEP      Status Achieved    Target Date 01/25/20      PT SHORT TERM GOAL #2   Title improve Rt ankle AROM by at least 5 degrees all motions for improved function    Status Achieved    Target Date 01/25/20             PT Long Term Goals - 01/31/20 1005      PT LONG TERM GOAL #1   Title She will be independent with all HEP issued as of last visit    Status On-going    Target Date 02/15/20      PT LONG TERM GOAL #2   Title report ability to work 4-8 hours with pain < 4/10 for improved function and mobility    Status On-going    Target Date 02/15/20      PT LONG TERM GOAL #3   Title demonstrate 3/5 plantarflexion strength for improved function    Status On-going    Target Date 02/15/20      PT LONG TERM GOAL #4   Title demonstrate improved strength and balance by performing RLE SLS > 10 sec    Status On-going    Target Date 02/15/20      PT LONG TERM GOAL #5   Title n/a                 Plan - 02/02/20 1010    Clinical Impression Statement Pt demonstrated improvement in active DF today with slight improvement in other Rt ankle motions.  Progressing well with PT, and will continue to benefit from PT to maximize function.    Personal Factors and Comorbidities Age;Comorbidity 3+;Past/Current Experience;Fitness;Time since onset of injury/illness/exacerbation    Comorbidities arthritis, asthma, DM, obesity    Examination-Activity Limitations Lift;Stand;Locomotion Level;Transfers;Squat;Stairs;Carry    Examination-Participation Restrictions Community Activity;Occupation;Shop    Stability/Clinical Decision Making Evolving/Moderate complexity    Rehab Potential Good    PT Frequency 2x / week    PT Duration 6 weeks    PT Treatment/Interventions ADLs/Self Care Home Management;Cryotherapy;Electrical Stimulation;Ultrasound;Iontophoresis 11m/ml Dexamethasone;Gait training;Stair training;Functional mobility training;Moist Heat;Therapeutic activities;Balance  training;Neuromuscular  re-education;Therapeutic exercise;Patient/family education;Scar mobilization;Manual techniques;Passive range of motion;Dry needling;Taping;Vasopneumatic Device    PT Next Visit Plan review HEP, work on balance/strengthening, manual/modalities PRN for pain/ROM    PT Home Exercise Plan Access Code: [email or web address]    Consulted and Agree with Plan of Care Patient           Patient will benefit from skilled therapeutic intervention in order to improve the following deficits and impairments:  Abnormal gait, Increased fascial restricitons, Pain, Decreased scar mobility, Decreased mobility, Decreased activity tolerance, Decreased balance, Decreased range of motion, Decreased strength, Impaired flexibility, Increased edema, Difficulty walking  Visit Diagnosis: Pain in right ankle and joints of right foot  Muscle weakness (generalized)  Localized edema  Difficulty in walking, not elsewhere classified     Problem List Patient Active Problem List   Diagnosis Date Noted  . Rupture of right Achilles tendon 08/01/2019  . Achilles rupture, right 07/21/2019  . Class 3 obesity with body mass index (BMI) of 50.0 to 59.9 in adult 01/29/2017  . Polyclonal gammopathy determined by serum protein electrophoresis 08/21/2016  . MGUS (monoclonal gammopathy of unknown significance) 08/18/2016  . History of asthma 07/28/2016  . Raynaud's disease without gangrene 07/24/2016  . Primary osteoarthritis of both knees 07/24/2016  . DJD (degenerative joint disease), cervical 07/24/2016  . History of gastroesophageal reflux (GERD) 07/24/2016  . ANA positive 07/24/2016  . Trochanteric bursitis of both hips 07/24/2016  . Hyperlipidemia 06/18/2013  . Hematuria 06/18/2013     Laureen Abrahams, PT, DPT 02/02/20 10:12 AM     [facility] [address] [place], [place], [address] Phone: [phone]   Fax:  [phone]  Name: ELLYSA PARRACK MRN: [id number] Date of Birth:  May 17, 1975

## 2020-02-07 ENCOUNTER — Telehealth: Payer: Self-pay

## 2020-02-07 ENCOUNTER — Encounter: Payer: 59 | Admitting: Physical Therapy

## 2020-02-07 NOTE — Telephone Encounter (Signed)
Patient called stating she is waiting to receive a call back regarding paperwork for disability. Call back: [phone]

## 2020-02-07 NOTE — Telephone Encounter (Signed)
Patient had also sent a mychart message. I responded to her mychart message.

## 2020-02-09 ENCOUNTER — Other Ambulatory Visit: Payer: Self-pay

## 2020-02-09 ENCOUNTER — Ambulatory Visit (INDEPENDENT_AMBULATORY_CARE_PROVIDER_SITE_OTHER): Payer: 59 | Admitting: Physical Therapy

## 2020-02-09 ENCOUNTER — Encounter: Payer: Self-pay | Admitting: Physical Therapy

## 2020-02-09 ENCOUNTER — Ambulatory Visit (INDEPENDENT_AMBULATORY_CARE_PROVIDER_SITE_OTHER): Payer: 59 | Admitting: Surgery

## 2020-02-09 DIAGNOSIS — R262 Difficulty in walking, not elsewhere classified: Secondary | ICD-10-CM

## 2020-02-09 DIAGNOSIS — M6281 Muscle weakness (generalized): Secondary | ICD-10-CM

## 2020-02-09 DIAGNOSIS — M25571 Pain in right ankle and joints of right foot: Secondary | ICD-10-CM

## 2020-02-09 DIAGNOSIS — R600 Localized edema: Secondary | ICD-10-CM | POA: Diagnosis not present

## 2020-02-09 DIAGNOSIS — Z9889 Other specified postprocedural states: Secondary | ICD-10-CM | POA: Diagnosis not present

## 2020-02-09 MED ORDER — CEPHALEXIN 500 MG PO CAPS: 500.0000 mg | ORAL_CAPSULE | Freq: Four times a day (QID) | ORAL | 0 refills | Status: DC

## 2020-02-09 NOTE — Therapy (Signed)
[facility] Therapy [address] [place], [place], [address] Phone: [phone]   Fax:  [phone]  Physical Therapy Treatment  Patient Details  Name: Miranda Ross MRN: [id number] Date of Birth: [birth year] Referring Provider (PT): Lanae Crumbly, [place]   Encounter Date: 02/09/2020   PT End of Session - 02/09/20 1005    Visit Number 5    Number of Visits 12    Date for PT Re-Evaluation 02/15/20    PT Start Time 0930    PT Stop Time 1000    PT Time Calculation (min) 30 min    Activity Tolerance Patient tolerated treatment well;Treatment limited secondary to medical complications (Comment)   PA requested to see pt due to PT observations   Behavior During Therapy [facility] for tasks assessed/performed           Past Medical History:  Diagnosis Date   Arthritis    Asthma    uses albuterol inh prn   Diabetes mellitus without complication (HCC)    NIDDM   PONV (postoperative nausea and vomiting)    Raynaud's disease    Sleep apnea 2013   sleep study 2013 at [place], didnt finish study, left early    Past Surgical History:  Procedure Laterality Date   ACHILLES TENDON SURGERY Right 08/01/2019   Procedure: [place];  Surgeon: [name], MD;  Location: [place];  Service: Orthopedics;  Laterality: Right;   BACK SURGERY     cervical neck fusion 2002   FOOT SURGERY Left    topaz procedure   MASS EXCISION Left 09/02/2012   Procedure: MINOR EXCISION OF CYST LEFT RING FINGER A-3 PULLEY;  Surgeon: Cammie Sickle., MD;  Location: [place];  Service: Orthopedics;  Laterality: Left;   neck fusion  2002   OOPHORECTOMY     right hand surgery  2003   WISDOM TOOTH EXTRACTION     WRIST SURGERY Right    TFCC repair 2003    There were no vitals filed for this visit.   Subjective Assessment - 02/09/20 0927    Limitations Standing;Walking    How long can you stand comfortably? 20-30 min    Patient  Stated Goals improve pain, swelling, strength    Currently in Pain? Yes    Pain Score --   did not rate today   Pain Location Ankle    Pain Orientation Right    Pain Descriptors / Indicators Aching    Pain Type Acute pain;Surgical pain    Pain Onset More than a month ago    Pain Frequency Constant    Aggravating Factors  standing, walking, constant    Pain Relieving Factors meds, elevation              [facility] PT Assessment - 02/09/20 1001      Observation/Other Assessments   Observations point tenderness and redness with swelling noted along incision  - sent picture to Benjiman Core, PA-C and recommended she be seen                         [facility] Adult PT Treatment/Exercise - 02/09/20 0933      Ankle Exercises: Aerobic   Recumbent Bike L3 x 8 min      Ankle Exercises: Stretches   Soleus Stretch 3 reps;30 seconds    Soleus Stretch Limitations standing at counter    Gastroc Stretch 3 reps;30 seconds  Gastroc Stretch Limitations standing at counter      Ankle Exercises: Seated   Other Seated Ankle Exercises DF with L4 theraband x 20 reps                    PT Short Term Goals - 02/02/20 1010      PT SHORT TERM GOAL #1   Title She will be independent with initial  HEP     Status Achieved    Target Date 01/25/20      PT SHORT TERM GOAL #2   Title improve Rt ankle AROM by at least 5 degrees all motions for improved function    Status Achieved    Target Date 01/25/20             PT Long Term Goals - 01/31/20 1005      PT LONG TERM GOAL #1   Title She will be independent with all HEP issued as of last visit    Status On-going    Target Date 02/15/20      PT LONG TERM GOAL #2   Title report ability to work 4-8 hours with pain < 4/10 for improved function and mobility    Status On-going    Target Date 02/15/20      PT LONG TERM GOAL #3   Title demonstrate 3/5 plantarflexion strength for improved function    Status On-going    Target  Date 02/15/20      PT LONG TERM GOAL #4   Title demonstrate improved strength and balance by performing RLE SLS > 10 sec    Status On-going    Target Date 02/15/20      PT LONG TERM GOAL #5   Title n/a                 Plan - 02/09/20 1005    Clinical Impression Statement Session ended early as observation of incision showed point tenderness, redness and swelling to isolated area and PA-C requesting to see pt.  Will continue to benefit from PT to maximize function.    Personal Factors and Comorbidities Age;Comorbidity 3+;Past/Current Experience;Fitness;Time since onset of injury/illness/exacerbation    Comorbidities arthritis, asthma, DM, obesity    Examination-Activity Limitations Lift;Stand;Locomotion Level;Transfers;Squat;Stairs;Carry    Examination-Participation Restrictions Community Activity;Occupation;Shop    Stability/Clinical Decision Making Evolving/Moderate complexity    Rehab Potential Good    PT Frequency 2x / week    PT Duration 6 weeks    PT Treatment/Interventions ADLs/Self Care Home Management;Cryotherapy;Electrical Stimulation;Ultrasound;Iontophoresis 4mg /ml Dexamethasone;Gait training;Stair training;Functional mobility training;Moist Heat;Therapeutic activities;Balance training;Neuromuscular re-education;Therapeutic exercise;Patient/family education;Scar mobilization;Manual techniques;Passive range of motion;Dry needling;Taping;Vasopneumatic Device    PT Next Visit Plan review HEP, work on balance/strengthening, manual/modalities PRN for pain/ROM    PT Home Exercise Plan Access Code: [email or web address]    Consulted and Agree with Plan of Care Patient           Patient will benefit from skilled therapeutic intervention in order to improve the following deficits and impairments:  Abnormal gait, Increased fascial restricitons, Pain, Decreased scar mobility, Decreased mobility, Decreased activity tolerance, Decreased balance, Decreased range of motion, Decreased strength,  Impaired flexibility, Increased edema, Difficulty walking  Visit Diagnosis: Pain in right ankle and joints of right foot  Muscle weakness (generalized)  Localized edema  Difficulty in walking, not elsewhere classified     Problem List Patient Active Problem List   Diagnosis Date Noted   Rupture of right Achilles tendon 08/01/2019   Achilles rupture, right 07/21/2019  Class 3 obesity with body mass index (BMI) of 50.0 to 59.9 in adult 01/29/2017   Polyclonal gammopathy determined by serum protein electrophoresis 08/21/2016   MGUS (monoclonal gammopathy of unknown significance) 08/18/2016   History of asthma 07/28/2016   Raynaud's disease without gangrene 07/24/2016   Primary osteoarthritis of both knees 07/24/2016   DJD (degenerative joint disease), cervical 07/24/2016   History of gastroesophageal reflux (GERD) 07/24/2016   ANA positive 07/24/2016   Trochanteric bursitis of both hips 07/24/2016   Hyperlipidemia 06/18/2013   Hematuria 06/18/2013       Laureen Abrahams, PT, DPT 02/09/20 10:07 AM    [facility] [address] [place], [place], [address] Phone: [phone]   Fax:  [phone]  Name: Miranda Ross MRN: [id number] Date of Birth: [birth year]

## 2020-02-10 ENCOUNTER — Encounter: Payer: Self-pay | Admitting: Surgery

## 2020-02-10 NOTE — Progress Notes (Signed)
  44 year old white female was being seen by the physical therapist today and therapist advised me that patient was having an area on the side of her surgical scar that was very painful and swollen.  Patient states that this area has always been there.  Last office visit I performed a retrocalcaneal bursa Marcaine/Medrol injection and patient states that this is given her excellent relief.  She has had decreased swelling and also improved range of motion with the injection and also working with physical therapy.  Exam Patient is ambulating better.  She also has better range of motion with dorsiflexion and plantarflexion.  She does have a small superficial area around the mid aspect of her surgical scar about half a centimeter that is swollen and very tender to palpation.  Some redness but no gross signs of infection.  This area is well away from where I performed my injection last office visit.  Plan Patient was started on prophylactic Keflex 500 mg 1 tab p.o. every 6 hours.  Wonder if this may be from a possible stitch abscess.  It is hard to tell at this point.  Spoke with Dr. Lorin Mercy about this.  He said hold off on physical therapy for now.  Patient will follow up next Tuesday at her regular scheduled appointment with Dr. Lorin Mercy for recheck.  Let us know if area worsens.  All questions answered.

## 2020-02-14 ENCOUNTER — Encounter: Payer: 59 | Admitting: Physical Therapy

## 2020-02-14 ENCOUNTER — Ambulatory Visit (INDEPENDENT_AMBULATORY_CARE_PROVIDER_SITE_OTHER): Payer: 59 | Admitting: Orthopaedic Surgery

## 2020-02-14 ENCOUNTER — Encounter: Payer: Self-pay | Admitting: Orthopaedic Surgery

## 2020-02-14 VITALS — BP 143/72 | HR 94

## 2020-02-14 DIAGNOSIS — Z9889 Other specified postprocedural states: Secondary | ICD-10-CM

## 2020-02-14 NOTE — Progress Notes (Signed)
44 year old female returns for recheck.  She continues have area on her surgical scar that is tender.  States not any worse or any better since she was last seen by me.   Exam Patient seen with Dr. Lorin Mercy today.  Area long surgical scar still exquisitely tender.  No signs of infection.  Calf nontender.   Plan She will continue current work restrictions.  Dr Lorin Mercy wants patient to use her boot for a month and return in 4 weeks for recheck. Still hold PT for now.

## 2020-02-17 ENCOUNTER — Encounter: Payer: 59 | Admitting: Physical Therapy

## 2020-02-19 ENCOUNTER — Encounter: Payer: Self-pay | Admitting: Orthopaedic Surgery

## 2020-02-20 NOTE — Telephone Encounter (Signed)
Please see below in regards to 01/03/2020 office visit. There is no dictation there. Could you please dictate so that office notes can be sent? Thanks.

## 2020-02-21 NOTE — Progress Notes (Signed)
Office Visit Note   Patient: Miranda Ross           Date of Birth: [birth year]           MRN: [id number] Visit Date: 01/03/2020              Requested by: Everardo Beals, NP [place],  [place] [id number] PCP: Everardo Beals, NP   Assessment & Plan: Visit Diagnoses:  1. S/P Achilles tendon repair     Plan: Since patient continues have ongoing pain and stiffness that is not improving recommend starting formal PT to see if this will help recovery.  She was given a note for her to work on Mondays and Fridays only from a 30 a.m. to 12:30 PM x2 weeks and that she will follow-up as scheduled for reevaluation.  Follow-Up Instructions: Return in about 2 weeks (around 01/17/2020) for with Kalin Amrhein.   Orders:  Orders Placed This Encounter  Procedures  . Ambulatory referral to Physical Therapy   No orders of the defined types were placed in this encounter.     Procedures: No procedures performed   Clinical Data: No additional findings.   Subjective: Chief Complaint  Patient presents with  . Right Ankle - Follow-up    08/01/2019 Right achilles repair    HPI 44 year old white female who is status post right Achilles tendon repair August 01, 2019 returns.  Patient continues have ongoing pain and stiffness.  She is back to work where she does a lot of standing and walking.  Patient did not have formal PT after her surgery.  She has tried to do home exercises but this has been very difficult due to the pain weakness that she has.  She also continues have ongoing swelling around her posterior heel and ankle. Review of Systems No current cardiac pulmonary GI GU issues  Objective: Vital Signs: BP (!) 142/92   Ht 5\' 3"  (1.6 m)   Wt 279 lb (126.6 kg)   BMI 49.42 kg/m   Physical Exam Gait is antalgic.  Patient is unable to do a single limb heel raise on the right.  No issues on the left side.  Right Achilles tendon intact.  She has marked tenderness along the  tendon, pre-Achilles bursa and also some soreness up into her musculotendinous junction.  Patient does have some right calf atrophy.  Limited range of motion with ankle dorsiflexion and plantar flexion and has a considerable amount of pain with these maneuvers.  No signs of infection.  Swelling posterior ankle. Ortho Exam  Specialty Comments:  No specialty comments available.  Imaging: No results found.   PMFS History: Patient Active Problem List   Diagnosis Date Noted  . Rupture of right Achilles tendon 08/01/2019  . Achilles rupture, right 07/21/2019  . Class 3 obesity with body mass index (BMI) of 50.0 to 59.9 in adult 01/29/2017  . Polyclonal gammopathy determined by serum protein electrophoresis 08/21/2016  . MGUS (monoclonal gammopathy of unknown significance) 08/18/2016  . History of asthma 07/28/2016  . Raynaud's disease without gangrene 07/24/2016  . Primary osteoarthritis of both knees 07/24/2016  . DJD (degenerative joint disease), cervical 07/24/2016  . History of gastroesophageal reflux (GERD) 07/24/2016  . ANA positive 07/24/2016  . Trochanteric bursitis of both hips 07/24/2016  . Hyperlipidemia 06/18/2013  . Hematuria 06/18/2013   Past Medical History:  Diagnosis Date  . Arthritis   . Asthma    uses albuterol inh prn  . Diabetes mellitus without  complication (HCC)    NIDDM  . PONV (postoperative nausea and vomiting)   . Raynaud's disease   . Sleep apnea 2013   sleep study 2013 at [facility], didnt finish study, left early    Family History  Problem Relation Age of Onset  . Diabetes Mother   . Heart disease Mother   . Depression Mother   . Diverticulosis Mother   . Non-Hodgkin's lymphoma Father   . Scleroderma Sister     Past Surgical History:  Procedure Laterality Date  . ACHILLES TENDON SURGERY Right 08/01/2019   Procedure: RIGHT REPAIR ACHILLES TENDON AVULSION;  Surgeon: [name], MD;  Location: [facility];  Service: Orthopedics;  Laterality: Right;  .  BACK SURGERY     cervical neck fusion 2002  . FOOT SURGERY Left    topaz procedure  . MASS EXCISION Left 09/02/2012   Procedure: MINOR EXCISION OF CYST LEFT RING FINGER A-3 PULLEY;  Surgeon: Cammie Sickle., MD;  Location: [facility];  Service: Orthopedics;  Laterality: Left;  . neck fusion  2002  . OOPHORECTOMY    . right hand surgery  2003  . WISDOM TOOTH EXTRACTION    . WRIST SURGERY Right    TFCC repair 2003   Social History   Occupational History  . Not on file  Tobacco Use  . Smoking status: Never Smoker  . Smokeless tobacco: Never Used  Vaping Use  . Vaping Use: Never used  Substance and Sexual Activity  . Alcohol use: No  . Drug use: No  . Sexual activity: Yes    Birth control/protection: I.U.D.    Comment: Mirena IUD placed 2016

## 2020-02-28 ENCOUNTER — Encounter: Payer: 59 | Admitting: Physical Therapy

## 2020-03-01 ENCOUNTER — Encounter: Payer: Self-pay | Admitting: Physical Therapy

## 2020-03-01 ENCOUNTER — Telehealth: Payer: Self-pay | Admitting: Rheumatology

## 2020-03-01 ENCOUNTER — Telehealth: Payer: Self-pay | Admitting: Radiology

## 2020-03-01 MED ORDER — TRAMADOL HCL 50 MG PO TABS
50.0000 mg | ORAL_TABLET | Freq: Two times a day (BID) | ORAL | 0 refills | Status: DC | PRN
Start: 2020-03-01 — End: 2020-03-26

## 2020-03-01 NOTE — Telephone Encounter (Signed)
Medical records [facility] notes faxed to [facility] #[phone]

## 2020-03-01 NOTE — Telephone Encounter (Signed)
Patient request refill on Tramadol sent to [facility] on [address]. Patient has not had Pain Management appointment as of yet. Appointment scheduled for 03/09/2020.

## 2020-03-01 NOTE — Telephone Encounter (Signed)
Last Visit: 10/17/2019 Next Visit: 04/16/2020 UDS: 10/17/2019 Narc Agreement: 10/17/2019  Last Fill: 01/26/2020  Okay to refill Tramadol?

## 2020-03-01 NOTE — Telephone Encounter (Signed)
Patient left message at [facility] office stating her [facility] needs all medical records from 07/21/19-01/03/20 including her op note. She states that they need this today.  Could you please send these for me?

## 2020-03-06 ENCOUNTER — Encounter: Payer: 59 | Admitting: Physical Therapy

## 2020-03-08 ENCOUNTER — Encounter: Payer: 59 | Admitting: Physical Therapy

## 2020-03-09 ENCOUNTER — Encounter: Payer: 59 | Admitting: Physical Medicine and Rehabilitation

## 2020-03-09 ENCOUNTER — Encounter: Payer: 59 | Attending: Physical Medicine and Rehabilitation | Admitting: Physical Medicine and Rehabilitation

## 2020-03-09 ENCOUNTER — Encounter: Payer: Self-pay | Admitting: Physical Medicine and Rehabilitation

## 2020-03-09 ENCOUNTER — Other Ambulatory Visit: Payer: Self-pay

## 2020-03-09 VITALS — BP 132/89 | HR 91 | Temp 98.8°F | Ht 63.0 in | Wt 294.0 lb

## 2020-03-09 DIAGNOSIS — S86011D Strain of right Achilles tendon, subsequent encounter: Secondary | ICD-10-CM | POA: Insufficient documentation

## 2020-03-09 DIAGNOSIS — M7062 Trochanteric bursitis, left hip: Secondary | ICD-10-CM | POA: Diagnosis not present

## 2020-03-09 DIAGNOSIS — M7552 Bursitis of left shoulder: Secondary | ICD-10-CM | POA: Insufficient documentation

## 2020-03-09 MED ORDER — GABAPENTIN 300 MG PO CAPS: 300.0000 mg | ORAL_CAPSULE | Freq: Three times a day (TID) | ORAL | 0 refills | Status: DC

## 2020-03-09 NOTE — Progress Notes (Signed)
Subjective:    Patient ID: Miranda Ross, female    DOB: 09/13/1975, 44 y.o.   MRN: [id number]  HPI  Miranda Ross is a 44 year woman who presents to establish care for left shoulder and hip bursitis.   She gets steroid injections every 6 months or longer, at least once per year.   She had an Achilles rupture and this has aggravated things. She follows with Dr. Lorin Mercy.   She takes Tramadol 50mg  BID.   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   Pain on average is 5/10.   Pain Inventory Average Pain 5 Pain Right Now 6 My pain is constant and aching  In the last 24 hours, has pain interfered with the following? General activity 6 Relation with others 0 Enjoyment of life 5 What TIME of day is your pain at its worst? evening Sleep (in general) Poor  Pain is worse with: walking and standing Pain improves with: rest, heat/ice and medication Relief from Meds: 7  use a cane how many minutes can you walk? 3-4 mins- ( In a cam-walkern boot) ability to climb steps?  yes do you drive?  yes Do you have any goals in this area?  yes    weakness numbness tingling trouble walking  New Patient  New Patient    Family History  Problem Relation Age of Onset  . Diabetes Mother   . Heart disease Mother   . Depression Mother   . Diverticulosis Mother   . Non-Hodgkin's lymphoma Father   . Scleroderma Sister    Social History   Socioeconomic History  . Marital status: Married    Spouse name: Not on file  . Number of children: Not on file  . Years of education: Not on file  . Highest education level: Not on file  Occupational History  . Not on file  Tobacco Use  . Smoking status: Never Smoker  . Smokeless tobacco: Never Used  Vaping Use  . Vaping Use: Never used  Substance and Sexual Activity  . Alcohol use: No  . Drug use: No  . Sexual activity: Yes    Birth control/protection: I.U.D.    Comment: Mirena IUD placed 2016  Other Topics Concern  . Not on  file  Social History Narrative  . Not on file   Social Determinants of Health   Financial Resource Strain:   . Difficulty of Paying Living Expenses: Not on file  Food Insecurity:   . Worried About Charity fundraiser in the Last Year: Not on file  . Ran Out of Food in the Last Year: Not on file  Transportation Needs:   . Lack of Transportation (Medical): Not on file  . Lack of Transportation (Non-Medical): Not on file  Physical Activity:   . Days of Exercise per Week: Not on file  . Minutes of Exercise per Session: Not on file  Stress:   . Feeling of Stress : Not on file  Social Connections:   . Frequency of Communication with Friends and Family: Not on file  . Frequency of Social Gatherings with Friends and Family: Not on file  . Attends Religious Services: Not on file  . Active Member of Clubs or Organizations: Not on file  . Attends Archivist Meetings: Not on file  . Marital Status: Not on file   Past Surgical History:  Procedure Laterality Date  . ACHILLES TENDON SURGERY Right 08/01/2019   Procedure: RIGHT REPAIR ACHILLES TENDON  AVULSION;  Surgeon: [name], MD;  Location: [facility];  Service: Orthopedics;  Laterality: Right;  . BACK SURGERY     cervical neck fusion 2002  . FOOT SURGERY Left    topaz procedure  . MASS EXCISION Left 09/02/2012   Procedure: MINOR EXCISION OF CYST LEFT RING FINGER A-3 PULLEY;  Surgeon: Cammie Sickle., MD;  Location: [facility];  Service: Orthopedics;  Laterality: Left;  . neck fusion  2002  . OOPHORECTOMY    . right hand surgery  2003  . WISDOM TOOTH EXTRACTION    . WRIST SURGERY Right    TFCC repair 2003   Past Medical History:  Diagnosis Date  . Arthritis   . Asthma    uses albuterol inh prn  . Diabetes mellitus without complication (HCC)    NIDDM  . PONV (postoperative nausea and vomiting)   . Raynaud's disease   . Sleep apnea 2013   sleep study 2013 at [place], didnt finish study, left early    BP 132/89   Pulse 91   Temp 98.8 F (37.1 C)   Ht 5\' 3"  (1.6 m)   Wt 294 lb (133.4 kg)   SpO2 97%   BMI 52.08 kg/m   Opioid Risk Score:   Fall Risk Score:  `1  Depression screen PHQ 2/9  Depression screen PHQ 2/9 03/09/2020  Decreased Interest 0  Down, Depressed, Hopeless 1  PHQ - 2 Score 1  Altered sleeping 1  Tired, decreased energy 1  Change in appetite 0  Feeling bad or failure about yourself  0  Trouble concentrating 0  Moving slowly or fidgety/restless 0  Suicidal thoughts 0  PHQ-9 Score 3   Review of Systems  Constitutional: Negative.   HENT: Negative.   Eyes: Negative.   Respiratory: Negative.   Cardiovascular: Positive for leg swelling.  Gastrointestinal: Negative.   Endocrine: Negative.   Genitourinary: Negative.   Musculoskeletal: Positive for gait problem.       Left shoulder, hips & right foot  Skin: Negative.   Allergic/Immunologic: Negative.   Hematological: Negative.   Psychiatric/Behavioral: Negative.   All other systems reviewed and are negative.      Objective:   Physical Exam Gen: no distress, normal appearing HEENT: oral mucosa pink and moist, NCAT Cardio: Reg rate Chest: normal effort, normal rate of breathing Abd: soft, non-distended Ext: no edema Skin: intact Neuro: Alert and oriented x3.  Musculoskeletal: Right foot in CAM boot. Tenderness to palpation over left subacromial and greater trochanteric bursas.  Psych: pleasant, normal affect     Assessment & Plan:  Miranda Ross is a 44 year old woman who presents to establish care for left shoulder and hip bursitis.  1) Left shoulder bursitis: -UDS and Pain contract obtained today. -Provided her with my cell phone number so she can call when she needs a refill of this medication. She takes 50mg  BID.   2) Intercostal neuralgia, right side: -Increase gabapentin 300mg  TID  3) Peripheral neuropathy secondary to Type 2 DM: -Continue freestyle libre which has helped her get  better control (dropped A1c by 3 points.

## 2020-03-13 ENCOUNTER — Encounter: Payer: Self-pay | Admitting: Orthopaedic Surgery

## 2020-03-13 ENCOUNTER — Ambulatory Visit (INDEPENDENT_AMBULATORY_CARE_PROVIDER_SITE_OTHER): Payer: 59 | Admitting: Orthopaedic Surgery

## 2020-03-13 ENCOUNTER — Encounter: Payer: 59 | Admitting: Physical Therapy

## 2020-03-13 DIAGNOSIS — Z9889 Other specified postprocedural states: Secondary | ICD-10-CM

## 2020-03-13 NOTE — Addendum Note (Signed)
Addended by: Meyer Cory on: 03/13/2020 09:45 AM   Modules accepted: Orders

## 2020-03-13 NOTE — Progress Notes (Signed)
Office Visit Note   Patient: Miranda Ross           Date of Birth: 1975-05-27           MRN: [id number] Visit Date: 03/13/2020              Requested by: Everardo Beals, NP [place],  [place] [id number] PCP: Everardo Beals, NP   Assessment & Plan: Visit Diagnoses:  1. S/P Achilles tendon repair     Plan: She continue working 4 hours/day 2 days a week.  Prior to her rupture she was doing 4 hours/day 5 days a week.  We will restart therapy 2 days a week with gradual calf strengthening.  Work slip given for continued 4 hours/day 2 days a week x1 month and she can increase it to 3 days a week for 1 month after that.  I plan to recheck her in 6 weeks.  Follow-Up Instructions: Return in about 6 weeks (around 04/24/2020).   Orders:  No orders of the defined types were placed in this encounter.  No orders of the defined types were placed in this encounter.     Procedures: No procedures performed   Clinical Data: No additional findings.   Subjective: Chief Complaint  Patient presents with  . Right Ankle - Follow-up    08/01/2019 Right achilles repair    HPI postop right Achilles tendon repair 08/01/2019.  Postop she was then therapy doing calf strengthening started 3 months after surgery when she had some proximal portion of her incision began having slight drainage with some superficial dehiscence.  Achilles tendons remained intact she was placed back in a boot.  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  She is using Tylenol or ibuprofen as needed.  She takes the boot off at home she noticed weakness in her calf.  She not had any drainage in several weeks.  Review of Systems 14 point systems updated unchanged.   Objective: Vital Signs: BP (!) 165/86   Ht 5\' 3"  (1.6 m)   Wt 294 lb (133.4 kg)   BMI 52.08 kg/m   Physical Exam Constitutional:      Appearance: She is well-developed.  HENT:     Head: Normocephalic.     Right  Ear: External ear normal.     Left Ear: External ear normal.  Eyes:     Pupils: Pupils are equal, round, and reactive to light.  Neck:     Thyroid: No thyromegaly.     Trachea: No tracheal deviation.  Cardiovascular:     Rate and Rhythm: Normal rate.  Pulmonary:     Effort: Pulmonary effort is normal.  Abdominal:     Palpations: Abdomen is soft.  Skin:    General: Skin is warm and dry.  Neurological:     Mental Status: She is alert and oriented to person, place, and time.  Psychiatric:        Behavior: Behavior normal.     Ortho Exam Achilles repair is intact skin is intact there is no drainage.  There is some thickening where she had the rupture and repair.  She has weakness with attempts at toe raising.  Specialty Comments:  No specialty comments available.  Imaging: No results found.   PMFS History: Patient Active Problem List   Diagnosis Date Noted  . S/P Achilles tendon repair 03/13/2020  . Rupture of right Achilles tendon 08/01/2019  . Achilles rupture, right  07/21/2019  . Class 3 obesity with body mass index (BMI) of 50.0 to 59.9 in adult 01/29/2017  . Polyclonal gammopathy determined by serum protein electrophoresis 08/21/2016  . MGUS (monoclonal gammopathy of unknown significance) 08/18/2016  . History of asthma 07/28/2016  . Raynaud's disease without gangrene 07/24/2016  . Primary osteoarthritis of both knees 07/24/2016  . DJD (degenerative joint disease), cervical 07/24/2016  . History of gastroesophageal reflux (GERD) 07/24/2016  . ANA positive 07/24/2016  . Trochanteric bursitis of both hips 07/24/2016  . Hyperlipidemia 06/18/2013  . Hematuria 06/18/2013   Past Medical History:  Diagnosis Date  . Arthritis   . Asthma    uses albuterol inh prn  . Diabetes mellitus without complication (HCC)    NIDDM  . PONV (postoperative nausea and vomiting)   . Raynaud's disease   . Sleep apnea 2013   sleep study 2013 at [place], didnt finish study, left early      Family History  Problem Relation Age of Onset  . Diabetes Mother   . Heart disease Mother   . Depression Mother   . Diverticulosis Mother   . Non-Hodgkin's lymphoma Father   . Scleroderma Sister     Past Surgical History:  Procedure Laterality Date  . ACHILLES TENDON SURGERY Right 08/01/2019   Procedure: RIGHT REPAIR ACHILLES TENDON AVULSION;  Surgeon: [name], MD;  Location: [facility];  Service: Orthopedics;  Laterality: Right;  . BACK SURGERY     cervical neck fusion 2002  . FOOT SURGERY Left    topaz procedure  . MASS EXCISION Left 09/02/2012   Procedure: MINOR EXCISION OF CYST LEFT RING FINGER A-3 PULLEY;  Surgeon: Cammie Sickle., MD;  Location: [facility];  Service: Orthopedics;  Laterality: Left;  . neck fusion  2002  . OOPHORECTOMY    . right hand surgery  2003  . WISDOM TOOTH EXTRACTION    . WRIST SURGERY Right    TFCC repair 2003   Social History   Occupational History  . Not on file  Tobacco Use  . Smoking status: Never Smoker  . Smokeless tobacco: Never Used  Vaping Use  . Vaping Use: Never used  Substance and Sexual Activity  . Alcohol use: No  . Drug use: No  . Sexual activity: Yes    Birth control/protection: I.U.D.    Comment: Mirena IUD placed 2016

## 2020-03-15 ENCOUNTER — Encounter: Payer: 59 | Admitting: Physical Therapy

## 2020-03-20 ENCOUNTER — Other Ambulatory Visit: Payer: Self-pay

## 2020-03-20 ENCOUNTER — Ambulatory Visit (INDEPENDENT_AMBULATORY_CARE_PROVIDER_SITE_OTHER): Payer: 59 | Admitting: Physical Therapy

## 2020-03-20 ENCOUNTER — Encounter: Payer: Self-pay | Admitting: Physical Therapy

## 2020-03-20 DIAGNOSIS — R600 Localized edema: Secondary | ICD-10-CM

## 2020-03-20 DIAGNOSIS — R262 Difficulty in walking, not elsewhere classified: Secondary | ICD-10-CM

## 2020-03-20 DIAGNOSIS — M6281 Muscle weakness (generalized): Secondary | ICD-10-CM | POA: Diagnosis not present

## 2020-03-20 DIAGNOSIS — M25571 Pain in right ankle and joints of right foot: Secondary | ICD-10-CM

## 2020-03-20 NOTE — Therapy (Signed)
[facility] [address] [place], [place], [address] Phone: [phone]   Fax:  [phone]  Physical Therapy Re-Evaluation  Patient Details  Name: Miranda Ross MRN: [id number] Date of Birth: 1975/08/26 Referring Provider (PT): Lanae Crumbly, PA-C / Rodell Perna, MD   Encounter Date: 03/20/2020   PT End of Session - 03/20/20 1132    Visit Number 6    Number of Visits 12    Date for PT Re-Evaluation 05/01/20    PT Start Time 1055    PT Stop Time 1130    PT Time Calculation (min) 35 min    Activity Tolerance Patient tolerated treatment well;Treatment limited secondary to medical complications (Comment)   PA requested to see pt due to PT observations   Behavior During Therapy [facility] for tasks assessed/performed           Past Medical History:  Diagnosis Date   Arthritis    Asthma    uses albuterol inh prn   Diabetes mellitus without complication ([place])    NIDDM   PONV (postoperative nausea and vomiting)    Raynaud's disease    Sleep apnea 2013   sleep study 2013 at [place], didnt finish study, left early    Past Surgical History:  Procedure Laterality Date   ACHILLES TENDON SURGERY Right 08/01/2019   Procedure: [place];  Surgeon: [name], MD;  Location: [place];  Service: Orthopedics;  Laterality: Right;   BACK SURGERY     cervical neck fusion 2002   FOOT SURGERY Left    topaz procedure   MASS EXCISION Left 09/02/2012   Procedure: MINOR EXCISION OF CYST LEFT RING FINGER A-3 PULLEY;  Surgeon: Cammie Sickle., MD;  Location: [place];  Service: Orthopedics;  Laterality: Left;   neck fusion  2002   OOPHORECTOMY     right hand surgery  2003   WISDOM TOOTH EXTRACTION     WRIST SURGERY Right    TFCC repair 2003    There were no vitals filed for this visit.    Subjective Assessment - 03/20/20 1059    Subjective returns to OPPT following a hold due to possible popping  of internal stitch.  back in boot for longer distances, but overall improving.  still having continued pain along each side of achilles tendon.    Limitations Standing;Walking    How long can you stand comfortably? 20-30 min    Patient Stated Goals improve pain, swelling, strength    Currently in Pain? Yes    Pain Score 6    up to 8/10, at best 4/10   Pain Location Ankle    Pain Orientation Right    Pain Descriptors / Indicators Aching;Dull;Nagging    Pain Type Chronic pain;Surgical pain    Pain Onset More than a month ago    Pain Frequency Constant    Aggravating Factors  standing, walking, constant    Pain Relieving Factors meds, elevation              [facility] PT Assessment - 03/20/20 1103      Assessment   Medical Diagnosis Z98.890 (ICD-10-CM) - S/P Achilles tendon repair    Referring Provider (PT) Lanae Crumbly, PA-C / Rodell Perna, MD      Functional Tests   Functional tests Single leg stance      Single Leg Stance   Comments RLE: 4 sec; LLE: 10 sec      AROM  Right Ankle Dorsiflexion 4    Right Ankle Plantar Flexion 36    Right Ankle Inversion 21    Right Ankle Eversion 12      PROM   Right Ankle Dorsiflexion 10    Right Ankle Plantar Flexion 45    Right Ankle Eversion 15      Strength   Overall Strength Comments pain with all testing    Right Ankle Dorsiflexion 4/5    Right Ankle Plantar Flexion 1/5    Right Ankle Inversion 4/5    Right Ankle Eversion 4/5                      Objective measurements completed on examination: See above findings.               PT Education - 03/20/20 1132    Education Details HEP    Person(s) Educated Patient    Methods Explanation;Demonstration;Handout    Comprehension Returned demonstration;Verbalized understanding;Need further instruction            PT Short Term Goals - 02/02/20 1010      PT SHORT TERM GOAL #1   Title She will be independent with initial  HEP     Status Achieved     Target Date 01/25/20      PT SHORT TERM GOAL #2   Title improve Rt ankle AROM by at least 5 degrees all motions for improved function    Status Achieved    Target Date 01/25/20             PT Long Term Goals - 03/20/20 1132      PT LONG TERM GOAL #1   Title She will be independent with all HEP issued as of last visit    Baseline 11/9: new exercises given today    Status On-going    Target Date 05/01/20      PT LONG TERM GOAL #2   Title report ability to work 4-8 hours with pain < 4/10 for improved function and mobility    Baseline 11/9: still only working up to 4 hours, pain up to 8/10    Status On-going    Target Date 05/01/20      PT LONG TERM GOAL #3   Title demonstrate 3/5 plantarflexion strength for improved function    Baseline 11/9: still 1/5 strength    Status On-going    Target Date 05/01/20      PT LONG TERM GOAL #4   Title demonstrate improved strength and balance by performing RLE SLS > 10 sec    Baseline 11/9: 4 seconds    Status On-going    Target Date 05/01/20      PT LONG TERM GOAL #5   Title --                  Plan - 03/20/20 1134    Clinical Impression Statement Pt returns to PT after holding for ~ 5 weeks due to internal stitch popping and needing to rest ankle.  Overall demonstrates no functional decline since holding PT, but all goals still appropritate and ongoing at this time.  Will benefit from PT to address deficits listed.    Personal Factors and Comorbidities Age;Comorbidity 3+;Past/Current Experience;Fitness;Time since onset of injury/illness/exacerbation    Comorbidities arthritis, asthma, DM, obesity    Examination-Activity Limitations Lift;Stand;Locomotion Level;Transfers;Squat;Stairs;Carry    Examination-Participation Restrictions Community Activity;Occupation;Shop    Stability/Clinical Decision Making Evolving/Moderate complexity    Rehab Potential  Good    PT Frequency 1x / week    PT Duration 6 weeks    PT  Treatment/Interventions ADLs/Self Care Home Management;Cryotherapy;Electrical Stimulation;Ultrasound;Iontophoresis 4mg /ml Dexamethasone;Gait training;Stair training;Functional mobility training;Moist Heat;Therapeutic activities;Balance training;Neuromuscular re-education;Therapeutic exercise;Patient/family education;Scar mobilization;Manual techniques;Passive range of motion;Dry needling;Taping;Vasopneumatic Device    PT Next Visit Plan review HEP, work on balance/strengthening, manual/modalities PRN for pain/ROM    PT Home Exercise Plan Access Code: [email or web address]    Consulted and Agree with Plan of Care Patient           Patient will benefit from skilled therapeutic intervention in order to improve the following deficits and impairments:  Abnormal gait, Increased fascial restricitons, Pain, Decreased scar mobility, Decreased mobility, Decreased activity tolerance, Decreased balance, Decreased range of motion, Decreased strength, Impaired flexibility, Increased edema, Difficulty walking  Visit Diagnosis: Pain in right ankle and joints of right foot - Plan: PT plan of care cert/re-cert  Muscle weakness (generalized) - Plan: PT plan of care cert/re-cert  Localized edema - Plan: PT plan of care cert/re-cert  Difficulty in walking, not elsewhere classified - Plan: PT plan of care cert/re-cert     Problem List Patient Active Problem List   Diagnosis Date Noted   S/P Achilles tendon repair 03/13/2020   Rupture of right Achilles tendon 08/01/2019   Achilles rupture, right 07/21/2019   Class 3 obesity with body mass index (BMI) of 50.0 to 59.9 in adult 01/29/2017   Polyclonal gammopathy determined by serum protein electrophoresis 08/21/2016   MGUS (monoclonal gammopathy of unknown significance) 08/18/2016   History of asthma 07/28/2016   Raynaud's disease without gangrene 07/24/2016   Primary osteoarthritis of both knees 07/24/2016   DJD (degenerative joint disease), cervical  07/24/2016   History of gastroesophageal reflux (GERD) 07/24/2016   ANA positive 07/24/2016   Trochanteric bursitis of both hips 07/24/2016   Hyperlipidemia 06/18/2013   Hematuria 06/18/2013     Laureen Abrahams, PT, DPT 03/20/20 11:37 AM     [facility] [address] [place], [place], [address] Phone: [phone]   Fax:  [phone]  Name: Miranda Ross MRN: [id number] Date of Birth: 10/19/1975

## 2020-03-20 NOTE — Patient Instructions (Signed)
Access Code: [email or web address] URL: [email or web address] Date: 03/20/2020 Prepared by: Faustino Congress  Exercises Seated Ankle Pumps on Table - 2 x daily - 7 x weekly - 1 sets - 15 reps Seated Ankle Circles - 2 x daily - 7 x weekly - 15 reps - 1 sets Seated Ankle Alphabet - 2 x daily - 7 x weekly - 1 sets - 1 a-z Heel rises with counter support - 1 x daily - 7 x weekly - 20 reps - 1 sets Standing Single Leg Stance with Unilateral Counter Support - 2 x daily - 7 x weekly - 3 reps - 1 sets - 10 sec hold Seated Ankle Plantarflexion Dorsiflexion PROM - 2 x daily - 7 x weekly - 1 sets - 10 reps Seated Arch Lifts - 2 x daily - 7 x weekly - 1 sets - 10 reps - 5 sec hold Seated Ankle Plantar Flexion with Resistance Loop - 2 x daily - 7 x weekly - 1 sets - 20 reps Seated Ankle Dorsiflexion with Resistance - 2 x daily - 7 x weekly - 1 sets - 20 reps Seated Ankle Eversion with Resistance - 2 x daily - 7 x weekly - 20 reps - 1 sets Seated Figure 4 Ankle Inversion with Resistance - 2 x daily - 7 x weekly - 1 sets - 20 reps

## 2020-03-26 ENCOUNTER — Other Ambulatory Visit: Payer: Self-pay | Admitting: Physical Medicine and Rehabilitation

## 2020-03-26 MED ORDER — TRAMADOL HCL 50 MG PO TABS: 50.0000 mg | ORAL_TABLET | Freq: Two times a day (BID) | ORAL | 0 refills | Status: DC | PRN

## 2020-03-27 ENCOUNTER — Encounter: Payer: Self-pay | Admitting: Physical Therapy

## 2020-03-27 ENCOUNTER — Other Ambulatory Visit: Payer: Self-pay

## 2020-03-27 ENCOUNTER — Ambulatory Visit (INDEPENDENT_AMBULATORY_CARE_PROVIDER_SITE_OTHER): Payer: 59 | Admitting: Physical Therapy

## 2020-03-27 DIAGNOSIS — M6281 Muscle weakness (generalized): Secondary | ICD-10-CM | POA: Diagnosis not present

## 2020-03-27 DIAGNOSIS — M25571 Pain in right ankle and joints of right foot: Secondary | ICD-10-CM

## 2020-03-27 DIAGNOSIS — R262 Difficulty in walking, not elsewhere classified: Secondary | ICD-10-CM | POA: Diagnosis not present

## 2020-03-27 DIAGNOSIS — R600 Localized edema: Secondary | ICD-10-CM | POA: Diagnosis not present

## 2020-03-27 NOTE — Therapy (Addendum)
[facility] [address] [place], [place], [address] Phone: [phone]   Fax:  [phone]  Physical Therapy Treatment/Discharge Summary  Patient Details  Name: Miranda Ross MRN: [id number] Date of Birth: Aug 12, 1975 Referring Provider (PT): Lanae Crumbly, PA-C / Rodell Perna, MD   Encounter Date: 03/27/2020   PT End of Session - 03/27/20 0910    Visit Number 7    Number of Visits 12    Date for PT Re-Evaluation 05/01/20    PT Start Time 0828    PT Stop Time 0907    PT Time Calculation (min) 39 min    Activity Tolerance Patient tolerated treatment well;Treatment limited secondary to medical complications (Comment)   PA requested to see pt due to PT observations   Behavior During Therapy [facility] for tasks assessed/performed           Past Medical History:  Diagnosis Date  . Arthritis   . Asthma    uses albuterol inh prn  . Diabetes mellitus without complication ([facility])    NIDDM  . PONV (postoperative nausea and vomiting)   . Raynaud's disease   . Sleep apnea 2013   sleep study 2013 at [facility], didnt finish study, left early    Past Surgical History:  Procedure Laterality Date  . ACHILLES TENDON SURGERY Right 08/01/2019   Procedure: RIGHT REPAIR ACHILLES TENDON AVULSION;  Surgeon: [name], MD;  Location: [place];  Service: Orthopedics;  Laterality: Right;  . BACK SURGERY     cervical neck fusion 2002  . FOOT SURGERY Left    topaz procedure  . MASS EXCISION Left 09/02/2012   Procedure: MINOR EXCISION OF CYST LEFT RING FINGER A-3 PULLEY;  Surgeon: Cammie Sickle., MD;  Location: [place];  Service: Orthopedics;  Laterality: Left;  . neck fusion  2002  . OOPHORECTOMY    . right hand surgery  2003  . WISDOM TOOTH EXTRACTION    . WRIST SURGERY Right    TFCC repair 2003    There were no vitals filed for this visit.   Subjective Assessment - 03/27/20 0830    Subjective ankle is more sore and painful today -  thinks she overdid it yesterday at work.    Limitations Standing;Walking    How long can you stand comfortably? 20-30 min    Patient Stated Goals improve pain, swelling, strength    Currently in Pain? Yes    Pain Score 8     Pain Location Ankle    Pain Orientation Right    Pain Descriptors / Indicators Aching;Dull;Throbbing;Tightness    Pain Type Chronic pain;Surgical pain    Pain Onset More than a month ago    Pain Frequency Constant    Aggravating Factors  standing, walking    Pain Relieving Factors meds, elevation                             [facility] Adult PT Treatment/Exercise - 03/27/20 0831      Manual Therapy   Manual therapy comments IASTM to Rt achilles/gastroc - very sensitive      Ankle Exercises: Aerobic   Nustep L5 x 8 min      Ankle Exercises: Stretches   Slant Board Stretch 3 reps;30 seconds   very gentle     Ankle Exercises: Standing   SLS on level ground 5x10 sec; RLE; light toe touch on Lt with 2# med ball  in hand reaching overhead x 10-15 reps    Other Standing Ankle Exercises step ups forward/lateral with Rt onto 8" step x10, forward with Lt x 5 - unable to continue due to hip pain                    PT Short Term Goals - 02/02/20 1010      PT SHORT TERM GOAL #1   Title She will be independent with initial  HEP     Status Achieved    Target Date 01/25/20      PT SHORT TERM GOAL #2   Title improve Rt ankle AROM by at least 5 degrees all motions for improved function    Status Achieved    Target Date 01/25/20             PT Long Term Goals - 03/20/20 1132      PT LONG TERM GOAL #1   Title She will be independent with all HEP issued as of last visit    Baseline 11/9: new exercises given today    Status On-going    Target Date 05/01/20      PT LONG TERM GOAL #2   Title report ability to work 4-8 hours with pain < 4/10 for improved function and mobility    Baseline 11/9: still only working up to 4 hours, pain up to  8/10    Status On-going    Target Date 05/01/20      PT LONG TERM GOAL #3   Title demonstrate 3/5 plantarflexion strength for improved function    Baseline 11/9: still 1/5 strength    Status On-going    Target Date 05/01/20      PT LONG TERM GOAL #4   Title demonstrate improved strength and balance by performing RLE SLS > 10 sec    Baseline 11/9: 4 seconds    Status On-going    Target Date 05/01/20      PT LONG TERM GOAL #5   Title --                 Plan - 03/27/20 0910    Clinical Impression Statement Pt continues to have periods of elevated pain limiting progress with PT.  Tolerated session well today with slight increase in pain as expected with activity.  Overall will continue to benefit from PT to maximize function.    Personal Factors and Comorbidities Age;Comorbidity 3+;Past/Current Experience;Fitness;Time since onset of injury/illness/exacerbation    Comorbidities arthritis, asthma, DM, obesity    Examination-Activity Limitations Lift;Stand;Locomotion Level;Transfers;Squat;Stairs;Carry    Examination-Participation Restrictions Community Activity;Occupation;Shop    Stability/Clinical Decision Making Evolving/Moderate complexity    Rehab Potential Good    PT Frequency 1x / week    PT Duration 6 weeks    PT Treatment/Interventions ADLs/Self Care Home Management;Cryotherapy;Electrical Stimulation;Ultrasound;Iontophoresis 4mg /ml Dexamethasone;Gait training;Stair training;Functional mobility training;Moist Heat;Therapeutic activities;Balance training;Neuromuscular re-education;Therapeutic exercise;Patient/family education;Scar mobilization;Manual techniques;Passive range of motion;Dry needling;Taping;Vasopneumatic Device    PT Next Visit Plan work on balance/strengthening, manual/modalities PRN for pain/ROM    PT Home Exercise Plan Access Code: [email or web address]    Consulted and Agree with Plan of Care Patient           Patient will benefit from skilled therapeutic  intervention in order to improve the following deficits and impairments:  Abnormal gait, Increased fascial restricitons, Pain, Decreased scar mobility, Decreased mobility, Decreased activity tolerance, Decreased balance, Decreased range of motion, Decreased strength, Impaired flexibility, Increased edema, Difficulty walking  Visit Diagnosis:  Pain in right ankle and joints of right foot  Muscle weakness (generalized)  Localized edema  Difficulty in walking, not elsewhere classified     Problem List Patient Active Problem List   Diagnosis Date Noted  . S/P Achilles tendon repair 03/13/2020  . Rupture of right Achilles tendon 08/01/2019  . Achilles rupture, right 07/21/2019  . Class 3 obesity with body mass index (BMI) of 50.0 to 59.9 in adult 01/29/2017  . Polyclonal gammopathy determined by serum protein electrophoresis 08/21/2016  . MGUS (monoclonal gammopathy of unknown significance) 08/18/2016  . History of asthma 07/28/2016  . Raynaud's disease without gangrene 07/24/2016  . Primary osteoarthritis of both knees 07/24/2016  . DJD (degenerative joint disease), cervical 07/24/2016  . History of gastroesophageal reflux (GERD) 07/24/2016  . ANA positive 07/24/2016  . Trochanteric bursitis of both hips 07/24/2016  . Hyperlipidemia 06/18/2013  . Hematuria 06/18/2013     Laureen Abrahams, PT, DPT 03/27/20 9:13 AM    [facility] [address] [place], [place], [address] Phone: [phone]   Fax:  [phone]  Name: Miranda Ross MRN: [id number] Date of Birth: 1975-05-13      PHYSICAL THERAPY DISCHARGE SUMMARY  Visits from Start of Care: 7  Current functional level related to goals / functional outcomes: See above   Remaining deficits: See above; pt with fall and humerus fx   Education / Equipment: HEP  Plan: Patient agrees to discharge.  Patient goals were not met. Patient is being discharged due to a change in medical  status.  ?????    Laureen Abrahams, PT, DPT 05/14/20 11:05 AM [facility] [address] [place], [place], [address] Phone: [phone]   Fax:  [phone]

## 2020-04-02 ENCOUNTER — Encounter: Payer: Self-pay | Admitting: Orthopaedic Surgery

## 2020-04-02 NOTE — Progress Notes (Deleted)
Office Visit Note  Patient: Miranda Ross             Date of Birth: 1975/07/03           MRN: [id number]             PCP: Everardo Beals, NP Referring: Everardo Beals, NP Visit Date: 04/16/2020 Occupation: @GUAROCC @  Subjective:  No chief complaint on file.   History of Present Illness: Miranda Ross is a 44 y.o. female ***   Activities of Daily Living:  Patient reports morning stiffness for *** {minute/hour:19697}.   Patient {ACTIONS;DENIES/REPORTS:21021675::"Denies"} nocturnal pain.  Difficulty dressing/grooming: {ACTIONS;DENIES/REPORTS:21021675::"Denies"} Difficulty climbing stairs: {ACTIONS;DENIES/REPORTS:21021675::"Denies"} Difficulty getting out of chair: {ACTIONS;DENIES/REPORTS:21021675::"Denies"} Difficulty using hands for taps, buttons, cutlery, and/or writing: {ACTIONS;DENIES/REPORTS:21021675::"Denies"}  No Rheumatology ROS completed.   PMFS History:  Patient Active Problem List   Diagnosis Date Noted  . S/P Achilles tendon repair 03/13/2020  . Rupture of right Achilles tendon 08/01/2019  . Achilles rupture, right 07/21/2019  . Class 3 obesity with body mass index (BMI) of 50.0 to 59.9 in adult 01/29/2017  . Polyclonal gammopathy determined by serum protein electrophoresis 08/21/2016  . MGUS (monoclonal gammopathy of unknown significance) 08/18/2016  . History of asthma 07/28/2016  . Raynaud's disease without gangrene 07/24/2016  . Primary osteoarthritis of both knees 07/24/2016  . DJD (degenerative joint disease), cervical 07/24/2016  . History of gastroesophageal reflux (GERD) 07/24/2016  . ANA positive 07/24/2016  . Trochanteric bursitis of both hips 07/24/2016  . Hyperlipidemia 06/18/2013  . Hematuria 06/18/2013    Past Medical History:  Diagnosis Date  . Arthritis   . Asthma    uses albuterol inh prn  . Diabetes mellitus without complication ([facility])    NIDDM  . PONV (postoperative nausea and vomiting)   . Raynaud's disease   . Sleep  apnea 2013   sleep study 2013 at [place], didnt finish study, left early    Family History  Problem Relation Age of Onset  . Diabetes Mother   . Heart disease Mother   . Depression Mother   . Diverticulosis Mother   . Non-Hodgkin's lymphoma Father   . Scleroderma Sister    Past Surgical History:  Procedure Laterality Date  . ACHILLES TENDON SURGERY Right 08/01/2019   Procedure: RIGHT REPAIR ACHILLES TENDON AVULSION;  Surgeon: [name], MD;  Location: [place];  Service: Orthopedics;  Laterality: Right;  . BACK SURGERY     cervical neck fusion 2002  . FOOT SURGERY Left    topaz procedure  . MASS EXCISION Left 09/02/2012   Procedure: MINOR EXCISION OF CYST LEFT RING FINGER A-3 PULLEY;  Surgeon: Cammie Sickle., MD;  Location: [place];  Service: Orthopedics;  Laterality: Left;  . neck fusion  2002  . OOPHORECTOMY    . right hand surgery  2003  . WISDOM TOOTH EXTRACTION    . WRIST SURGERY Right    TFCC repair 2003   Social History   Social History Narrative  . Not on file    There is no immunization history on file for this patient.   Objective: Vital Signs: There were no vitals taken for this visit.   Physical Exam   Musculoskeletal Exam: ***  CDAI Exam: CDAI Score: -- Patient Global: --; Provider Global: -- Swollen: --; Tender: -- Joint Exam 04/16/2020   No joint exam has been documented for this visit   There is currently no information documented on the homunculus. Go to the Rheumatology  activity and complete the homunculus joint exam.  Investigation: No additional findings.  Imaging: No results found.  Recent Labs: Lab Results  Component Value Date   WBC 14.7 (H) 08/02/2019   HGB 12.0 08/02/2019   PLT 342 08/02/2019   NA 137 08/02/2019   K 4.4 08/02/2019   CL 104 08/02/2019   CO2 23 08/02/2019   GLUCOSE 201 (H) 08/02/2019   BUN 13 08/02/2019   CREATININE 0.64 08/02/2019   BILITOT 0.4 08/01/2019   ALKPHOS 97 08/01/2019    AST 23 08/01/2019   ALT 16 08/01/2019   PROT 7.4 08/01/2019   ALBUMIN 2.6 (L) 08/01/2019   CALCIUM 8.7 (L) 08/02/2019   GFRAA >60 08/02/2019    Speciality Comments: No specialty comments available.  Procedures:  No procedures performed Allergies: Patient has no known allergies.   Assessment / Plan:     Visit Diagnoses: No diagnosis found.  Orders: No orders of the defined types were placed in this encounter.  No orders of the defined types were placed in this encounter.   Face-to-face time spent with patient was *** minutes. Greater than 50% of time was spent in counseling and coordination of care.  Follow-Up Instructions: No follow-ups on file.   Ofilia Neas, PA-C  Note - This record has been created using Dragon software.  Chart creation errors have been sought, but may not always  have been located. Such creation errors do not reflect on  the standard of medical care.

## 2020-04-04 ENCOUNTER — Other Ambulatory Visit: Payer: Self-pay

## 2020-04-04 ENCOUNTER — Encounter: Payer: Self-pay | Admitting: Orthopaedic Surgery

## 2020-04-04 ENCOUNTER — Ambulatory Visit (INDEPENDENT_AMBULATORY_CARE_PROVIDER_SITE_OTHER): Payer: 59 | Admitting: Orthopaedic Surgery

## 2020-04-04 DIAGNOSIS — M25511 Pain in right shoulder: Secondary | ICD-10-CM | POA: Diagnosis not present

## 2020-04-04 DIAGNOSIS — S43001A Unspecified subluxation of right shoulder joint, initial encounter: Secondary | ICD-10-CM

## 2020-04-04 NOTE — Addendum Note (Signed)
Addended by: Meyer Cory on: 04/04/2020 09:13 AM   Modules accepted: Orders

## 2020-04-04 NOTE — Progress Notes (Signed)
Office Visit Note   Patient: Miranda Ross           Date of Birth: 28-Apr-1976           MRN: [id number] Visit Date: 04/04/2020              Requested by: Everardo Beals, NP [place],  [place] [address] PCP: Everardo Beals, NP   Assessment & Plan: Visit Diagnoses:  1. Acute pain of right shoulder   2. Subluxation of right shoulder joint, initial encounter     Plan: Initial images at urgent care were read as evulsion fracture of humeral head.  She appears to have more indention and may have some calcific tendinopathy of the rotator cuff with impaction injury to the humeral head against the acromion.  She does have some spurring at inferior aspect of the glenoid but no definite bony Bankart lesion is seen.  She is having severe shoulder pain and MRI scan is indicated for evaluation of impaction fracture versus complete rotator cuff tear versus Bankart lesion from her subluxation relocation.  Office follow-up after MRI scan.  Follow-Up Instructions: No follow-ups on file.   Orders:  No orders of the defined types were placed in this encounter.  No orders of the defined types were placed in this encounter.     Procedures: No procedures performed   Clinical Data: No additional findings.   Subjective: Chief Complaint  Patient presents with  . Right Arm - Pain    FALL 04/02/2020    HPI 44 year old female returns [place] for right Achilles tendon evulsion repair in March 2021.  She states she thinks she caught her hook her left foot on a tote bag causing her to fall when she went down she states her right arm was outstretched and went out behind her and then internal rotation rather than external rotation.  She states she felt a definite subluxation relocation which was extremely painful.  She had x-rays obtained at [facility] which showed some calcification of the rotator cuff some irregularity of the dome of the humeral head  acromioclavicular degenerative changes and acromial spur.  Review of Systems review of systems unchanged from previous visits other than as mentioned in HPI.   Objective: Vital Signs: Ht 5\' 3"  (1.6 m)   Wt 294 lb (133.4 kg)   BMI 52.08 kg/m   Physical Exam Constitutional:      Appearance: She is well-developed.  HENT:     Head: Normocephalic.     Right Ear: External ear normal.     Left Ear: External ear normal.  Eyes:     Pupils: Pupils are equal, round, and reactive to light.  Neck:     Thyroid: No thyromegaly.     Trachea: No tracheal deviation.  Cardiovascular:     Rate and Rhythm: Normal rate.  Pulmonary:     Effort: Pulmonary effort is normal.  Abdominal:     Palpations: Abdomen is soft.  Skin:    General: Skin is warm and dry.  Neurological:     Mental Status: She is alert and oriented to person, place, and time.  Psychiatric:        Behavior: Behavior normal.     Ortho Exam patient has extreme pain with any attempts at range of motion more than 5 degrees internal and external rotation of her shoulder or with abduction.  Axillary sensation laterally over the deltoid is intact.  Cessation of the fingertips hands  radial median and ulnar nerve is intact.  No ecchymosis.  She is tender over the [facility] joint long of the biceps is tender but no distal biceps muscle migration.  Specialty Comments:  No specialty comments available.  Imaging: No results found.   PMFS History: Patient Active Problem List   Diagnosis Date Noted  . Pain in right shoulder 04/04/2020  . Shoulder subluxation, right 04/04/2020  . S/P Achilles tendon repair 03/13/2020  . Rupture of right Achilles tendon 08/01/2019  . Achilles rupture, right 07/21/2019  . Class 3 obesity with body mass index (BMI) of 50.0 to 59.9 in adult 01/29/2017  . Polyclonal gammopathy determined by serum protein electrophoresis 08/21/2016  . MGUS (monoclonal gammopathy of unknown significance) 08/18/2016  . History of  asthma 07/28/2016  . Raynaud's disease without gangrene 07/24/2016  . Primary osteoarthritis of both knees 07/24/2016  . DJD (degenerative joint disease), cervical 07/24/2016  . History of gastroesophageal reflux (GERD) 07/24/2016  . ANA positive 07/24/2016  . Trochanteric bursitis of both hips 07/24/2016  . Hyperlipidemia 06/18/2013  . Hematuria 06/18/2013   Past Medical History:  Diagnosis Date  . Arthritis   . Asthma    uses albuterol inh prn  . Diabetes mellitus without complication (HCC)    NIDDM  . PONV (postoperative nausea and vomiting)   . Raynaud's disease   . Sleep apnea 2013   sleep study 2013 at [facility], didnt finish study, left early    Family History  Problem Relation Age of Onset  . Diabetes Mother   . Heart disease Mother   . Depression Mother   . Diverticulosis Mother   . Non-Hodgkin's lymphoma Father   . Scleroderma Sister     Past Surgical History:  Procedure Laterality Date  . ACHILLES TENDON SURGERY Right 08/01/2019   Procedure: RIGHT REPAIR ACHILLES TENDON AVULSION;  Surgeon: [name], MD;  Location: [facility];  Service: Orthopedics;  Laterality: Right;  . BACK SURGERY     cervical neck fusion 2002  . FOOT SURGERY Left    topaz procedure  . MASS EXCISION Left 09/02/2012   Procedure: MINOR EXCISION OF CYST LEFT RING FINGER A-3 PULLEY;  Surgeon: Cammie Sickle., MD;  Location: [facility];  Service: Orthopedics;  Laterality: Left;  . neck fusion  2002  . OOPHORECTOMY    . right hand surgery  2003  . WISDOM TOOTH EXTRACTION    . WRIST SURGERY Right    TFCC repair 2003   Social History   Occupational History  . Not on file  Tobacco Use  . Smoking status: Never Smoker  . Smokeless tobacco: Never Used  Vaping Use  . Vaping Use: Never used  Substance and Sexual Activity  . Alcohol use: No  . Drug use: No  . Sexual activity: Yes    Birth control/protection: I.U.D.    Comment: Mirena IUD placed 2016

## 2020-04-09 ENCOUNTER — Telehealth: Payer: Self-pay | Admitting: Orthopaedic Surgery

## 2020-04-09 NOTE — Telephone Encounter (Signed)
Note entered

## 2020-04-09 NOTE — Telephone Encounter (Signed)
Pt would like a note so she can go back to work under the restrictions Dr.Yates already gave her job in regards to the torn rotator cuff; pt would like to have this uploaded to [employer].

## 2020-04-09 NOTE — Telephone Encounter (Signed)
OK note for RTW with right arm in sling.  She said she can print it from my chart . thanks

## 2020-04-09 NOTE — Telephone Encounter (Signed)
Ok for note 

## 2020-04-10 ENCOUNTER — Encounter: Payer: 59 | Admitting: Physical Therapy

## 2020-04-12 ENCOUNTER — Telehealth: Payer: Self-pay | Admitting: Radiology

## 2020-04-12 ENCOUNTER — Other Ambulatory Visit: Payer: Self-pay | Admitting: Orthopaedic Surgery

## 2020-04-12 NOTE — Telephone Encounter (Signed)
Declined , I called. 540 tabs total since 06/13/2019. Use ice, etc.

## 2020-04-12 NOTE — Telephone Encounter (Signed)
Patient called and states that the hydrocodone that was being sent in by the Urgent Care was never received.  She requests you send rx for hydrocodone to [facility] in [place].  Please advise.

## 2020-04-13 NOTE — Telephone Encounter (Signed)
noted 

## 2020-04-15 ENCOUNTER — Ambulatory Visit
Admission: RE | Admit: 2020-04-15 | Discharge: 2020-04-15 | Disposition: A | Discharge status: Home / Self Care | Payer: 59 | Source: Ambulatory Visit | Attending: Orthopaedic Surgery | Admitting: Orthopaedic Surgery

## 2020-04-15 DIAGNOSIS — M25511 Pain in right shoulder: Secondary | ICD-10-CM

## 2020-04-15 DIAGNOSIS — S43001A Unspecified subluxation of right shoulder joint, initial encounter: Secondary | ICD-10-CM

## 2020-04-16 ENCOUNTER — Other Ambulatory Visit: Payer: Self-pay | Admitting: Orthopaedic Surgery

## 2020-04-16 ENCOUNTER — Ambulatory Visit: Payer: 59 | Admitting: Physician Assistant

## 2020-04-16 DIAGNOSIS — G8929 Other chronic pain: Secondary | ICD-10-CM

## 2020-04-16 DIAGNOSIS — Z5181 Encounter for therapeutic drug level monitoring: Secondary | ICD-10-CM

## 2020-04-16 DIAGNOSIS — M5030 Other cervical disc degeneration, unspecified cervical region: Secondary | ICD-10-CM

## 2020-04-16 DIAGNOSIS — Z8639 Personal history of other endocrine, nutritional and metabolic disease: Secondary | ICD-10-CM

## 2020-04-16 DIAGNOSIS — Z8719 Personal history of other diseases of the digestive system: Secondary | ICD-10-CM

## 2020-04-16 DIAGNOSIS — G894 Chronic pain syndrome: Secondary | ICD-10-CM

## 2020-04-16 DIAGNOSIS — I7300 Raynaud's syndrome without gangrene: Secondary | ICD-10-CM

## 2020-04-16 DIAGNOSIS — M170 Bilateral primary osteoarthritis of knee: Secondary | ICD-10-CM

## 2020-04-16 DIAGNOSIS — M7062 Trochanteric bursitis, left hip: Secondary | ICD-10-CM

## 2020-04-16 DIAGNOSIS — S86011D Strain of right Achilles tendon, subsequent encounter: Secondary | ICD-10-CM

## 2020-04-16 DIAGNOSIS — Z8709 Personal history of other diseases of the respiratory system: Secondary | ICD-10-CM

## 2020-04-16 DIAGNOSIS — Z8679 Personal history of other diseases of the circulatory system: Secondary | ICD-10-CM

## 2020-04-16 DIAGNOSIS — R768 Other specified abnormal immunological findings in serum: Secondary | ICD-10-CM

## 2020-04-16 MED ORDER — HYDROCODONE-ACETAMINOPHEN 5-325 MG PO TABS: 1.0000 | ORAL_TABLET | Freq: Four times a day (QID) | ORAL | 0 refills | Status: DC | PRN

## 2020-04-24 ENCOUNTER — Encounter: Payer: Self-pay | Admitting: Orthopaedic Surgery

## 2020-04-24 ENCOUNTER — Other Ambulatory Visit: Payer: Self-pay

## 2020-04-24 ENCOUNTER — Ambulatory Visit (INDEPENDENT_AMBULATORY_CARE_PROVIDER_SITE_OTHER): Payer: 59 | Admitting: Orthopaedic Surgery

## 2020-04-24 DIAGNOSIS — S42201A Unspecified fracture of upper end of right humerus, initial encounter for closed fracture: Secondary | ICD-10-CM | POA: Diagnosis not present

## 2020-04-24 NOTE — Progress Notes (Signed)
Office Visit Note   Patient: Miranda Ross           Date of Birth: [birth year]           MRN: [id number] Visit Date: 04/24/2020              Requested by: Everardo Beals, NP [place],  [place] [address] PCP: Everardo Beals, NP   Assessment & Plan: Visit Diagnoses:  1. Closed fracture of proximal end of right humerus, unspecified fracture morphology, initial encounter     Plan: Works with him for continued 4 hours/day maximum 3 days a week.  This will continue for 1 month and I plan to recheck her in 3 weeks.  We discussed starting some physical therapy at that time for gentle range of motion but limited external rotation abduction.  I reviewed his case with Dr. Marlou Sa we discussed possibility that if she is having problems with instability she would require shoulder reconstruction procedure.  She may not require this which we have discussed in detail today we reviewed her MRI scan I gave her a copy of the report.  Follow-Up Instructions: Return in about 3 weeks (around 05/15/2020).   Orders:  No orders of the defined types were placed in this encounter.  No orders of the defined types were placed in this encounter.     Procedures: No procedures performed   Clinical Data: No additional findings.   Subjective: Chief Complaint  Patient presents with  . Right Shoulder - Pain  . Left Ankle - Pain    HPI patient returns for follow-up of Achilles tendon repair and then for 04/02/2020 with right shoulder pain and MRI scan shows that she has nondisplaced proximal humerus fracture and also has the glenoid anterior-inferior fracture involving 25% of the socket.  She remains in the shoulder immobilizer. She has intact feeling to her fingers.  She is doing 4 hours/day maximum 3 days/week. Review of Systems reviewed updated unchanged other than as mentioned HPI.   Objective: Vital Signs: BP (!) 150/97   Pulse 87   Physical Exam Constitutional:       Appearance: She is well-developed.  HENT:     Head: Normocephalic.     Right Ear: External ear normal.     Left Ear: External ear normal.  Eyes:     Pupils: Pupils are equal, round, and reactive to light.  Neck:     Thyroid: No thyromegaly.     Trachea: No tracheal deviation.  Cardiovascular:     Rate and Rhythm: Normal rate.  Pulmonary:     Effort: Pulmonary effort is normal.  Abdominal:     Palpations: Abdomen is soft.  Skin:    General: Skin is warm and dry.  Neurological:     Mental Status: She is alert and oriented to person, place, and time.  Psychiatric:        Mood and Affect: Mood and affect normal.        Behavior: Behavior normal.     Ortho Exam patient has well-healed Achilles tendon repair scar.  She still has some tenderness distally.  There is some thickening at the repair site.  She is amatory with a trace limp short stride gait.  Right shoulder is in a shoulder immobilizer.  Specialty Comments:  No specialty comments available.  Imaging: Narrative & Impression  CLINICAL DATA:  Right shoulder pain. Decreased range of motion status post fall 2 weeks ago.  EXAM: MRI OF THE  RIGHT SHOULDER WITHOUT CONTRAST  TECHNIQUE: Multiplanar, multisequence MR imaging of the shoulder was performed. No intravenous contrast was administered.  COMPARISON:  None.  FINDINGS: Rotator cuff: Severe tendinosis of the supraspinatus tendon with a small partial-thickness bursal surface tear anteriorly. Moderate tendinosis of the infraspinatus tendon. Teres minor tendon is intact. Subscapularis tendon is intact.  Muscles: No muscle atrophy or edema. No intramuscular fluid collection or hematoma.  Biceps Long Head: Intraarticular and extraarticular portions of the biceps tendon are intact.  Acromioclavicular Joint: Moderate arthropathy of the acromioclavicular joint. Type I acromion. Trace subacromial/subdeltoid bursal fluid.  Glenohumeral Joint: Large joint  effusion.  No chondral defect.  Labrum: Anterior inferior labral tear. Anterior inferior glenoid fracture involving approximately 25% of the circumference.  Bones: Comminuted fracture of the humeral head without significant displacement and severe surrounding marrow edema.  Other: No fluid collection or hematoma.  IMPRESSION: 1. Comminuted fracture of the humeral head without significant displacement and severe surrounding marrow edema. 2. Anterior inferior labral tear. Anterior inferior glenoid fracture involving approximately 25% of the circumference. 3. Severe tendinosis of the supraspinatus tendon with a small partial-thickness bursal surface tear anteriorly. 4. Moderate tendinosis of the infraspinatus tendon.   Electronically Signed   By: Kathreen Devoid   On: 04/15/2020 11:50      PMFS History: Patient Active Problem List   Diagnosis Date Noted  . Closed fracture of right proximal humerus 04/24/2020  . Pain in right shoulder 04/04/2020  . Shoulder subluxation, right 04/04/2020  . S/P Achilles tendon repair 03/13/2020  . Rupture of right Achilles tendon 08/01/2019  . Achilles rupture, right 07/21/2019  . Class 3 obesity with body mass index (BMI) of 50.0 to 59.9 in adult 01/29/2017  . Polyclonal gammopathy determined by serum protein electrophoresis 08/21/2016  . MGUS (monoclonal gammopathy of unknown significance) 08/18/2016  . History of asthma 07/28/2016  . Raynaud's disease without gangrene 07/24/2016  . Primary osteoarthritis of both knees 07/24/2016  . DJD (degenerative joint disease), cervical 07/24/2016  . History of gastroesophageal reflux (GERD) 07/24/2016  . ANA positive 07/24/2016  . Trochanteric bursitis of both hips 07/24/2016  . Hyperlipidemia 06/18/2013  . Hematuria 06/18/2013   Past Medical History:  Diagnosis Date  . Arthritis   . Asthma    uses albuterol inh prn  . Diabetes mellitus without complication (HCC)    NIDDM  . PONV  (postoperative nausea and vomiting)   . Raynaud's disease   . Sleep apnea 2013   sleep study 2013 at [facility], didnt finish study, left early    Family History  Problem Relation Age of Onset  . Diabetes Mother   . Heart disease Mother   . Depression Mother   . Diverticulosis Mother   . Non-Hodgkin's lymphoma Father   . Scleroderma Sister     Past Surgical History:  Procedure Laterality Date  . ACHILLES TENDON SURGERY Right 08/01/2019   Procedure: RIGHT REPAIR ACHILLES TENDON AVULSION;  Surgeon: [name], MD;  Location: [facility];  Service: Orthopedics;  Laterality: Right;  . BACK SURGERY     cervical neck fusion 2002  . FOOT SURGERY Left    topaz procedure  . MASS EXCISION Left 09/02/2012   Procedure: MINOR EXCISION OF CYST LEFT RING FINGER A-3 PULLEY;  Surgeon: Cammie Sickle., MD;  Location: [facility];  Service: Orthopedics;  Laterality: Left;  . neck fusion  2002  . OOPHORECTOMY    . right hand surgery  2003  . WISDOM  TOOTH EXTRACTION    . WRIST SURGERY Right    TFCC repair 2003   Social History   Occupational History  . Not on file  Tobacco Use  . Smoking status: Never Smoker  . Smokeless tobacco: Never Used  Vaping Use  . Vaping Use: Never used  Substance and Sexual Activity  . Alcohol use: No  . Drug use: No  . Sexual activity: Yes    Birth control/protection: I.U.D.    Comment: Mirena IUD placed 2016

## 2020-04-30 ENCOUNTER — Telehealth: Payer: Self-pay | Admitting: Orthopaedic Surgery

## 2020-04-30 NOTE — Telephone Encounter (Signed)
I called patient. She states that her HR director called her and said that her employer had questions in regards to why they could not pick time and days patient works. She explained that she was to work M, W, F so that she has a day in between due to pain and swelling, and that 8:30-12:30 was picked because her foot and ankle swell more as the days go on. She just wanted [place] to be aware in the instance this becomes a problem for HR and Dr. Lorin Mercy has to get more aggressive in his explanation for work.

## 2020-04-30 NOTE — Telephone Encounter (Signed)
Patient called requesting a return call from [place]. Patient did not disclose the reasoning for her call. Stated she would like a call back from [place]. Please call patient at [phone].

## 2020-05-15 ENCOUNTER — Encounter: Payer: Self-pay | Admitting: Orthopaedic Surgery

## 2020-05-15 ENCOUNTER — Ambulatory Visit (INDEPENDENT_AMBULATORY_CARE_PROVIDER_SITE_OTHER): Payer: 59 | Admitting: Orthopaedic Surgery

## 2020-05-15 ENCOUNTER — Ambulatory Visit: Payer: Self-pay

## 2020-05-15 VITALS — Ht 63.0 in | Wt 294.0 lb

## 2020-05-15 DIAGNOSIS — S42201A Unspecified fracture of upper end of right humerus, initial encounter for closed fracture: Secondary | ICD-10-CM

## 2020-05-15 NOTE — Progress Notes (Signed)
Office Visit Note   Patient: Miranda Ross           Date of Birth: 10-28-1975           MRN: [id number] Visit Date: 05/15/2020              Requested by: Marva Panda, NP [address] [place],  [place] [address] PCP: Marva Panda, NP   Assessment & Plan: Visit Diagnoses:  1. Closed fracture of proximal end of right humerus, unspecified fracture morphology, initial encounter    2. Post Achilles tendon repair, right usually Plan: Patient making progress with Achilles tendon.  Right shoulder had dislocation or subluxation with relocation with proximal humerus fracture which is healing by radiograph.  She can continue working on a pulley at home to work on flexion fingertip while walking.  Work slip given for continued 3 days/week 4 hours/day max for 3 weeks.  I will recheck her in 3 weeks and we can discuss ramping up her work activity at that time.  Follow-Up Instructions: No follow-ups on file.   Orders:  Orders Placed This Encounter  Procedures  . XR Shoulder Right   No orders of the defined types were placed in this encounter.     Procedures: No procedures performed   Clinical Data: No additional findings.   Subjective: Chief Complaint  Patient presents with  . Right Shoulder - Fracture, Follow-up    Fall 04/02/2020  . Right Ankle - Follow-up    08/01/2019 right achilles repair    HPI 45 year old female returns post fall with proximal humerus fracture right side imaged on MRI scan.  Patient had inferior glenoid injury with shoulder subluxation relocation.  She is also recovering from Achilles tendon repair 08/01/2019.  Right ankle slowly improving.  She states she would like to continue 3 days/week as long as they are not consecutive days 4 hours/day maximum for 3weeks.  Review of Systems all of the systems noncontributory.   Objective: Vital Signs: Ht 5\' 3"  (1.6 m)   Wt 294 lb (133.4 kg)   BMI 52.08 kg/m   Physical Exam Constitutional:       Appearance: She is well-developed.  HENT:     Head: Normocephalic.     Right Ear: External ear normal.     Left Ear: External ear normal.  Eyes:     Pupils: Pupils are equal, round, and reactive to light.  Neck:     Thyroid: No thyromegaly.     Trachea: No tracheal deviation.  Cardiovascular:     Rate and Rhythm: Normal rate.  Pulmonary:     Effort: Pulmonary effort is normal.  Abdominal:     Palpations: Abdomen is soft.  Skin:    General: Skin is warm and dry.  Neurological:     Mental Status: She is alert and oriented to person, place, and time.  Psychiatric:        Mood and Affect: Mood and affect normal.        Behavior: Behavior normal.     Ortho Exam patient is able to do gentle circles with the right arm.  Patient can do some gentle circles with the right proximal sensation hand is intact.  She has been avoiding external rotation as instructed.  Specialty Comments:  No specialty comments available.  Imaging: No results found.   PMFS History: Patient Active Problem List   Diagnosis Date Noted  . Closed fracture of right proximal humerus 04/24/2020  . Pain in right  shoulder 04/04/2020  . Shoulder subluxation, right 04/04/2020  . S/P Achilles tendon repair 03/13/2020  . Rupture of right Achilles tendon 08/01/2019  . Achilles rupture, right 07/21/2019  . Class 3 obesity with body mass index (BMI) of 50.0 to 59.9 in adult 01/29/2017  . Polyclonal gammopathy determined by serum protein electrophoresis 08/21/2016  . MGUS (monoclonal gammopathy of unknown significance) 08/18/2016  . History of asthma 07/28/2016  . Raynaud's disease without gangrene 07/24/2016  . Primary osteoarthritis of both knees 07/24/2016  . DJD (degenerative joint disease), cervical 07/24/2016  . History of gastroesophageal reflux (GERD) 07/24/2016  . ANA positive 07/24/2016  . Trochanteric bursitis of both hips 07/24/2016  . Hyperlipidemia 06/18/2013  . Hematuria 06/18/2013   Past  Medical History:  Diagnosis Date  . Arthritis   . Asthma    uses albuterol inh prn  . Diabetes mellitus without complication (HCC)    NIDDM  . PONV (postoperative nausea and vomiting)   . Raynaud's disease   . Sleep apnea 2013   sleep study 2013 at [facility], didnt finish study, left early    Family History  Problem Relation Age of Onset  . Diabetes Mother   . Heart disease Mother   . Depression Mother   . Diverticulosis Mother   . Non-Hodgkin's lymphoma Father   . Scleroderma Sister     Past Surgical History:  Procedure Laterality Date  . ACHILLES TENDON SURGERY Right 08/01/2019   Procedure: RIGHT REPAIR ACHILLES TENDON AVULSION;  Surgeon: [name], MD;  Location: [facility];  Service: Orthopedics;  Laterality: Right;  . BACK SURGERY     cervical neck fusion 2002  . FOOT SURGERY Left    topaz procedure  . MASS EXCISION Left 09/02/2012   Procedure: MINOR EXCISION OF CYST LEFT RING FINGER A-3 PULLEY;  Surgeon: Cammie Sickle., MD;  Location: [facility];  Service: Orthopedics;  Laterality: Left;  . neck fusion  2002  . OOPHORECTOMY    . right hand surgery  2003  . WISDOM TOOTH EXTRACTION    . WRIST SURGERY Right    TFCC repair 2003   Social History   Occupational History  . Not on file  Tobacco Use  . Smoking status: Never Smoker  . Smokeless tobacco: Never Used  Vaping Use  . Vaping Use: Never used  Substance and Sexual Activity  . Alcohol use: No  . Drug use: No  . Sexual activity: Yes    Birth control/protection: I.U.D.    Comment: Mirena IUD placed 2016

## 2020-05-21 ENCOUNTER — Telehealth: Payer: Self-pay | Admitting: Orthopaedic Surgery

## 2020-05-21 NOTE — Telephone Encounter (Signed)
I called and spoke with patient. She states that her current work restrictions are hindering them at work and she has received a call from [employer] asking if there are certain accommodations that they offer her which would allow her to increase her time at work. The would ike to know if there is something that they can provide, for example, a special chair, accommodation for standing a certain period of time and then sitting and icing for a certain period of time, etc.  Patient states that she is already sitting a lot and by the end of the four hours, half of her foot is completely numb. She also continues to have swelling. I asked patient if she felt she would be able to increase her work hours if accommodations are given and approved by Dr. Lorin Mercy and she states that "all I can do is try". Her HR is supposed to email her a form with accommodation requests listed and then patient will forward to me. She would like to wait until we see what they form entails before we involve Dr. Lorin Mercy to identify acceptable accommodations.

## 2020-05-21 NOTE — Telephone Encounter (Signed)
Patient called. Would like [employer] to call her. Has questions about her work [employer].

## 2020-05-25 ENCOUNTER — Other Ambulatory Visit: Payer: Self-pay | Admitting: Physical Medicine and Rehabilitation

## 2020-05-29 ENCOUNTER — Encounter: Payer: Self-pay | Admitting: Orthopaedic Surgery

## 2020-06-04 NOTE — Telephone Encounter (Signed)
Patient has appt tomorrow. Paperwork to be addressed at that appt.

## 2020-06-05 ENCOUNTER — Encounter: Payer: Self-pay | Admitting: Orthopaedic Surgery

## 2020-06-05 ENCOUNTER — Other Ambulatory Visit: Payer: Self-pay

## 2020-06-05 ENCOUNTER — Ambulatory Visit (INDEPENDENT_AMBULATORY_CARE_PROVIDER_SITE_OTHER): Payer: 59 | Admitting: Orthopaedic Surgery

## 2020-06-05 VITALS — Ht 63.0 in | Wt 294.0 lb

## 2020-06-05 DIAGNOSIS — S86011S Strain of right Achilles tendon, sequela: Secondary | ICD-10-CM

## 2020-06-05 DIAGNOSIS — S42201A Unspecified fracture of upper end of right humerus, initial encounter for closed fracture: Secondary | ICD-10-CM | POA: Diagnosis not present

## 2020-06-05 NOTE — Progress Notes (Signed)
Office Visit Note   Patient: Miranda Ross           Date of Birth: [birth year]           MRN: [id number] Visit Date: 06/05/2020              Requested by: Marva Panda, NP [address] [place],  [place] [address] PCP: Marva Panda, NP   Assessment & Plan: Visit Diagnoses:  1. Closed fracture of proximal end of right humerus, unspecified fracture morphology, initial encounter   2. Rupture of right Achilles tendon, sequela     Plan: Patient is given a work note for 8-hour days Monday Wednesday Friday 3 days a week x3 weeks.  After that she can go to full 8-hour days 5 days a week.  She occasionally will need to sit down and can apply some ice to her ankle.  We will start some physical therapy in [place] right shoulder range of motion post proximal humerus fracture with subluxation reduction.  She will be limited and avoid abduction and external rotation of her right shoulder.  I plan to recheck her in 1 month.  Follow-Up Instructions: Return in about 1 month (around 07/06/2020).   Orders:  Orders Placed This Encounter  Procedures  . Ambulatory referral to Physical Therapy   No orders of the defined types were placed in this encounter.     Procedures: No procedures performed   Clinical Data: No additional findings.   Subjective: Chief Complaint  Patient presents with  . Right Shoulder - Fracture, Follow-up    Fall 04/02/2020  . Right Ankle - Follow-up    08/01/2019 right achilles repair    HPI 45 year old female returns post fall with right proximal humerus fracture not initially visualized on plain radiographs are present on MRI scan with subluxation reduction wheels with small Hill-Sachs lesion and proximal humerus nondisplaced fracture.  She is also getting over Achilles tendon repair 08/01/2019.  Patient is still been working part-time days limited duty.  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  Patient states  she still has decreased range of motion of her shoulder.  She is out of her sling doing some circles and elephant swings.  She has not started formal physical therapy for her shoulder.  Review of Systems Updated unchanged.  Objective: Vital Signs: Ht 5\' 3"  (1.6 m)   Wt 294 lb (133.4 kg)   BMI 52.08 kg/m   Physical Exam Constitutional:      Appearance: She is well-developed.  HENT:     Head: Normocephalic.     Right Ear: External ear normal.     Left Ear: External ear normal.  Eyes:     Pupils: Pupils are equal, round, and reactive to light.  Neck:     Thyroid: No thyromegaly.     Trachea: No tracheal deviation.  Cardiovascular:     Rate and Rhythm: Normal rate.  Pulmonary:     Effort: Pulmonary effort is normal.  Abdominal:     Palpations: Abdomen is soft.  Skin:    General: Skin is warm and dry.  Neurological:     Mental Status: She is alert and oriented to person, place, and time.  Psychiatric:        Mood and Affect: Mood and affect normal.        Behavior: Behavior normal.     Ortho Exam patient has 40 degrees flexion 40 degrees abduction of the shoulder.  Station the hand is intact.  Specialty Comments:  No specialty comments available.  Imaging: No results found.   PMFS History: Patient Active Problem List   Diagnosis Date Noted  . Closed fracture of right proximal humerus 04/24/2020  . Pain in right shoulder 04/04/2020  . Shoulder subluxation, right 04/04/2020  . S/P Achilles tendon repair 03/13/2020  . Rupture of right Achilles tendon 08/01/2019  . Achilles rupture, right 07/21/2019  . Class 3 obesity with body mass index (BMI) of 50.0 to 59.9 in adult 01/29/2017  . Polyclonal gammopathy determined by serum protein electrophoresis 08/21/2016  . MGUS (monoclonal gammopathy of unknown significance) 08/18/2016  . History of asthma 07/28/2016  . Raynaud's disease without gangrene 07/24/2016  . Primary osteoarthritis of both knees 07/24/2016  . DJD  (degenerative joint disease), cervical 07/24/2016  . History of gastroesophageal reflux (GERD) 07/24/2016  . ANA positive 07/24/2016  . Trochanteric bursitis of both hips 07/24/2016  . Hyperlipidemia 06/18/2013  . Hematuria 06/18/2013   Past Medical History:  Diagnosis Date  . Arthritis   . Asthma    uses albuterol inh prn  . Diabetes mellitus without complication (HCC)    NIDDM  . PONV (postoperative nausea and vomiting)   . Raynaud's disease   . Sleep apnea 2013   sleep study 2013 at [facility], didnt finish study, left early    Family History  Problem Relation Age of Onset  . Diabetes Mother   . Heart disease Mother   . Depression Mother   . Diverticulosis Mother   . Non-Hodgkin's lymphoma Father   . Scleroderma Sister     Past Surgical History:  Procedure Laterality Date  . ACHILLES TENDON SURGERY Right 08/01/2019   Procedure: RIGHT REPAIR ACHILLES TENDON AVULSION;  Surgeon: Eldred Manges, MD;  Location: [facility];  Service: Orthopedics;  Laterality: Right;  . BACK SURGERY     cervical neck fusion 2002  . FOOT SURGERY Left    topaz procedure  . MASS EXCISION Left 09/02/2012   Procedure: MINOR EXCISION OF CYST LEFT RING FINGER A-3 PULLEY;  Surgeon: Wyn Forster., MD;  Location: [facility];  Service: Orthopedics;  Laterality: Left;  . neck fusion  2002  . OOPHORECTOMY    . right hand surgery  2003  . WISDOM TOOTH EXTRACTION    . WRIST SURGERY Right    TFCC repair 2003   Social History   Occupational History  . Not on file  Tobacco Use  . Smoking status: Never Smoker  . Smokeless tobacco: Never Used  Vaping Use  . Vaping Use: Never used  Substance and Sexual Activity  . Alcohol use: No  . Drug use: No  . Sexual activity: Yes    Birth control/protection: I.U.D.    Comment: Mirena IUD placed 2016

## 2020-06-13 ENCOUNTER — Telehealth: Payer: Self-pay | Admitting: Orthopaedic Surgery

## 2020-06-13 NOTE — Telephone Encounter (Signed)
Forms received from [facility]. Sent to [employer].

## 2020-06-26 ENCOUNTER — Ambulatory Visit: Payer: 59 | Admitting: Orthopaedic Surgery

## 2020-06-26 DIAGNOSIS — S42201D Unspecified fracture of upper end of right humerus, subsequent encounter for fracture with routine healing: Secondary | ICD-10-CM | POA: Diagnosis not present

## 2020-06-26 DIAGNOSIS — S43001D Unspecified subluxation of right shoulder joint, subsequent encounter: Secondary | ICD-10-CM

## 2020-06-26 DIAGNOSIS — S86011S Strain of right Achilles tendon, sequela: Secondary | ICD-10-CM | POA: Diagnosis not present

## 2020-06-26 DIAGNOSIS — S86011D Strain of right Achilles tendon, subsequent encounter: Secondary | ICD-10-CM

## 2020-06-26 NOTE — Progress Notes (Signed)
Office Visit Note   Patient: Miranda Ross           Date of Birth: 1975/06/21           MRN: [id number] Visit Date: 06/26/2020              Requested by: Everardo Beals, NP [place],  [place] [address] PCP: Everardo Beals, NP   Assessment & Plan: Visit Diagnoses:  1. Rupture of right Achilles tendon, sequela   2. Subluxation of right shoulder joint, subsequent encounter   3. Closed fracture of proximal end of right humerus with routine healing, unspecified fracture morphology, subsequent encounter     Plan: We congratulated on her weight loss.  She will continue to work on a walking program and gradually work on shoulder range of motion exercises avoiding abduction external rotation.  She is continue to work on shoulder flexion.  Recheck 1 to 2 months.  She will call if she like some formal physical therapy for her shoulder.  Follow-Up Instructions: No follow-ups on file.   Orders:  No orders of the defined types were placed in this encounter.  No orders of the defined types were placed in this encounter.     Procedures: No procedures performed   Clinical Data: No additional findings.   Subjective: Chief Complaint  Patient presents with  . Right Shoulder - Follow-up    HPI 45 year old female returns post right Achilles tendon rupture and repair with later fall and closed fracture proximal humerus nondisplaced and noted on MRI scan but not on plain radiographs.  Patient had a subluxation reduction event over shoulder with a nondisplaced greater tuberosity fracture and anterior inferior labral tear with anterior inferior glenoid fracture involving approximately 25% of the circumference.  Patient also had severe tendinosis of the supraspinatus tendon.  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  She is not on any pain medication currently.  Review of Systems 14 point system update unchanged.   She has lost 25 pounds and she is resumed doing some walking with improvement Achilles tendon and also with dieting.   Objective: Vital Signs: There were no vitals taken for this visit.  Physical Exam Constitutional:      Appearance: She is well-developed.  HENT:     Head: Normocephalic.     Right Ear: External ear normal.     Left Ear: External ear normal.  Eyes:     Pupils: Pupils are equal, round, and reactive to light.  Neck:     Thyroid: No thyromegaly.     Trachea: No tracheal deviation.  Cardiovascular:     Rate and Rhythm: Normal rate.  Pulmonary:     Effort: Pulmonary effort is normal.  Abdominal:     Palpations: Abdomen is soft.  Skin:    General: Skin is warm and dry.  Neurological:     Mental Status: She is alert and oriented to person, place, and time.  Psychiatric:        Mood and Affect: Mood and affect normal.        Behavior: Behavior normal.     Ortho Exam patient is ambulatory without limp.  Achilles tendon repair shows good ankle range of motion.  There is some thickening with Achilles tendon is healed.  Some decreased sensation sural nerve distribution right foot normal on the left.  Pulses are normal.  She can touch her fingertips to  the top of her head. Specialty Comments:  No specialty comments available.  Imaging: No results found.   PMFS History: Patient Active Problem List   Diagnosis Date Noted  . Closed fracture of right proximal humerus 04/24/2020  . Pain in right shoulder 04/04/2020  . Shoulder subluxation, right 04/04/2020  . S/P Achilles tendon repair 03/13/2020  . Rupture of right Achilles tendon 08/01/2019  . Achilles rupture, right 07/21/2019  . Class 3 obesity with body mass index (BMI) of 50.0 to 59.9 in adult 01/29/2017  . Polyclonal gammopathy determined by serum protein electrophoresis 08/21/2016  . MGUS (monoclonal gammopathy of unknown significance) 08/18/2016  . History of asthma 07/28/2016  . Raynaud's disease without  gangrene 07/24/2016  . Primary osteoarthritis of both knees 07/24/2016  . DJD (degenerative joint disease), cervical 07/24/2016  . History of gastroesophageal reflux (GERD) 07/24/2016  . ANA positive 07/24/2016  . Trochanteric bursitis of both hips 07/24/2016  . Hyperlipidemia 06/18/2013  . Hematuria 06/18/2013   Past Medical History:  Diagnosis Date  . Arthritis   . Asthma    uses albuterol inh prn  . Diabetes mellitus without complication (HCC)    NIDDM  . PONV (postoperative nausea and vomiting)   . Raynaud's disease   . Sleep apnea 2013   sleep study 2013 at [facility], didnt finish study, left early    Family History  Problem Relation Age of Onset  . Diabetes Mother   . Heart disease Mother   . Depression Mother   . Diverticulosis Mother   . Non-Hodgkin's lymphoma Father   . Scleroderma Sister     Past Surgical History:  Procedure Laterality Date  . ACHILLES TENDON SURGERY Right 08/01/2019   Procedure: RIGHT REPAIR ACHILLES TENDON AVULSION;  Surgeon: [name], MD;  Location: [facility];  Service: Orthopedics;  Laterality: Right;  . BACK SURGERY     cervical neck fusion 2002  . FOOT SURGERY Left    topaz procedure  . MASS EXCISION Left 09/02/2012   Procedure: MINOR EXCISION OF CYST LEFT RING FINGER A-3 PULLEY;  Surgeon: Cammie Sickle., MD;  Location: [facility];  Service: Orthopedics;  Laterality: Left;  . neck fusion  2002  . OOPHORECTOMY    . right hand surgery  2003  . WISDOM TOOTH EXTRACTION    . WRIST SURGERY Right    TFCC repair 2003   Social History   Occupational History  . Not on file  Tobacco Use  . Smoking status: Never Smoker  . Smokeless tobacco: Never Used  Vaping Use  . Vaping Use: Never used  Substance and Sexual Activity  . Alcohol use: No  . Drug use: No  . Sexual activity: Yes    Birth control/protection: I.U.D.    Comment: Mirena IUD placed 2016

## 2020-07-03 ENCOUNTER — Other Ambulatory Visit: Payer: Self-pay | Admitting: Physical Medicine and Rehabilitation

## 2020-07-03 MED ORDER — TRAMADOL HCL 50 MG PO TABS: 50.0000 mg | ORAL_TABLET | Freq: Two times a day (BID) | ORAL | 0 refills | Status: DC | PRN

## 2020-07-03 MED ORDER — GABAPENTIN 300 MG PO CAPS
ORAL_CAPSULE | ORAL | 0 refills | Status: DC
Start: 2020-07-03 — End: 2020-08-10

## 2020-08-09 ENCOUNTER — Other Ambulatory Visit: Payer: Self-pay | Admitting: Physical Medicine and Rehabilitation

## 2020-08-13 ENCOUNTER — Ambulatory Visit: Payer: 59 | Admitting: Physical Medicine and Rehabilitation

## 2020-08-14 ENCOUNTER — Encounter: Payer: Self-pay | Admitting: Physical Medicine and Rehabilitation

## 2020-08-14 ENCOUNTER — Encounter: Payer: 59 | Admitting: Physical Medicine and Rehabilitation

## 2020-08-14 ENCOUNTER — Other Ambulatory Visit: Payer: Self-pay

## 2020-08-14 ENCOUNTER — Encounter: Payer: 59 | Attending: Physical Medicine and Rehabilitation | Admitting: Physical Medicine and Rehabilitation

## 2020-08-14 ENCOUNTER — Ambulatory Visit: Payer: 59 | Admitting: Physical Medicine and Rehabilitation

## 2020-08-14 VITALS — BP 156/90 | HR 94 | Temp 98.0°F | Wt 282.0 lb

## 2020-08-14 DIAGNOSIS — M7552 Bursitis of left shoulder: Secondary | ICD-10-CM

## 2020-08-14 DIAGNOSIS — M7062 Trochanteric bursitis, left hip: Secondary | ICD-10-CM | POA: Insufficient documentation

## 2020-08-14 DIAGNOSIS — Z79899 Other long term (current) drug therapy: Secondary | ICD-10-CM

## 2020-08-14 DIAGNOSIS — G894 Chronic pain syndrome: Secondary | ICD-10-CM | POA: Insufficient documentation

## 2020-08-14 DIAGNOSIS — Z5181 Encounter for therapeutic drug level monitoring: Secondary | ICD-10-CM

## 2020-08-14 NOTE — Progress Notes (Signed)
Left Trochanteric bursa injection  Indication Left Trochanteric bursitis. Exam has tenderness over the greater trochanter of the hip. Pain has not responded to conservative care such as exercise therapy and oral medications. Pain interferes with sleep or with mobility Informed consent was obtained after describing risks and benefits of the procedure with the patient these include bleeding bruising and infection. Patient has signed written consent form. Patient placed in a lateral decubitus position with the affected hip superior. Point of maximal pain was palpated marked and prepped with Betadine and entered with a needle to bone contact. Needle slightly withdrawn then 1 cc celestone with 3 cc 1% lidocaine were injected. Patient tolerated procedure well. Post procedure instructions given.

## 2020-08-21 LAB — TOXASSURE SELECT,+ANTIDEPR,UR

## 2020-08-22 ENCOUNTER — Telehealth: Payer: Self-pay | Admitting: *Deleted

## 2020-08-22 ENCOUNTER — Ambulatory Visit: Payer: 59 | Admitting: Orthopaedic Surgery

## 2020-08-22 NOTE — Telephone Encounter (Signed)
Urine drug screen for this encounter is consistent for prescribed medication 

## 2020-09-05 ENCOUNTER — Ambulatory Visit (INDEPENDENT_AMBULATORY_CARE_PROVIDER_SITE_OTHER): Payer: 59 | Admitting: Orthopaedic Surgery

## 2020-09-05 ENCOUNTER — Encounter: Payer: Self-pay | Admitting: Orthopaedic Surgery

## 2020-09-05 VITALS — BP 144/86 | HR 81 | Ht 63.0 in | Wt 294.0 lb

## 2020-09-05 DIAGNOSIS — S42201D Unspecified fracture of upper end of right humerus, subsequent encounter for fracture with routine healing: Secondary | ICD-10-CM | POA: Diagnosis not present

## 2020-09-05 DIAGNOSIS — S86011S Strain of right Achilles tendon, sequela: Secondary | ICD-10-CM | POA: Diagnosis not present

## 2020-09-05 NOTE — Progress Notes (Deleted)
Office Visit Note   Patient: Miranda Ross           Date of Birth: 1975-11-10           MRN: [id number] Visit Date: 09/05/2020              Requested by: Everardo Beals, NP [place],  [place] [address] PCP: Everardo Beals, NP   Assessment & Plan: Visit Diagnoses: No diagnosis found.  Plan: ***  Follow-Up Instructions: No follow-ups on file.   Orders:  No orders of the defined types were placed in this encounter.  No orders of the defined types were placed in this encounter.     Procedures: No procedures performed   Clinical Data: No additional findings.   Subjective: Chief Complaint  Patient presents with  . Right Shoulder - Follow-up  . Right Ankle - Follow-up    HPI  Review of Systems   Objective: Vital Signs: BP (!) 144/86   Pulse 81   Ht 5\' 3"  (1.6 m)   Wt 294 lb (133.4 kg)   BMI 52.08 kg/m   Physical Exam  Ortho Exam  Specialty Comments:  No specialty comments available.  Imaging: No results found.   PMFS History: Patient Active Problem List   Diagnosis Date Noted  . Closed fracture of right proximal humerus 04/24/2020  . Pain in right shoulder 04/04/2020  . Shoulder subluxation, right 04/04/2020  . S/P Achilles tendon repair 03/13/2020  . Rupture of right Achilles tendon 08/01/2019  . Achilles rupture, right 07/21/2019  . Class 3 obesity with body mass index (BMI) of 50.0 to 59.9 in adult 01/29/2017  . Polyclonal gammopathy determined by serum protein electrophoresis 08/21/2016  . MGUS (monoclonal gammopathy of unknown significance) 08/18/2016  . History of asthma 07/28/2016  . Raynaud's disease without gangrene 07/24/2016  . Primary osteoarthritis of both knees 07/24/2016  . DJD (degenerative joint disease), cervical 07/24/2016  . History of gastroesophageal reflux (GERD) 07/24/2016  . ANA positive 07/24/2016  . Trochanteric bursitis of both hips 07/24/2016  . Hyperlipidemia 06/18/2013  . Hematuria  06/18/2013   Past Medical History:  Diagnosis Date  . Arthritis   . Asthma    uses albuterol inh prn  . Diabetes mellitus without complication (HCC)    NIDDM  . PONV (postoperative nausea and vomiting)   . Raynaud's disease   . Sleep apnea 2013   sleep study 2013 at [facility], didnt finish study, left early    Family History  Problem Relation Age of Onset  . Diabetes Mother   . Heart disease Mother   . Depression Mother   . Diverticulosis Mother   . Non-Hodgkin's lymphoma Father   . Scleroderma Sister     Past Surgical History:  Procedure Laterality Date  . ACHILLES TENDON SURGERY Right 08/01/2019   Procedure: RIGHT REPAIR ACHILLES TENDON AVULSION;  Surgeon: [name], MD;  Location: [facility];  Service: Orthopedics;  Laterality: Right;  . BACK SURGERY     cervical neck fusion 2002  . FOOT SURGERY Left    topaz procedure  . MASS EXCISION Left 09/02/2012   Procedure: MINOR EXCISION OF CYST LEFT RING FINGER A-3 PULLEY;  Surgeon: Cammie Sickle., MD;  Location: [facility];  Service: Orthopedics;  Laterality: Left;  . neck fusion  2002  . OOPHORECTOMY    . right hand surgery  2003  . WISDOM TOOTH EXTRACTION    . WRIST SURGERY Right  TFCC repair 2003   Social History   Occupational History  . Not on file  Tobacco Use  . Smoking status: Never Smoker  . Smokeless tobacco: Never Used  Vaping Use  . Vaping Use: Never used  Substance and Sexual Activity  . Alcohol use: No  . Drug use: No  . Sexual activity: Yes    Birth control/protection: I.U.D.    Comment: Mirena IUD placed 2016

## 2020-09-06 ENCOUNTER — Other Ambulatory Visit: Payer: Self-pay | Admitting: Physical Medicine and Rehabilitation

## 2020-09-06 MED ORDER — ONDANSETRON HCL 4 MG PO TABS: 4.0000 mg | ORAL_TABLET | Freq: Every day | ORAL | 1 refills | Status: AC | PRN

## 2020-09-06 NOTE — Progress Notes (Signed)
Office Visit Note   Patient: Miranda Ross           Date of Birth: 1975/12/24           MRN: [id number] Visit Date: 09/05/2020              Requested by: Everardo Beals, NP [place],  [place] [address] PCP: Everardo Beals, NP   Assessment & Plan: Visit Diagnoses:  1. Rupture of right Achilles tendon, sequela   2. Closed fracture of proximal end of right humerus with routine healing, unspecified fracture morphology, subsequent encounter     Plan: We discussed some of her increased pain may be attempts at maximizing shoulder range of motion and with fracture dislocation she has some stiffness as expected.  She can work on these exercises on her own at home.  She will follow-up if she has increased symptoms.  Follow-Up Instructions: Return if symptoms worsen or fail to improve.   Orders:  No orders of the defined types were placed in this encounter.  No orders of the defined types were placed in this encounter.     Procedures: No procedures performed   Clinical Data: No additional findings.   Subjective: Chief Complaint  Patient presents with  . Right Shoulder - Follow-up  . Right Ankle - Follow-up    HPI 45 year old female follow-up Achilles tendon repair 08/01/2019.  Fall with shoulder fracture 04/02/2020.  She has been working with therapy but basically has trouble getting past 90 degrees with her shoulder with abduction.  Had fracture dislocation of her shoulder and Dr. Marlou Sa is reviewed images.  She still has some popping in her shoulder but she is least at work full duty.  She uses Tylenol and Advil as needed.  She is walking better with her Achilles tendon repair.  Review of Systems Reviewed updated unchanged.  Objective: Vital Signs: BP (!) 144/86   Pulse 81   Ht 5\' 3"  (1.6 m)   Wt 294 lb (133.4 kg)   BMI 52.08 kg/m   Physical Exam Constitutional:      Appearance: She is well-developed.  HENT:     Head: Normocephalic.      Right Ear: External ear normal.     Left Ear: External ear normal.  Eyes:     Pupils: Pupils are equal, round, and reactive to light.  Neck:     Thyroid: No thyromegaly.     Trachea: No tracheal deviation.  Cardiovascular:     Rate and Rhythm: Normal rate.  Pulmonary:     Effort: Pulmonary effort is normal.  Abdominal:     Palpations: Abdomen is soft.  Skin:    General: Skin is warm and dry.  Neurological:     Mental Status: She is alert and oriented to person, place, and time.  Psychiatric:        Behavior: Behavior normal.     Ortho Exam crepitus with shoulder range of motion limited flexion to 90 degrees abduction 90 degrees.  Good elbow range of motion station her hand is intact.  Specialty Comments:  No specialty comments available.  Imaging: No results found.   PMFS History: Patient Active Problem List   Diagnosis Date Noted  . Closed fracture of right proximal humerus 04/24/2020  . Pain in right shoulder 04/04/2020  . Shoulder subluxation, right 04/04/2020  . S/P Achilles tendon repair 03/13/2020  . Rupture of right Achilles tendon 08/01/2019  . Achilles rupture, right 07/21/2019  . Class  3 obesity with body mass index (BMI) of 50.0 to 59.9 in adult 01/29/2017  . Polyclonal gammopathy determined by serum protein electrophoresis 08/21/2016  . MGUS (monoclonal gammopathy of unknown significance) 08/18/2016  . History of asthma 07/28/2016  . Raynaud's disease without gangrene 07/24/2016  . Primary osteoarthritis of both knees 07/24/2016  . DJD (degenerative joint disease), cervical 07/24/2016  . History of gastroesophageal reflux (GERD) 07/24/2016  . ANA positive 07/24/2016  . Trochanteric bursitis of both hips 07/24/2016  . Hyperlipidemia 06/18/2013  . Hematuria 06/18/2013   Past Medical History:  Diagnosis Date  . Arthritis   . Asthma    uses albuterol inh prn  . Diabetes mellitus without complication (HCC)    NIDDM  . PONV (postoperative nausea and  vomiting)   . Raynaud's disease   . Sleep apnea 2013   sleep study 2013 at [facility], didnt finish study, left early    Family History  Problem Relation Age of Onset  . Diabetes Mother   . Heart disease Mother   . Depression Mother   . Diverticulosis Mother   . Non-Hodgkin's lymphoma Father   . Scleroderma Sister     Past Surgical History:  Procedure Laterality Date  . ACHILLES TENDON SURGERY Right 08/01/2019   Procedure: RIGHT REPAIR ACHILLES TENDON AVULSION;  Surgeon: [name], MD;  Location: [facility];  Service: Orthopedics;  Laterality: Right;  . BACK SURGERY     cervical neck fusion 2002  . FOOT SURGERY Left    topaz procedure  . MASS EXCISION Left 09/02/2012   Procedure: MINOR EXCISION OF CYST LEFT RING FINGER A-3 PULLEY;  Surgeon: Cammie Sickle., MD;  Location: [facility];  Service: Orthopedics;  Laterality: Left;  . neck fusion  2002  . OOPHORECTOMY    . right hand surgery  2003  . WISDOM TOOTH EXTRACTION    . WRIST SURGERY Right    TFCC repair 2003   Social History   Occupational History  . Not on file  Tobacco Use  . Smoking status: Never Smoker  . Smokeless tobacco: Never Used  Vaping Use  . Vaping Use: Never used  Substance and Sexual Activity  . Alcohol use: No  . Drug use: No  . Sexual activity: Yes    Birth control/protection: I.U.D.    Comment: Mirena IUD placed 2016

## 2020-09-07 ENCOUNTER — Ambulatory Visit: Payer: 59 | Admitting: Physical Medicine and Rehabilitation

## 2020-09-07 ENCOUNTER — Other Ambulatory Visit: Payer: Self-pay | Admitting: Physical Medicine and Rehabilitation

## 2020-09-07 MED ORDER — TRAMADOL HCL 50 MG PO TABS: 50.0000 mg | ORAL_TABLET | Freq: Two times a day (BID) | ORAL | 0 refills | Status: DC | PRN

## 2020-09-14 ENCOUNTER — Ambulatory Visit: Payer: 59 | Admitting: Physical Medicine and Rehabilitation

## 2020-09-25 ENCOUNTER — Other Ambulatory Visit: Payer: Self-pay | Admitting: Physical Medicine and Rehabilitation

## 2020-09-25 ENCOUNTER — Encounter: Payer: 59 | Admitting: Physical Medicine and Rehabilitation

## 2020-09-25 DIAGNOSIS — G588 Other specified mononeuropathies: Secondary | ICD-10-CM

## 2020-10-17 ENCOUNTER — Other Ambulatory Visit: Payer: Self-pay | Admitting: Physical Medicine and Rehabilitation

## 2020-10-17 MED ORDER — GABAPENTIN 400 MG PO CAPS: 400.0000 mg | ORAL_CAPSULE | Freq: Three times a day (TID) | ORAL | 2 refills | Status: DC

## 2020-10-24 LAB — HM DIABETES EYE EXAM

## 2020-11-07 ENCOUNTER — Other Ambulatory Visit: Payer: Self-pay | Admitting: Physical Medicine and Rehabilitation

## 2020-11-07 MED ORDER — TRAMADOL HCL 50 MG PO TABS: 50.0000 mg | ORAL_TABLET | Freq: Two times a day (BID) | ORAL | 0 refills | Status: DC | PRN

## 2020-12-17 ENCOUNTER — Other Ambulatory Visit: Payer: Self-pay | Admitting: Physical Medicine and Rehabilitation

## 2020-12-17 MED ORDER — GABAPENTIN 400 MG PO CAPS: 400.0000 mg | ORAL_CAPSULE | Freq: Three times a day (TID) | ORAL | 2 refills | Status: DC

## 2020-12-28 ENCOUNTER — Encounter: Payer: Self-pay | Admitting: Physical Medicine and Rehabilitation

## 2020-12-28 ENCOUNTER — Other Ambulatory Visit: Payer: Self-pay

## 2020-12-28 ENCOUNTER — Encounter: Payer: 59 | Attending: Physical Medicine and Rehabilitation | Admitting: Physical Medicine and Rehabilitation

## 2020-12-28 VITALS — BP 138/85 | HR 91 | Temp 98.2°F | Ht 63.0 in | Wt 283.6 lb

## 2020-12-28 DIAGNOSIS — M7062 Trochanteric bursitis, left hip: Secondary | ICD-10-CM | POA: Insufficient documentation

## 2020-12-28 NOTE — Progress Notes (Signed)
Trochanteric bursa injection, left  Trochanteric bursitis. Exam has tenderness over the greater trochanter of the hip. Pain has not responded to conservative care such as exercise therapy and oral medications. Pain interferes with sleep or with mobility Informed consent was obtained after describing risks and benefits of the procedure with the patient these include bleeding bruising and infection. Patient has signed written consent form. Patient placed in a lateral decubitus position with the affected hip superior. Point of maximal pain was palpated marked and prepped with Betadine and entered with a needle to bone contact. Needle slightly withdrawn then '6mg'$  of betamethasone with 4 cc 1% lidocaine were injected. Patient tolerated procedure well. Post procedure instructions given.

## 2021-02-05 ENCOUNTER — Other Ambulatory Visit: Payer: Self-pay | Admitting: Physical Medicine and Rehabilitation

## 2021-02-05 MED ORDER — TRAMADOL HCL 50 MG PO TABS: 50.0000 mg | ORAL_TABLET | Freq: Two times a day (BID) | ORAL | 0 refills | Status: DC | PRN

## 2021-02-05 MED ORDER — GABAPENTIN 400 MG PO CAPS: 400.0000 mg | ORAL_CAPSULE | Freq: Three times a day (TID) | ORAL | 2 refills | Status: DC

## 2021-02-05 MED ORDER — FREESTYLE LIBRE 2 SENSOR MISC: 1.0000 | Freq: Every day | 3 refills | Status: DC

## 2021-02-13 ENCOUNTER — Telehealth: Payer: Self-pay | Admitting: *Deleted

## 2021-02-13 NOTE — Telephone Encounter (Addendum)
Prior auth for [employer] sensors submitted to [employer] via CoverMyMeds. Approval received through 02/13/2022. Miranda Ross notified

## 2021-03-22 ENCOUNTER — Other Ambulatory Visit: Payer: Self-pay | Admitting: Physical Medicine and Rehabilitation

## 2021-03-22 MED ORDER — TRAMADOL HCL 50 MG PO TABS: 50.0000 mg | ORAL_TABLET | Freq: Two times a day (BID) | ORAL | 0 refills | Status: DC | PRN

## 2021-03-22 MED ORDER — GABAPENTIN 400 MG PO CAPS: 400.0000 mg | ORAL_CAPSULE | Freq: Three times a day (TID) | ORAL | 2 refills | Status: DC

## 2021-03-29 ENCOUNTER — Encounter: Payer: 59 | Admitting: Physical Medicine and Rehabilitation

## 2021-05-06 ENCOUNTER — Other Ambulatory Visit: Payer: Self-pay | Admitting: Physical Medicine and Rehabilitation

## 2021-05-06 MED ORDER — TRAMADOL HCL 50 MG PO TABS: 50.0000 mg | ORAL_TABLET | Freq: Two times a day (BID) | ORAL | 0 refills | Status: DC | PRN

## 2021-05-06 MED ORDER — GABAPENTIN 400 MG PO CAPS: 400.0000 mg | ORAL_CAPSULE | Freq: Three times a day (TID) | ORAL | 2 refills | Status: DC

## 2021-05-09 ENCOUNTER — Encounter: Payer: 59 | Admitting: Physical Medicine and Rehabilitation

## 2021-05-27 ENCOUNTER — Encounter: Payer: 59 | Attending: Physical Medicine and Rehabilitation | Admitting: Physical Medicine and Rehabilitation

## 2021-06-13 ENCOUNTER — Encounter: Payer: 59 | Attending: Physical Medicine and Rehabilitation | Admitting: Physical Medicine and Rehabilitation

## 2021-06-13 ENCOUNTER — Other Ambulatory Visit: Payer: Self-pay

## 2021-06-13 ENCOUNTER — Encounter: Payer: Self-pay | Admitting: Physical Medicine and Rehabilitation

## 2021-06-13 VITALS — BP 136/81 | HR 87 | Temp 97.8°F | Ht 63.0 in | Wt 277.8 lb

## 2021-06-13 DIAGNOSIS — M7552 Bursitis of left shoulder: Secondary | ICD-10-CM | POA: Diagnosis not present

## 2021-06-13 DIAGNOSIS — M7062 Trochanteric bursitis, left hip: Secondary | ICD-10-CM | POA: Insufficient documentation

## 2021-06-13 DIAGNOSIS — M25512 Pain in left shoulder: Secondary | ICD-10-CM | POA: Diagnosis not present

## 2021-06-13 MED ORDER — OZEMPIC (0.25 OR 0.5 MG/DOSE) 2 MG/1.5ML ~~LOC~~ SOPN: 0.5000 mg | PEN_INJECTOR | SUBCUTANEOUS | 3 refills | Status: DC

## 2021-06-13 NOTE — Progress Notes (Signed)
Trochanteric bursa injection without ultrasound guidance  Indication Trochanteric bursitis. Exam has tenderness over the greater trochanter of the hip. Pain has not responded to conservative care such as exercise therapy and oral medications. Pain interferes with sleep or with mobility Informed consent was obtained after describing risks and benefits of the procedure with the patient these include bleeding bruising and infection. Patient has signed written consent form. Patient placed in a lateral decubitus position with the affected hip superior. Point of maximal pain was palpated marked and prepped with Betadine and entered with a needle to bone contact. Needle slightly withdrawn then 6mg  of betamethasone with 4 cc 1% lidocaine were injected. Patient tolerated procedure well. Post procedure instructions given.   Shoulder injection, left  Indication:Left shoulder pain not relieved by medication management and other conservative care.  Informed consent was obtained after describing risks and benefits of the procedure with the patient, this includes bleeding, bruising, infection and medication side effects. The patient wishes to proceed and has given written consent. Patient was placed in a seated position. The left shoulder was marked and prepped with betadine in the subacromial area. A 25-gauge 1-1/2 inch needle was inserted into the subacromial area. After negative draw back for blood, a solution containing 1 mL of 6 mg per ML betamethasone and 4 mL of 1% lidocaine was injected. A band aid was applied. The patient tolerated the procedure well. Post procedure instructions were given.

## 2021-06-13 NOTE — Addendum Note (Signed)
Addended by: Izora Ribas on: 06/13/2021 10:04 AM   Modules accepted: Orders

## 2021-07-16 IMAGING — MR MR SHOULDER*R* W/O CM
5 series · 35 of 40 positions shown · non-contrast
Comparison: None.

CLINICAL DATA: Right shoulder pain. Decreased range of motion
status post fall 2 weeks ago.

EXAM:
MRI OF THE RIGHT SHOULDER WITHOUT CONTRAST
TECHNIQUE: Multiplanar, multisequence MR imaging of the shoulder was performed.
No intravenous contrast was administered.

[Series 3: T2 fat-sat · axial · 4.0mm · 0.55mm/px · z∈[-39,+44]mm · 9 of 20 slices shown (1 of 3)]
[im 1/20]
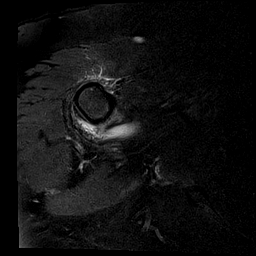
[im 3/20]
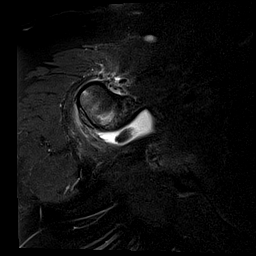
[im 5/20]
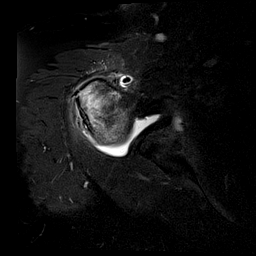
[im 8/20]
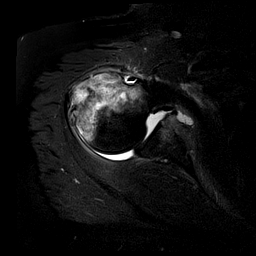
[im 10/20]
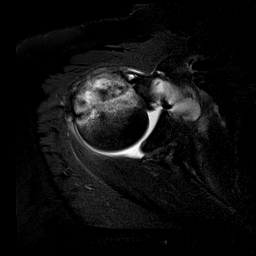
[im 12/20]
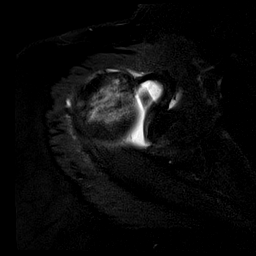
[im 15/20]
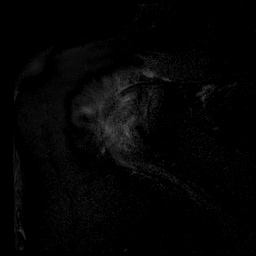
[im 17/20]
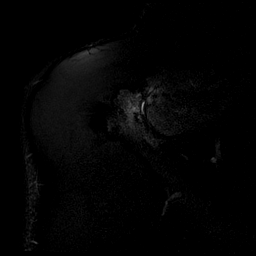
[im 20/20]
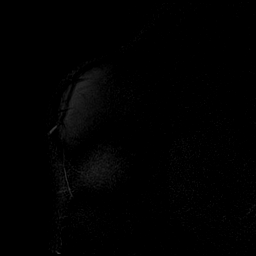

[Series 4: T2 fat-sat · coronal · 4.0mm · 0.55mm/px · 7 of 16 slices shown (2 of 3)]
[im 1/16]
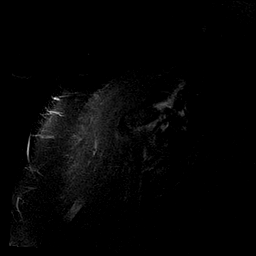
[im 3/16]
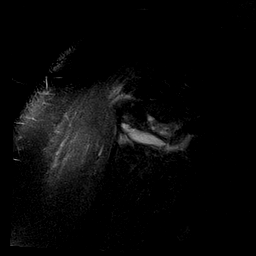
[im 6/16]
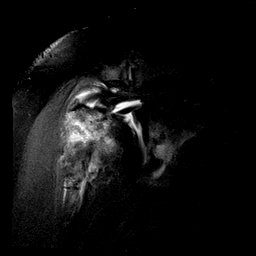
[im 8/16]
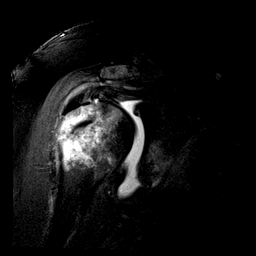
[im 11/16]
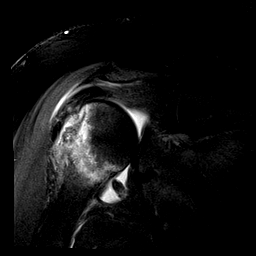
[im 13/16]
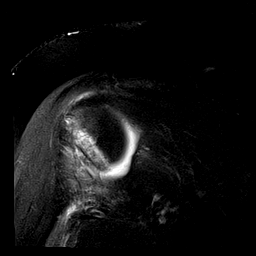
[im 16/16]
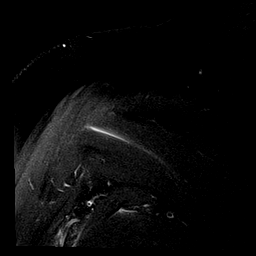

[Series 5: PD · coronal · 4.0mm · 0.27mm/px · 8 of 18 slices shown]
[im 1/18]
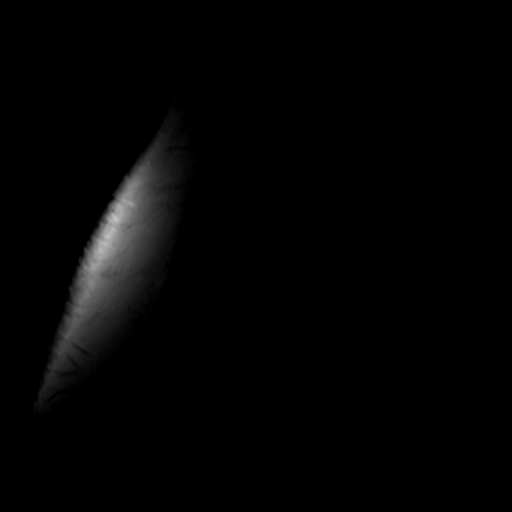
[im 3/18]
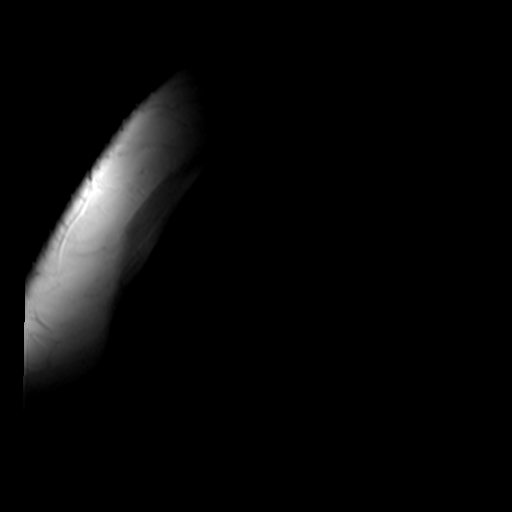
[im 5/18]
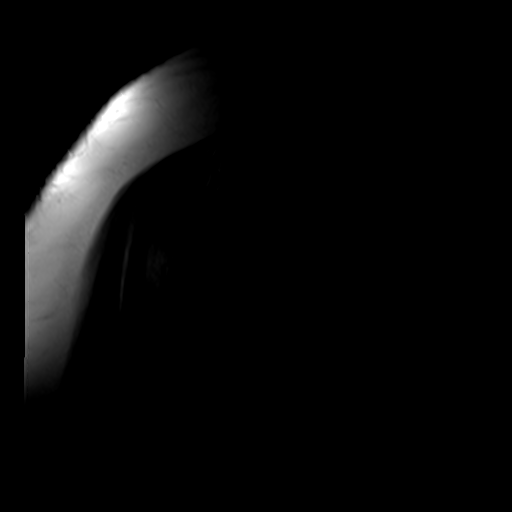
[im 8/18]
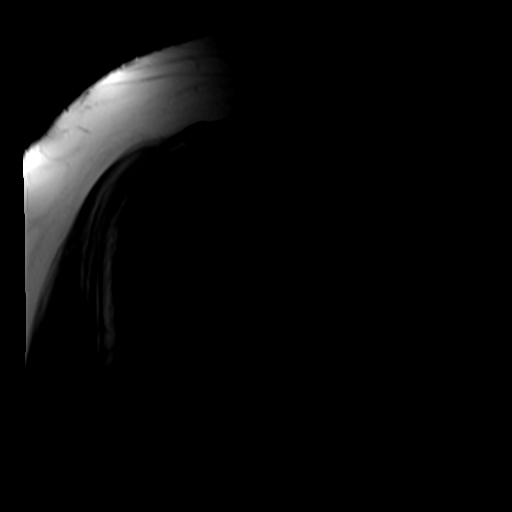
[im 10/18]
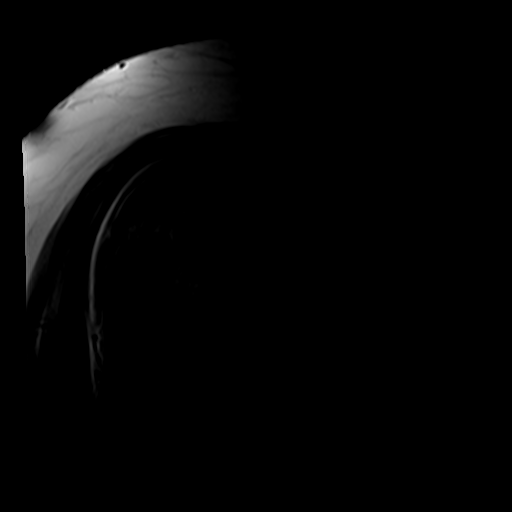
[im 13/18]
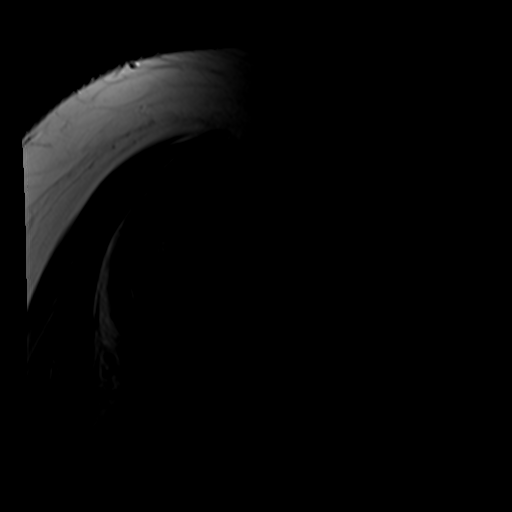
[im 15/18]
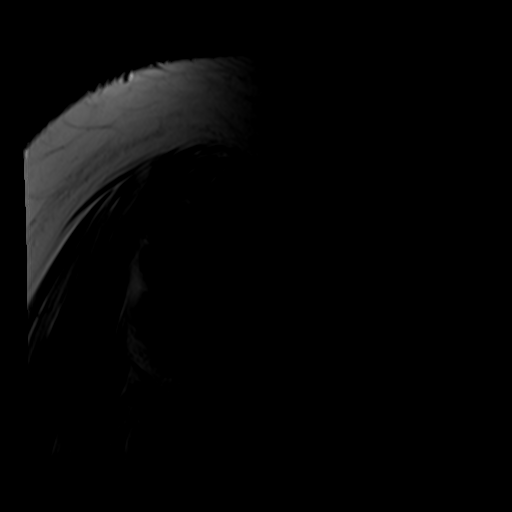
[im 18/18]
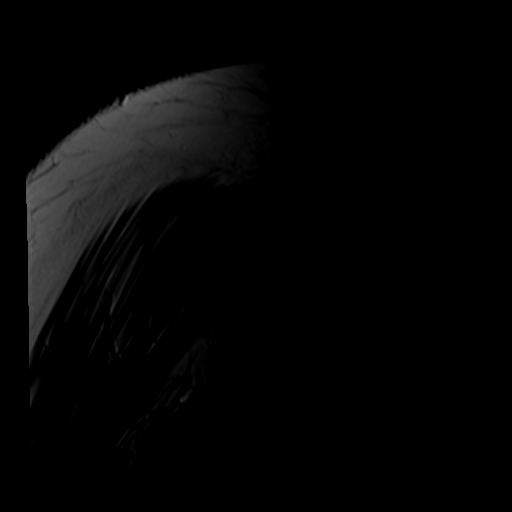

[Series 6: T2 fat-sat · oblique · 4.0mm · 0.55mm/px · 8 of 18 slices shown (3 of 3)]
[im 1/18]
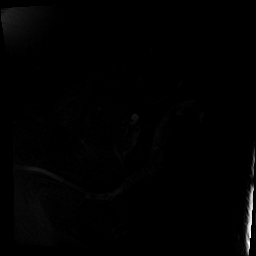
[im 3/18]
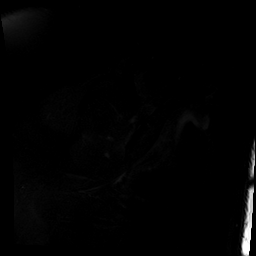
[im 5/18]
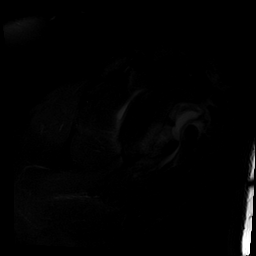
[im 8/18]
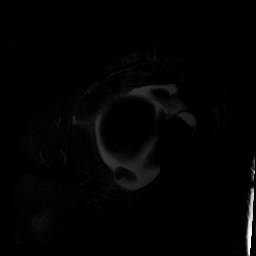
[im 10/18]
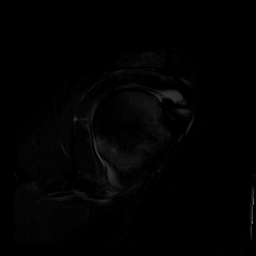
[im 13/18]
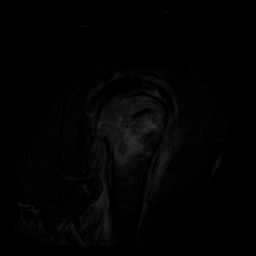
[im 15/18]
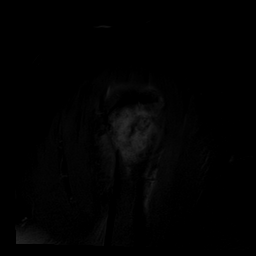
[im 18/18]
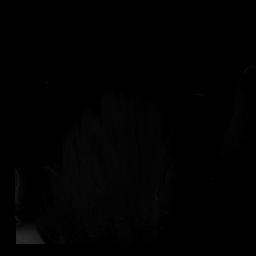

[Series 7: T1 · oblique · 4.0mm · 0.27mm/px · 3 of 18 slices shown]
[im 1/18]
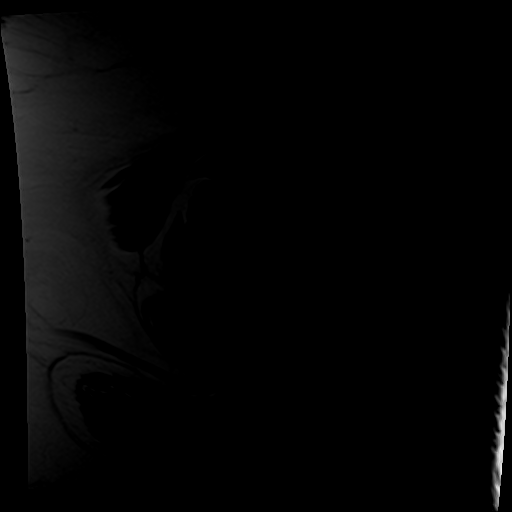
[im 3/18]
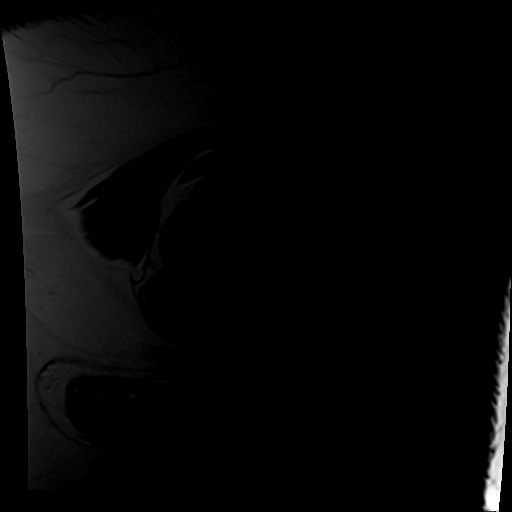
[im 5/18]
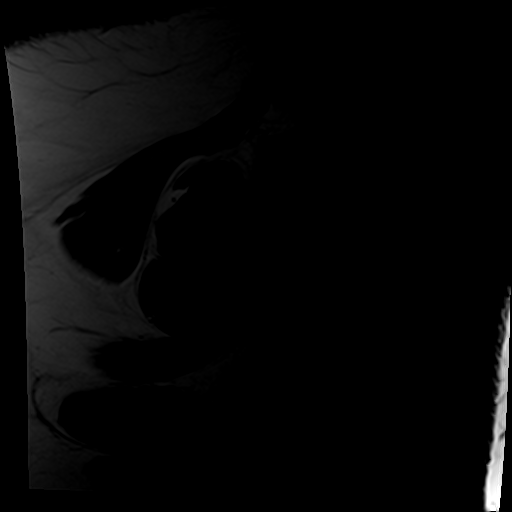

[35 of 40 positions shown; findings below may reference images not displayed]

FINDINGS: Rotator cuff: Severe tendinosis of the supraspinatus tendon with a
small partial-thickness bursal surface tear anteriorly. Moderate
tendinosis of the infraspinatus tendon. Teres minor tendon is
intact. Subscapularis tendon is intact.

Muscles: No muscle atrophy or edema. No intramuscular fluid
collection or hematoma.

Biceps Long Head: Intraarticular and extraarticular portions of the
biceps tendon are intact.

Acromioclavicular Joint: Moderate arthropathy of the
acromioclavicular joint. Type I acromion. Trace
subacromial/subdeltoid bursal fluid.

Glenohumeral Joint: Large joint effusion.  No chondral defect.

Labrum: Anterior inferior labral tear. Anterior inferior glenoid
fracture involving approximately 25% of the circumference.

Bones: Comminuted fracture of the humeral head without significant
displacement and severe surrounding marrow edema.

Other: No fluid collection or hematoma.
IMPRESSION: 1. Comminuted fracture of the humeral head without significant
displacement and severe surrounding marrow edema.
2. Anterior inferior labral tear. Anterior inferior glenoid fracture
involving approximately 25% of the circumference.
3. Severe tendinosis of the supraspinatus tendon with a small
partial-thickness bursal surface tear anteriorly.
4. Moderate tendinosis of the infraspinatus tendon.

## 2021-07-23 ENCOUNTER — Other Ambulatory Visit: Payer: Self-pay | Admitting: Physical Medicine and Rehabilitation

## 2021-07-23 MED ORDER — TRAMADOL HCL 50 MG PO TABS: 50.0000 mg | ORAL_TABLET | Freq: Two times a day (BID) | ORAL | 0 refills | Status: DC | PRN

## 2021-09-10 ENCOUNTER — Encounter: Payer: 59 | Attending: Physical Medicine and Rehabilitation | Admitting: Physical Medicine and Rehabilitation

## 2021-09-10 ENCOUNTER — Ambulatory Visit: Payer: 59 | Admitting: Physical Medicine and Rehabilitation

## 2021-09-10 ENCOUNTER — Encounter: Payer: Self-pay | Admitting: Physical Medicine and Rehabilitation

## 2021-09-10 VITALS — BP 136/89 | HR 83 | Ht 63.0 in | Wt 254.0 lb

## 2021-09-10 DIAGNOSIS — M7062 Trochanteric bursitis, left hip: Secondary | ICD-10-CM | POA: Diagnosis present

## 2021-09-10 DIAGNOSIS — G588 Other specified mononeuropathies: Secondary | ICD-10-CM | POA: Diagnosis not present

## 2021-09-10 MED ORDER — ONDANSETRON HCL 4 MG PO TABS: 4.0000 mg | ORAL_TABLET | Freq: Three times a day (TID) | ORAL | 0 refills | Status: DC | PRN

## 2021-09-10 MED ORDER — TRAMADOL HCL 50 MG PO TABS: 50.0000 mg | ORAL_TABLET | Freq: Two times a day (BID) | ORAL | 0 refills | Status: DC | PRN

## 2021-09-10 NOTE — Addendum Note (Signed)
Addended by: Izora Ribas on: 09/10/2021 10:10 AM ? ? Modules accepted: Orders ? ?

## 2021-09-10 NOTE — Progress Notes (Signed)
Left trochanteric bursa injection ?With or without ultrasound guidance ? ?Indication ?Trochanteric bursitis. Exam has tenderness over the greater trochanter of the hip. Pain has not responded to conservative care such as exercise therapy and oral medications. Pain interferes with sleep or with mobility ?Informed consent was obtained after describing risks and benefits of the procedure with the patient these include bleeding bruising and infection. Patient has signed written consent form. Patient placed in a lateral decubitus position with the affected hip superior. Point of maximal pain was palpated marked and prepped with Betadine and entered with a needle to bone contact. Needle slightly withdrawn then '6mg'$  of betamethasone with 4 cc 1% lidocaine were injected. Patient tolerated procedure well. Post procedure instructions given.  ?

## 2021-10-23 ENCOUNTER — Other Ambulatory Visit: Payer: Self-pay | Admitting: Physical Medicine and Rehabilitation

## 2021-10-23 MED ORDER — TRAMADOL HCL 50 MG PO TABS: 50.0000 mg | ORAL_TABLET | Freq: Two times a day (BID) | ORAL | 5 refills | Status: DC | PRN

## 2021-10-23 MED ORDER — GABAPENTIN 400 MG PO CAPS: 400.0000 mg | ORAL_CAPSULE | Freq: Three times a day (TID) | ORAL | 3 refills | Status: DC

## 2021-12-19 ENCOUNTER — Telehealth: Payer: Self-pay

## 2021-12-19 NOTE — Telephone Encounter (Signed)
Patient Screening Mammogram result report mailed to the home address in Epic.

## 2021-12-19 NOTE — Telephone Encounter (Signed)
[facility] report mailed.

## 2022-01-23 ENCOUNTER — Telehealth: Payer: Self-pay

## 2022-01-23 NOTE — Telephone Encounter (Signed)
PA for [employer] sent to insurance through [facility]

## 2022-01-23 NOTE — Telephone Encounter (Signed)
Request Reference Number: [id number]. [place] KIT 2 SENSOR is approved through 01/24/2023. Your patient may now fill this prescription and it will be covered.

## 2022-01-27 ENCOUNTER — Encounter: Payer: 59 | Admitting: Physical Medicine and Rehabilitation

## 2022-02-25 ENCOUNTER — Encounter: Payer: 59 | Attending: Physical Medicine and Rehabilitation | Admitting: Physical Medicine and Rehabilitation

## 2022-02-25 ENCOUNTER — Ambulatory Visit
Admission: RE | Admit: 2022-02-25 | Discharge: 2022-02-25 | Disposition: A | Discharge status: Home / Self Care | Payer: 59 | Source: Ambulatory Visit | Attending: Physical Medicine and Rehabilitation | Admitting: Physical Medicine and Rehabilitation

## 2022-02-25 ENCOUNTER — Encounter: Payer: Self-pay | Admitting: Physical Medicine and Rehabilitation

## 2022-02-25 VITALS — BP 141/85 | HR 79 | Temp 97.9°F | Ht 63.0 in | Wt 247.0 lb

## 2022-02-25 DIAGNOSIS — Z5181 Encounter for therapeutic drug level monitoring: Secondary | ICD-10-CM | POA: Insufficient documentation

## 2022-02-25 DIAGNOSIS — M25512 Pain in left shoulder: Secondary | ICD-10-CM | POA: Insufficient documentation

## 2022-02-25 DIAGNOSIS — Z79899 Other long term (current) drug therapy: Secondary | ICD-10-CM | POA: Diagnosis not present

## 2022-02-25 DIAGNOSIS — M7072 Other bursitis of hip, left hip: Secondary | ICD-10-CM | POA: Diagnosis not present

## 2022-02-25 DIAGNOSIS — G894 Chronic pain syndrome: Secondary | ICD-10-CM | POA: Insufficient documentation

## 2022-02-25 MED ORDER — LIDOCAINE HCL 1 % IJ SOLN
3.0000 mL | Freq: Once | INTRAMUSCULAR | Status: AC
  Administered 2022-02-25: 3 mL via INTRADERMAL

## 2022-02-25 MED ORDER — BETAMETHASONE SOD PHOS & ACET 6 (3-3) MG/ML IJ SUSP
6.0000 mg | Freq: Once | INTRAMUSCULAR | Status: AC
  Administered 2022-02-25: 6 mg via INTRAMUSCULAR

## 2022-02-25 NOTE — Progress Notes (Signed)
Left trochanteric bursa injection Without ultrasound guidance  Indication Trochanteric bursitis. Exam has tenderness over the greater trochanter of the hip. Pain has not responded to conservative care such as exercise therapy and oral medications. Pain interferes with sleep or with mobility Informed consent was obtained after describing risks and benefits of the procedure with the patient these include bleeding bruising and infection. Patient has signed written consent form. Patient placed in a lateral decubitus position with the affected hip superior. Point of maximal pain was palpated marked and prepped with Betadine and entered with a needle to bone contact. Needle slightly withdrawn then 6mg of betamethasone with 3 cc 1% lidocaine were injected. Patient tolerated procedure well. Post procedure instructions given.  

## 2022-02-27 ENCOUNTER — Encounter: Payer: 59 | Admitting: Physical Medicine and Rehabilitation

## 2022-02-28 ENCOUNTER — Encounter (HOSPITAL_BASED_OUTPATIENT_CLINIC_OR_DEPARTMENT_OTHER): Payer: 59 | Admitting: Physical Medicine and Rehabilitation

## 2022-02-28 DIAGNOSIS — G8929 Other chronic pain: Secondary | ICD-10-CM | POA: Diagnosis not present

## 2022-02-28 DIAGNOSIS — M25512 Pain in left shoulder: Secondary | ICD-10-CM | POA: Diagnosis not present

## 2022-02-28 LAB — TOXASSURE SELECT,+ANTIDEPR,UR

## 2022-02-28 NOTE — Progress Notes (Signed)
Subjective:    Patient ID: Miranda Ross, female    DOB: [birth year], 46 y.o.   MRN: [id number]  HPI  Miranda Ross is a 46 year woman who presents for follow-up of for left shoulder and hip bursitis.   She gets steroid injections every 6 months or longer, at least once per year.   She noticed left sided puffiness near her clavicle, also with worsening left shoulder pain  She had an Achilles rupture and this has aggravated things. She follows with Dr. Lorin Ross.   She takes Tramadol '50mg'$  BID.   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   Pain on average is 5/10.   Pain Inventory Average Pain 5 Pain Right Now 6 My pain is constant and aching  In the last 24 hours, has pain interfered with the following? General activity 6 Relation with others 0 Enjoyment of life 5 What TIME of day is your pain at its worst? evening Sleep (in general) Poor  Pain is worse with: walking and standing Pain improves with: rest, heat/ice and medication Relief from Meds: 7  use a cane how many minutes can you walk? 3-4 mins- ( In a cam-walkern boot) ability to climb steps?  yes do you drive?  yes Do you have any goals in this area?  yes    weakness numbness tingling trouble walking  New Patient  New Patient    Family History  Problem Relation Age of Onset   Diabetes Mother    Heart disease Mother    Depression Mother    Diverticulosis Mother    Non-Hodgkin's lymphoma Father    Scleroderma Sister    Social History   Socioeconomic History   Marital status: Married    Spouse name: Not on file   Number of children: Not on file   Years of education: Not on file   Highest education level: Not on file  Occupational History   Not on file  Tobacco Use   Smoking status: Never   Smokeless tobacco: Never  Vaping Use   Vaping Use: Never used  Substance and Sexual Activity   Alcohol use: No   Drug use: No   Sexual activity: Yes    Birth control/protection: I.U.D.     Comment: Mirena IUD placed 2016  Other Topics Concern   Not on file  Social History Narrative   Not on file   Social Determinants of Health   Financial Resource Strain: Not on file  Food Insecurity: Not on file  Transportation Needs: Not on file  Physical Activity: Not on file  Stress: Not on file  Social Connections: Not on file   Past Surgical History:  Procedure Laterality Date   ACHILLES TENDON SURGERY Right 08/01/2019   Procedure: [place];  Surgeon: [name], MD;  Location: [facility];  Service: Orthopedics;  Laterality: Right;   BACK SURGERY     cervical neck fusion 2002   FOOT SURGERY Left    topaz procedure   MASS EXCISION Left 09/02/2012   Procedure: MINOR EXCISION OF CYST LEFT RING FINGER A-3 PULLEY;  Surgeon: Cammie Sickle., MD;  Location: [facility];  Service: Orthopedics;  Laterality: Left;   neck fusion  2002   OOPHORECTOMY     right hand surgery  2003   WISDOM TOOTH EXTRACTION     WRIST SURGERY Right    TFCC repair 2003   Past Medical History:  Diagnosis Date  Arthritis    Asthma    uses albuterol inh prn   Diabetes mellitus without complication (HCC)    NIDDM   PONV (postoperative nausea and vomiting)    Raynaud's disease    Sleep apnea 2013   sleep study 2013 at [facility], didnt finish study, left early   There were no vitals taken for this visit.  Opioid Risk Score:   Fall Risk Score:  `1  Depression screen [facility] 2/9     02/25/2022   12:55 PM 09/10/2021    9:55 AM 12/28/2020   10:08 AM 03/09/2020    2:09 PM  Depression screen PHQ 2/9  Decreased Interest 0 0 0 0  Down, Depressed, Hopeless 0 0 0 1  PHQ - 2 Score 0 0 0 1  Altered sleeping    1  Tired, decreased energy    1  Change in appetite    0  Feeling bad or failure about yourself     0  Trouble concentrating    0  Moving slowly or fidgety/restless    0  Suicidal thoughts    0  PHQ-9 Score    3   Review of Systems  Constitutional:  Negative.   HENT: Negative.    Eyes: Negative.   Respiratory: Negative.    Cardiovascular:  Positive for leg swelling.  Gastrointestinal: Negative.   Endocrine: Negative.   Genitourinary: Negative.   Musculoskeletal:  Positive for gait problem.       Left shoulder, hips & right foot  Skin: Negative.   Allergic/Immunologic: Negative.   Hematological: Negative.   Psychiatric/Behavioral: Negative.    All other systems reviewed and are negative.      Objective:   Physical Exam Not performed     Assessment & Plan:  Miranda Ross is a 46 year old woman who presents for f/u for left shoulder and hip bursitis.  1) Left shoulder bursitis: -UDS and Pain contract obtained today. -Provided her with my cell phone number so she can call when she needs a refill of this medication. She takes '50mg'$  BID.  -discussed XR results that show acromioclavicular degeneration Discussed extracorporeal shockwave therapy as a modality for treatment. Discussed that the device looks and feels like a massage gun and I would move it over the area of pain for about 10 minutes. The device releases sound waves to the area of pain and helps to improve blood flow and circulation to improve the healing process. Discuss that this initially induces inflammation and can sometimes cause short-term increase in pain. Discussed that we typically do three weekly treatments, but sometimes up to 6 if needed, and after 6 weeks long term benefits can sometimes be achieved. Discussed that this is an FDA approved device, but not covered by insurance and would cost $60 per session. Will scheduled patient for 6 consecutive appointments and can cancel latter three if benefits are achieved after first three sessions.    2) Intercostal neuralgia, right side: -Increase gabapentin '300mg'$  TID  3) Peripheral neuropathy secondary to Type 2 DM: -Continue freestyle libre which has helped her get better control (dropped A1c by 3 points.   7 minutes  spent in discussion of extracorporeal shockwave therapy as a modality for treatment. Discussed that the device looks and feels like a massage gun and I would move it over the area of pain for about 10 minutes. The device releases sound waves to the area of pain and helps to improve blood flow and circulation to improve  the healing process. Discuss that this initially induces inflammation and can sometimes cause short-term increase in pain. Discussed that we typically do three weekly treatments, but sometimes up to 6 if needed, and after 6 weeks long term benefits can sometimes be achieved. Discussed that this is an FDA approved device, but not covered by insurance and would cost $60 per session. Will scheduled patient for 6 consecutive appointments and can cancel latter three if benefits are achieved after first three sessions.

## 2022-03-27 ENCOUNTER — Ambulatory Visit: Payer: 59 | Admitting: Physical Medicine and Rehabilitation

## 2022-05-08 ENCOUNTER — Other Ambulatory Visit: Payer: Self-pay | Admitting: Physical Medicine and Rehabilitation

## 2022-05-08 MED ORDER — OZEMPIC (1 MG/DOSE) 2 MG/1.5ML ~~LOC~~ SOPN: 1.0000 mg | PEN_INJECTOR | SUBCUTANEOUS | 3 refills | Status: DC

## 2022-05-08 MED ORDER — TRAMADOL HCL 50 MG PO TABS: 50.0000 mg | ORAL_TABLET | Freq: Two times a day (BID) | ORAL | 5 refills | Status: DC | PRN

## 2022-05-22 ENCOUNTER — Encounter: Payer: 59 | Attending: Physical Medicine and Rehabilitation | Admitting: Physical Medicine and Rehabilitation

## 2022-05-22 ENCOUNTER — Encounter: Payer: Self-pay | Admitting: Physical Medicine and Rehabilitation

## 2022-05-22 VITALS — BP 128/73 | HR 85 | Ht 63.0 in | Wt 252.0 lb

## 2022-05-22 DIAGNOSIS — M7552 Bursitis of left shoulder: Secondary | ICD-10-CM

## 2022-05-22 DIAGNOSIS — M25512 Pain in left shoulder: Secondary | ICD-10-CM | POA: Insufficient documentation

## 2022-05-22 DIAGNOSIS — G8929 Other chronic pain: Secondary | ICD-10-CM | POA: Insufficient documentation

## 2022-05-22 NOTE — Progress Notes (Signed)
Calcific tendinitis, 1st treatment  Frequency 5-10 Hz 62m Power 2000 Shocks

## 2022-05-29 ENCOUNTER — Encounter: Payer: 59 | Admitting: Physical Medicine and Rehabilitation

## 2022-05-29 ENCOUNTER — Encounter: Payer: Self-pay | Admitting: Physical Medicine and Rehabilitation

## 2022-05-29 VITALS — BP 132/84 | HR 83 | Ht 63.0 in | Wt 254.0 lb

## 2022-05-29 DIAGNOSIS — G8929 Other chronic pain: Secondary | ICD-10-CM

## 2022-05-29 DIAGNOSIS — M25512 Pain in left shoulder: Secondary | ICD-10-CM

## 2022-05-29 MED ORDER — NEOMYCIN-POLYMYXIN-HC 3.5-10000-1 OT SOLN: 3.0000 [drp] | Freq: Four times a day (QID) | OTIC | 0 refills | Status: DC

## 2022-05-29 NOTE — Addendum Note (Signed)
Addended by: Izora Ribas on: 05/29/2022 10:04 AM   Modules accepted: Orders

## 2022-05-29 NOTE — Progress Notes (Signed)
Calcific tendinitis, 2nd treatment  Frequency 5-10 Hz 22m Power 2000 Shocks

## 2022-05-30 ENCOUNTER — Ambulatory Visit (HOSPITAL_COMMUNITY)
Admission: RE | Admit: 2022-05-30 | Discharge: 2022-05-30 | Disposition: A | Discharge status: Home / Self Care | Payer: 59 | Source: Ambulatory Visit | Attending: Physical Medicine and Rehabilitation | Admitting: Physical Medicine and Rehabilitation

## 2022-05-30 DIAGNOSIS — M7552 Bursitis of left shoulder: Secondary | ICD-10-CM | POA: Diagnosis present

## 2022-06-05 ENCOUNTER — Encounter: Payer: 59 | Admitting: Physical Medicine and Rehabilitation

## 2022-06-05 ENCOUNTER — Encounter: Payer: Self-pay | Admitting: Physical Medicine and Rehabilitation

## 2022-06-05 VITALS — BP 116/80 | HR 86 | Ht 63.0 in | Wt 249.2 lb

## 2022-06-05 DIAGNOSIS — G8929 Other chronic pain: Secondary | ICD-10-CM

## 2022-06-05 DIAGNOSIS — M25512 Pain in left shoulder: Secondary | ICD-10-CM

## 2022-06-05 NOTE — Progress Notes (Signed)
Calcific tendinitis, 3rd treatment  Frequency 5-10 Hz 6m Power 2000 Shocks

## 2022-06-12 ENCOUNTER — Encounter: Payer: 59 | Attending: Physical Medicine and Rehabilitation | Admitting: Physical Medicine and Rehabilitation

## 2022-06-12 VITALS — BP 137/89 | HR 112 | Ht 63.0 in | Wt 246.2 lb

## 2022-06-12 DIAGNOSIS — G6289 Other specified polyneuropathies: Secondary | ICD-10-CM | POA: Insufficient documentation

## 2022-06-12 DIAGNOSIS — M7072 Other bursitis of hip, left hip: Secondary | ICD-10-CM | POA: Insufficient documentation

## 2022-06-12 DIAGNOSIS — G8929 Other chronic pain: Secondary | ICD-10-CM | POA: Insufficient documentation

## 2022-06-12 DIAGNOSIS — M7552 Bursitis of left shoulder: Secondary | ICD-10-CM | POA: Insufficient documentation

## 2022-06-12 DIAGNOSIS — M7712 Lateral epicondylitis, left elbow: Secondary | ICD-10-CM | POA: Insufficient documentation

## 2022-06-12 DIAGNOSIS — M25512 Pain in left shoulder: Secondary | ICD-10-CM | POA: Insufficient documentation

## 2022-06-12 NOTE — Progress Notes (Signed)
Calcific tendinitis, 4th treatment  Frequency 5-10 Hz 31m Power 2000 Shocks

## 2022-06-19 ENCOUNTER — Encounter: Payer: 59 | Admitting: Physical Medicine and Rehabilitation

## 2022-06-26 ENCOUNTER — Encounter: Payer: 59 | Admitting: Physical Medicine and Rehabilitation

## 2022-06-30 ENCOUNTER — Encounter: Payer: 59 | Admitting: Physical Medicine and Rehabilitation

## 2022-07-08 ENCOUNTER — Encounter: Payer: Self-pay | Admitting: Physical Medicine and Rehabilitation

## 2022-07-08 ENCOUNTER — Encounter: Payer: 59 | Admitting: Physical Medicine and Rehabilitation

## 2022-07-08 VITALS — BP 120/81 | HR 79 | Temp 97.9°F | Ht 63.0 in | Wt 248.6 lb

## 2022-07-08 DIAGNOSIS — M7072 Other bursitis of hip, left hip: Secondary | ICD-10-CM

## 2022-07-08 DIAGNOSIS — G6289 Other specified polyneuropathies: Secondary | ICD-10-CM

## 2022-07-08 DIAGNOSIS — M7552 Bursitis of left shoulder: Secondary | ICD-10-CM

## 2022-07-08 DIAGNOSIS — G8929 Other chronic pain: Secondary | ICD-10-CM | POA: Diagnosis present

## 2022-07-08 DIAGNOSIS — M7712 Lateral epicondylitis, left elbow: Secondary | ICD-10-CM | POA: Diagnosis present

## 2022-07-08 DIAGNOSIS — M25512 Pain in left shoulder: Secondary | ICD-10-CM | POA: Diagnosis present

## 2022-07-08 MED ORDER — BETAMETHASONE SOD PHOS & ACET 6 (3-3) MG/ML IJ SUSP
12.0000 mg | Freq: Once | INTRAMUSCULAR | Status: AC
  Administered 2022-07-08: 12 mg via INTRAMUSCULAR

## 2022-07-08 MED ORDER — LIDOCAINE HCL 1 % IJ SOLN
3.0000 mL | Freq: Once | INTRAMUSCULAR | Status: AC
  Administered 2022-07-08: 3 mL via INTRADERMAL

## 2022-07-08 NOTE — Progress Notes (Signed)
Left trochanteric bursa injection Without ultrasound guidance  Indication Trochanteric bursitis. Exam has tenderness over the greater trochanter of the hip. Pain has not responded to conservative care such as exercise therapy and oral medications. Pain interferes with sleep or with mobility Informed consent was obtained after describing risks and benefits of the procedure with the patient these include bleeding bruising and infection. Patient has signed written consent form. Patient placed in a lateral decubitus position with the affected hip superior. Point of maximal pain was palpated marked and prepped with Betadine and entered with a needle to bone contact. Needle slightly withdrawn then '6mg'$  of betamethasone with 3 cc 1% lidocaine were injected. Patient tolerated procedure well. Post procedure instructions given.

## 2022-07-08 NOTE — Progress Notes (Signed)
Subjective:    Patient ID: Miranda Ross, female    DOB: 08/25/1975, 47 y.o.   MRN: [id number]  HPI  Mrs. Miranda Ross is a 47 year woman who presents for follow-up of for left shoulder and hip bursitis, and right elbow epicondylitis.   1) Left greater trochanteric bursiits She gets steroid injections every 4-6 months or longer, at least once per year.   2) Lateral epicondylitis She has recently developed right sided elbow pain -she tried a counterforce brace and this did not help -she asks if topical voltaren gel would help  3) Left shoulder pain Much improved after ECSWT  She noticed left sided puffiness near her clavicle, also with worsening left shoulder pain  She had an Achilles rupture and this has aggravated things. She follows with Dr. Lorin Mercy.   She takes Tramadol '50mg'$  BID.   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   Pain on average is 5/10.   Pain Inventory Average Pain 5 Pain Right Now 6 My pain is constant and aching  In the last 24 hours, has pain interfered with the following? General activity 6 Relation with others 0 Enjoyment of life 5 What TIME of day is your pain at its worst? evening Sleep (in general) Poor  Pain is worse with: walking and standing Pain improves with: rest, heat/ice and medication Relief from Meds: 7  use a cane how many minutes can you walk? 3-4 mins- ( In a cam-walkern boot) ability to climb steps?  yes do you drive?  yes Do you have any goals in this area?  yes    weakness numbness tingling trouble walking  New Patient  New Patient    Family History  Problem Relation Age of Onset   Diabetes Mother    Heart disease Mother    Depression Mother    Diverticulosis Mother    Non-Hodgkin's lymphoma Father    Scleroderma Sister    Social History   Socioeconomic History   Marital status: Married    Spouse name: Not on file   Number of children: Not on file   Years of education: Not on file   Highest  education level: Not on file  Occupational History   Not on file  Tobacco Use   Smoking status: Never   Smokeless tobacco: Never  Vaping Use   Vaping Use: Never used  Substance and Sexual Activity   Alcohol use: No   Drug use: No   Sexual activity: Yes    Birth control/protection: I.U.D.    Comment: Mirena IUD placed 2016  Other Topics Concern   Not on file  Social History Narrative   Not on file   Social Determinants of Health   Financial Resource Strain: Not on file  Food Insecurity: Not on file  Transportation Needs: Not on file  Physical Activity: Not on file  Stress: Not on file  Social Connections: Not on file   Past Surgical History:  Procedure Laterality Date   ACHILLES TENDON SURGERY Right 08/01/2019   Procedure: [place];  Surgeon: [name], MD;  Location: [place];  Service: Orthopedics;  Laterality: Right;   BACK SURGERY     cervical neck fusion 2002   FOOT SURGERY Left    topaz procedure   MASS EXCISION Left 09/02/2012   Procedure: MINOR EXCISION OF CYST LEFT RING FINGER A-3 PULLEY;  Surgeon: Cammie Sickle., MD;  Location: [facility];  Service: Orthopedics;  Laterality: Left;   neck fusion  2002   OOPHORECTOMY     right hand surgery  2003   WISDOM TOOTH EXTRACTION     WRIST SURGERY Right    TFCC repair 2003   Past Medical History:  Diagnosis Date   Arthritis    Asthma    uses albuterol inh prn   Diabetes mellitus without complication ([place])    NIDDM   PONV (postoperative nausea and vomiting)    Raynaud's disease    Sleep apnea 2013   sleep study 2013 at [facility], didnt finish study, left early   BP 120/81   Pulse 79   Temp 97.9 F (36.6 C)   Ht '5\' 3"'$  (1.6 m)   Wt 248 lb 9.6 oz (112.8 kg)   SpO2 99%   BMI 44.04 kg/m   Opioid Risk Score:   Fall Risk Score:  `1  Depression screen [facility] 2/9     07/08/2022    2:52 PM 06/05/2022    9:52 AM 05/29/2022    9:51 AM 05/22/2022   10:46 AM 02/25/2022    12:55 PM 09/10/2021    9:55 AM 12/28/2020   10:08 AM  Depression screen PHQ 2/9  Decreased Interest 0 0 0 0 0 0 0  Down, Depressed, Hopeless 0 0 0 0 0 0 0  PHQ - 2 Score 0 0 0 0 0 0 0   Review of Systems  Constitutional: Negative.   HENT: Negative.    Eyes: Negative.   Respiratory: Negative.    Cardiovascular:  Positive for leg swelling.  Gastrointestinal: Negative.   Endocrine: Negative.   Genitourinary: Negative.   Musculoskeletal:  Positive for gait problem.       Left shoulder, hips & right foot  Skin: Negative.   Allergic/Immunologic: Negative.   Hematological: Negative.   Psychiatric/Behavioral: Negative.    All other systems reviewed and are negative.      Objective:   Physical Exam Gen: no distress, normal appearing HEENT: oral mucosa pink and moist, NCAT Cardio: Reg rate Chest: normal effort, normal rate of breathing Abd: soft, non-distended Ext: no edema Psych: pleasant, normal affect Skin: intact Neuro: Alert and oriented x3 MSK: TTP over left greater trochanteric bursa and right lateral elbow     Assessment & Plan:  Miranda Ross is a 47 year old woman who presents for f/u for left shoulder and hip bursitis.  1) Left shoulder bursitis: -UDS and Pain contract obtained today. Discussed positive response to ECSWT -Provided her with my cell phone number so she can call when she needs a refill of this medication. She takes '50mg'$  BID.  -discussed XR results that show acromioclavicular degeneration Discussed extracorporeal shockwave therapy as a modality for treatment. Discussed that the device looks and feels like a massage gun and I would move it over the area of pain for about 10 minutes. The device releases sound waves to the area of pain and helps to improve blood flow and circulation to improve the healing process. Discuss that this initially induces inflammation and can sometimes cause short-term increase in pain. Discussed that we typically do three weekly  treatments, but sometimes up to 6 if needed, and after 6 weeks long term benefits can sometimes be achieved. Discussed that this is an FDA approved device, but not covered by insurance and would cost $60 per session. Will scheduled patient for 6 consecutive appointments and can cancel latter three if benefits are achieved after first three sessions.    2)  Intercostal neuralgia, right side: -Increase gabapentin '300mg'$  TID  3) Peripheral neuropathy secondary to Type 2 DM: -Continue freestyle libre which has helped her get better control (dropped A1c by 3 points.   4) Right sided lateral epicondylitis: -recommended applying voltaren gel 4 times per day Discussed extracorporeal shockwave therapy as a modality for treatment. Discussed that the device looks and feels like a massage gun and I would move it over the area of pain for about 10 minutes. The device releases sound waves to the area of pain and helps to improve blood flow and circulation to improve the healing process. Discuss that this initially induces inflammation and can sometimes cause short-term increase in pain. Discussed that we typically do three weekly treatments, but sometimes up to 6 if needed, and after 6 weeks long term benefits can sometimes be achieved. Discussed that this is an FDA approved device, but not covered by insurance and would cost $60 per session. Will scheduled patient for 6 consecutive appointments and can cancel latter three if benefits are achieved after first three sessions.

## 2022-10-07 ENCOUNTER — Other Ambulatory Visit: Payer: Self-pay | Admitting: Physical Medicine and Rehabilitation

## 2022-10-07 MED ORDER — OZEMPIC (2 MG/DOSE) 8 MG/3ML ~~LOC~~ SOPN: 1.0000 | PEN_INJECTOR | SUBCUTANEOUS | 3 refills | Status: DC

## 2022-10-14 ENCOUNTER — Other Ambulatory Visit: Payer: Self-pay | Admitting: Physical Medicine and Rehabilitation

## 2022-10-14 MED ORDER — ONDANSETRON HCL 4 MG PO TABS: 4.0000 mg | ORAL_TABLET | Freq: Three times a day (TID) | ORAL | 0 refills | Status: DC | PRN

## 2022-11-03 ENCOUNTER — Encounter: Payer: Self-pay | Admitting: Physical Medicine and Rehabilitation

## 2022-11-04 ENCOUNTER — Other Ambulatory Visit: Payer: Self-pay | Admitting: Physical Medicine and Rehabilitation

## 2022-11-05 NOTE — Progress Notes (Unsigned)
 Subjective:    Patient ID: Miranda Ross, female    DOB: 12/11/1972, 47 y.o.   MRN: [id number]  HPI Mrs. New is a 47 year old female who presents for follow-up of of left arm pain.  1) Left arm pain: -she is hemiplegic -average pain is 5/10 -her husband is concerned that the shoulder is dislocated -she takes tylenol and this helps a lot  2) Insomnia -2/2 to pain in left shoulder  3) Impaired mobility: -husband is helping her  3) Vision blurry: -has an appointment with the eye doctor this month  4) Depression with suicidal ideation -most severe when her pain is severe   Pain Inventory Average Pain 5 Pain Right Now 7 My pain is constant and burning  LOCATION OF PAIN  left arm, left hand, left shoulder  BOWEL Number of stools per week: 7 Oral laxative use  Type of laxative stool softener    BLADDER Normal    Mobility use a wheelchair needs help with transfers Do you have any goals in this area?  yes  Function I need assistance with the following:  feeding, dressing, bathing, toileting, meal prep, household duties, and shopping Do you have any goals in this area?  yes  Neuro/Psych weakness trouble walking spasms depression suicidal thoughts  Prior Studies Any changes since last visit?  no  Physicians involved in your care Any changes since last visit?  no   Family History  Problem Relation Age of Onset   Stroke Other        Neg Hx   CAD Other        Neg Hx   Diabetes Other        Neg Hx   Social History   Socioeconomic History   Marital status: Married    Spouse name: Not on file   Number of children: 4   Years of education: none   Highest education level: Not on file  Occupational History   Occupation: n/a  Tobacco Use   Smoking status: Every Day    Packs/day: 0.20    Years: 32.00    Total pack years: 6.40    Types: Cigarettes    Last attempt to quit: 11/09/2013    Years since quitting: 8.6   Smokeless tobacco: Never  Vaping Use    Vaping Use: Every day   Substances: Nicotine  Substance and Sexual Activity   Alcohol use: No    Alcohol/week: 0.0 standard drinks of alcohol   Drug use: No   Sexual activity: Not on file  Other Topics Concern   Not on file  Social History Narrative   Patient lives at home with her husband.      Social Determinants of Health   Financial Resource Strain: Medium Risk (04/29/2022)   Overall Financial Resource Strain (CARDIA)    Difficulty of Paying Living Expenses: Somewhat hard  Food Insecurity: Food Insecurity Present (04/29/2022)   Hunger Vital Sign    Worried About Running Out of Food in the Last Year: Sometimes true    Ran Out of Food in the Last Year: Sometimes true  Transportation Needs: No Transportation Needs (04/29/2022)   PRAPARE - Transportation    Lack of Transportation (Medical): No    Lack of Transportation (Non-Medical): No  Physical Activity: Not on file  Stress: Not on file  Social Connections: Not on file   Past Surgical History:  Procedure Laterality Date   NO PAST SURGERIES     Past Medical History:    Diagnosis Date   Hypertension    Stroke (HCC)    There were no vitals taken for this visit.  Opioid Risk Score:   Fall Risk Score:  `1  Depression screen PHQ 2/9     07/07/2022    1:42 PM 05/06/2022    1:25 PM 04/11/2022    1:11 PM 03/06/2022   10:25 AM 06/17/2013    2:06 PM  Depression screen PHQ 2/9  Decreased Interest 1 1 1 1 0  Down, Depressed, Hopeless 1 1 1 1 0  PHQ - 2 Score 2 2 2 2 0  Altered sleeping   2 2   Tired, decreased energy   1 1   Change in appetite   1 1   Feeling bad or failure about yourself    0 0   Trouble concentrating   1 1   Moving slowly or fidgety/restless   1 1   Suicidal thoughts   1 0   PHQ-9 Score   9 8   Difficult doing work/chores   Not difficult at all      Review of Systems  Musculoskeletal:  Positive for gait problem.  Neurological:  Positive for weakness. Negative for numbness.       Spasms   Psychiatric/Behavioral:  Positive for suicidal ideas.        Depression- Suicidal thoughts with no plan  All other systems reviewed and are negative.      Objective:   Physical Exam  Gen: no distress, normal appearing HEENT: oral mucosa pink and moist, NCAT Cardio: Reg rate Chest: normal effort, normal rate of breathing Abd: soft, non-distended Ext: no edema Psych: pleasant, normal affect Skin: intact Neuro: Alert and oriented x3 MSK: Hemiplegic left side, left shoulder subluxed      Assessment & Plan:  1) Chronic Pain Syndrome secondary to left shoulder subluxation -XR ordered -will plan for steroid injection next visit into her left shoulder -Discussed current symptoms of pain and history of pain.  -Discussed benefits of exercise in reducing pain. -Discussed following foods that may reduce pain: 1) Ginger (especially studied for arthritis)- reduce leukotriene production to decrease inflammation 2) Blueberries- high in phytonutrients that decrease inflammation 3) Salmon- marine omega-3s reduce joint swelling and pain 4) Pumpkin seeds- reduce inflammation 5) dark chocolate- reduces inflammation 6) turmeric- reduces inflammation 7) tart cherries - reduce pain and stiffness 8) extra virgin olive oil - its compound olecanthal helps to block prostaglandins  9) chili peppers- can be eaten or applied topically via capsaicin 10) mint- helpful for headache, muscle aches, joint pain, and itching 11) garlic- reduces inflammation  Link to further information on diet for chronic pain: [email or web address]    2) CVA -discussed free yoga classes for stroke survivors  3) Depression with suicidal ideation -cymbalta prescribed -discussed referral to psychiatry  

## 2022-11-06 ENCOUNTER — Encounter: Payer: 59 | Attending: Physical Medicine and Rehabilitation | Admitting: Physical Medicine and Rehabilitation

## 2022-11-06 VITALS — BP 124/87 | HR 86 | Ht 63.0 in | Wt 249.0 lb

## 2022-11-06 DIAGNOSIS — M7552 Bursitis of left shoulder: Secondary | ICD-10-CM | POA: Insufficient documentation

## 2022-11-06 DIAGNOSIS — E669 Obesity, unspecified: Secondary | ICD-10-CM | POA: Insufficient documentation

## 2022-11-06 DIAGNOSIS — R110 Nausea: Secondary | ICD-10-CM | POA: Insufficient documentation

## 2022-11-06 DIAGNOSIS — M25512 Pain in left shoulder: Secondary | ICD-10-CM | POA: Diagnosis not present

## 2022-11-06 DIAGNOSIS — E1142 Type 2 diabetes mellitus with diabetic polyneuropathy: Secondary | ICD-10-CM | POA: Diagnosis not present

## 2022-11-06 DIAGNOSIS — M7072 Other bursitis of hip, left hip: Secondary | ICD-10-CM | POA: Diagnosis not present

## 2022-11-06 DIAGNOSIS — E119 Type 2 diabetes mellitus without complications: Secondary | ICD-10-CM

## 2022-11-06 DIAGNOSIS — G6289 Other specified polyneuropathies: Secondary | ICD-10-CM

## 2022-11-06 DIAGNOSIS — Z6841 Body Mass Index (BMI) 40.0 and over, adult: Secondary | ICD-10-CM

## 2022-11-06 DIAGNOSIS — M25521 Pain in right elbow: Secondary | ICD-10-CM | POA: Insufficient documentation

## 2022-11-06 DIAGNOSIS — Z760 Encounter for issue of repeat prescription: Secondary | ICD-10-CM | POA: Diagnosis not present

## 2022-11-06 DIAGNOSIS — G8929 Other chronic pain: Secondary | ICD-10-CM

## 2022-11-06 MED ORDER — LIDOCAINE HCL 1 % IJ SOLN
4.0000 mL | Freq: Once | INTRAMUSCULAR | Status: AC
Start: 2022-11-06 — End: 2022-11-06
  Administered 2022-11-06: 4 mL

## 2022-11-06 MED ORDER — BETAMETHASONE SOD PHOS & ACET 6 (3-3) MG/ML IJ SUSP
6.0000 mg | Freq: Once | INTRAMUSCULAR | Status: AC
Start: 2022-11-06 — End: 2022-11-06
  Administered 2022-11-06: 6 mg via INTRAMUSCULAR

## 2022-11-06 MED ORDER — GABAPENTIN 400 MG PO CAPS: 400.0000 mg | ORAL_CAPSULE | Freq: Three times a day (TID) | ORAL | 3 refills | Status: DC

## 2022-11-06 MED ORDER — TRAMADOL HCL 50 MG PO TABS: 50.0000 mg | ORAL_TABLET | Freq: Two times a day (BID) | ORAL | 5 refills | Status: DC | PRN

## 2022-11-06 NOTE — Addendum Note (Signed)
Addended by: Silas Sacramento T on: 11/06/2022 10:23 AM   Modules accepted: Orders

## 2022-11-06 NOTE — Progress Notes (Signed)
Subjective:    Patient ID: Miranda Ross, female    DOB: 1975-07-30, 47 y.o.   MRN: [id number]  HPI  Miranda Ross is a 47 year woman who presents for follow-up of for left shoulder and hip bursitis, and right elbow epicondylitis.   1) Left greater trochanteric bursiits She gets steroid injections every 4-6 months or longer, at least once per year.  -pain has stable -gabapentin and tramadol are helping  2) Lateral epicondylitis She has recently developed right sided elbow pain -she tried a counterforce brace and this did not help -she asks if topical voltaren gel would help  3) Left shoulder pain Much improved after ECSWT initially, but pain has returned and she would like a steroid injection today  She noticed left sided puffiness near her clavicle, also with worsening left shoulder pain  She had an Achilles rupture and this has aggravated things. She follows with Dr. Ophelia Charter.   She takes Tramadol 50mg  BID.   She works as a Education officer, community for [employer].   Pain on average is 5/10.   4) Obesity: -has lost 35 lbs and is feeling better  5) Type 2 diabetes: -CBGs have improved  6) Nausea: -2/2 to ozempic  Pain Inventory Average Pain 5 Pain Right Now 6 My pain is constant and aching  In the last 24 hours, has pain interfered with the following? General activity 6 Relation with others 0 Enjoyment of life 5 What TIME of day is your pain at its worst? evening Sleep (in general) Poor  Pain is worse with: walking and standing Pain improves with: rest, heat/ice and medication Relief from Meds: 7  use a cane how many minutes can you walk? 3-4 mins- ( In a cam-walkern boot) ability to climb steps?  yes do you drive?  yes Do you have any goals in this area?  yes    weakness numbness tingling trouble walking  New Patient  New Patient    Family History  Problem Relation Age of Onset   Diabetes Mother    Heart disease Mother    Depression Mother     Diverticulosis Mother    Non-Hodgkin's lymphoma Father    Scleroderma Sister    Social History   Socioeconomic History   Marital status: Married    Spouse name: Not on file   Number of children: Not on file   Years of education: Not on file   Highest education level: Not on file  Occupational History   Not on file  Tobacco Use   Smoking status: Never   Smokeless tobacco: Never  Vaping Use   Vaping Use: Never used  Substance and Sexual Activity   Alcohol use: No   Drug use: No   Sexual activity: Yes    Birth control/protection: I.U.D.    Comment: Mirena IUD placed 2016  Other Topics Concern   Not on file  Social History Narrative   Not on file   Social Determinants of Health   Financial Resource Strain: Not on file  Food Insecurity: Not on file  Transportation Needs: Not on file  Physical Activity: Not on file  Stress: Not on file  Social Connections: Not on file   Past Surgical History:  Procedure Laterality Date   ACHILLES TENDON SURGERY Right 08/01/2019   Procedure: RIGHT REPAIR ACHILLES TENDON AVULSION;  Surgeon: Eldred Manges, MD;  Location: [facility];  Service: Orthopedics;  Laterality: Right;   BACK SURGERY     cervical neck  fusion 2002   FOOT SURGERY Left    topaz procedure   MASS EXCISION Left 09/02/2012   Procedure: MINOR EXCISION OF CYST LEFT RING FINGER A-3 PULLEY;  Surgeon: Wyn Forster., MD;  Location: [facility];  Service: Orthopedics;  Laterality: Left;   neck fusion  2002   OOPHORECTOMY     right hand surgery  2003   WISDOM TOOTH EXTRACTION     WRIST SURGERY Right    TFCC repair 2003   Past Medical History:  Diagnosis Date   Arthritis    Asthma    uses albuterol inh prn   Diabetes mellitus without complication (HCC)    NIDDM   PONV (postoperative nausea and vomiting)    Raynaud's disease    Sleep apnea 2013   sleep study 2013 at [facility], didnt finish study, left early   BP 124/87   Pulse 86   Ht 5\' 3"  (1.6 m)   Wt 249  lb (112.9 kg)   SpO2 99%   BMI 44.11 kg/m   Opioid Risk Score:   Fall Risk Score:  `1  Depression screen [facility] 2/9     07/08/2022    2:52 PM 06/05/2022    9:52 AM 05/29/2022    9:51 AM 05/22/2022   10:46 AM 02/25/2022   12:55 PM 09/10/2021    9:55 AM 12/28/2020   10:08 AM  Depression screen PHQ 2/9  Decreased Interest 0 0 0 0 0 0 0  Down, Depressed, Hopeless 0 0 0 0 0 0 0  PHQ - 2 Score 0 0 0 0 0 0 0   Review of Systems  Constitutional: Negative.   HENT: Negative.    Eyes: Negative.   Respiratory: Negative.    Cardiovascular:  Positive for leg swelling.  Gastrointestinal: Negative.   Endocrine: Negative.   Genitourinary: Negative.   Musculoskeletal:  Positive for gait problem.       Left shoulder, hips & right foot  Skin: Negative.   Allergic/Immunologic: Negative.   Hematological: Negative.   Psychiatric/Behavioral: Negative.    All other systems reviewed and are negative.      Objective:   Physical Exam Gen: no distress, normal appearing HEENT: oral mucosa pink and moist, NCAT Cardio: Reg rate Chest: normal effort, normal rate of breathing Abd: soft, non-distended Ext: no edema Psych: pleasant, normal affect Skin: intact Neuro: Alert and oriented x3 MSK: TTP over left greater trochanteric bursa and right lateral elbow     Assessment & Plan:  Miranda Ross is a 47 year old woman who presents for f/u for left shoulder and hip bursitis.  1) Left shoulder bursitis: -UDS and Pain contract obtained today. Discussed positive response to ECSWT -steroid injection performed, see separate note -Provided her with my cell phone number so she can call when she needs a refill of this medication. She takes 50mg  BID.  -discussed XR results that show acromioclavicular degeneration Discussed extracorporeal shockwave therapy as a modality for treatment. Discussed that the device looks and feels like a massage gun and I would move it over the area of pain for about 10 minutes. The  device releases sound waves to the area of pain and helps to improve blood flow and circulation to improve the healing process. Discuss that this initially induces inflammation and can sometimes cause short-term increase in pain. Discussed that we typically do three weekly treatments, but sometimes up to 6 if needed, and after 6 weeks long term benefits can sometimes be  achieved. Discussed that this is an FDA approved device, but not covered by insurance and would cost $60 per session. Will scheduled patient for 6 consecutive appointments and can cancel latter three if benefits are achieved after first three sessions.    2) Intercostal neuralgia, right side: -Increase gabapentin 300mg  TID  3) Peripheral neuropathy secondary to Type 2 DM: -Continue freestyle libre which has helped her get better control (dropped A1c by 3 points.  -refilled gabapentin  4) Right sided lateral epicondylitis: -recommended applying voltaren gel 4 times per day Discussed extracorporeal shockwave therapy as a modality for treatment. Discussed that the device looks and feels like a massage gun and I would move it over the area of pain for about 10 minutes. The device releases sound waves to the area of pain and helps to improve blood flow and circulation to improve the healing process. Discuss that this initially induces inflammation and can sometimes cause short-term increase in pain. Discussed that we typically do three weekly treatments, but sometimes up to 6 if needed, and after 6 weeks long term benefits can sometimes be achieved. Discussed that this is an FDA approved device, but not covered by insurance and would cost $60 per session. Will scheduled patient for 6 consecutive appointments and can cancel latter three if benefits are achieved after first three sessions.    5) Nausea: -discussed that it lasts a couple of days after her ozempic injection  6) Type 2 diabetes: -discussed that CBGs have improved with  Ozempic  7) Obesity: -discussed 35 lb weight loss with Ozempic, continue

## 2023-03-04 ENCOUNTER — Other Ambulatory Visit: Payer: Self-pay | Admitting: Physical Medicine and Rehabilitation

## 2023-03-12 ENCOUNTER — Encounter: Payer: 59 | Attending: Physical Medicine and Rehabilitation | Admitting: Physical Medicine and Rehabilitation

## 2023-03-12 ENCOUNTER — Encounter: Payer: Self-pay | Admitting: Physical Medicine and Rehabilitation

## 2023-03-12 VITALS — BP 145/90 | HR 90 | Ht 63.0 in | Wt 251.0 lb

## 2023-03-12 DIAGNOSIS — M7072 Other bursitis of hip, left hip: Secondary | ICD-10-CM | POA: Insufficient documentation

## 2023-03-12 MED ORDER — LIDOCAINE HCL 1 % IJ SOLN
4.0000 mL | Freq: Once | INTRAMUSCULAR | Status: AC
  Administered 2023-03-12: 4 mL

## 2023-03-12 MED ORDER — ONDANSETRON 4 MG PO TBDP: 4.0000 mg | ORAL_TABLET | Freq: Three times a day (TID) | ORAL | 0 refills | Status: DC | PRN

## 2023-03-12 MED ORDER — OZEMPIC (2 MG/DOSE) 8 MG/3ML ~~LOC~~ SOPN: 2.0000 mg | PEN_INJECTOR | SUBCUTANEOUS | 3 refills | Status: DC

## 2023-03-12 MED ORDER — BETAMETHASONE SOD PHOS & ACET 6 (3-3) MG/ML IJ SUSP
6.0000 mg | Freq: Once | INTRAMUSCULAR | Status: AC
Start: 2023-03-12 — End: 2023-03-12
  Administered 2023-03-12: 6 mg via INTRA_ARTICULAR

## 2023-03-12 NOTE — Progress Notes (Signed)
Left trochanteric bursa injection Without ultrasound guidance  Indication Trochanteric bursitis. Exam has tenderness over the greater trochanter of the hip. Pain has not responded to conservative care such as exercise therapy and oral medications. Pain interferes with sleep or with mobility Informed consent was obtained after describing risks and benefits of the procedure with the patient these include bleeding bruising and infection. Patient has signed written consent form. Patient placed in a lateral decubitus position with the affected hip superior. Point of maximal pain was palpated marked and prepped with Betadine and entered with a needle to bone contact. Needle slightly withdrawn then 6mg  of betamethasone with 3 cc 1% lidocaine were injected. Patient tolerated procedure well. Post procedure instructions given.   Prescribing Home Zynex NexWave Stimulator Device and supplies as needed. IFC, NMES and TENS medically necessary Treatment Rx: Daily @ 30-40 minutes per treatment PRN. Zynex NexWave only, no substitutions. Treatment Goals: 1) To reduce and/or eliminate pain 2) To improve functional capacity and Activities of daily living 3) To reduce or prevent the need for oral medications 4) To improve circulation in the injured region 5) To decrease or prevent muscle spasm and muscle atrophy 6) To provide a self-management tool to the patient The patient has not sufficiently improved with conservative care. Numerous studies indexed by Medline and PubMed.gov have shown Neuromuscular, Interferential, and TENS stimulators to reduce pain, improve function, and reduce medication use in injured patients. Continued use of this evidence based, safe, drug free treatment is both reasonable and medically necessary at this time.

## 2023-06-04 ENCOUNTER — Other Ambulatory Visit: Payer: Self-pay | Admitting: Physical Medicine and Rehabilitation

## 2023-06-11 ENCOUNTER — Encounter: Payer: 59 | Attending: Physical Medicine and Rehabilitation | Admitting: Physical Medicine and Rehabilitation

## 2023-06-11 VITALS — BP 126/86 | HR 81 | Ht 63.0 in | Wt 250.0 lb

## 2023-06-11 DIAGNOSIS — Z1321 Encounter for screening for nutritional disorder: Secondary | ICD-10-CM | POA: Diagnosis present

## 2023-06-11 DIAGNOSIS — Z131 Encounter for screening for diabetes mellitus: Secondary | ICD-10-CM | POA: Insufficient documentation

## 2023-06-11 DIAGNOSIS — M7072 Other bursitis of hip, left hip: Secondary | ICD-10-CM | POA: Insufficient documentation

## 2023-06-11 DIAGNOSIS — M7062 Trochanteric bursitis, left hip: Secondary | ICD-10-CM | POA: Diagnosis present

## 2023-06-11 DIAGNOSIS — Z13220 Encounter for screening for lipoid disorders: Secondary | ICD-10-CM | POA: Diagnosis present

## 2023-06-11 DIAGNOSIS — Z832 Family history of diseases of the blood and blood-forming organs and certain disorders involving the immune mechanism: Secondary | ICD-10-CM | POA: Insufficient documentation

## 2023-06-11 MED ORDER — LIDOCAINE HCL 1 % IJ SOLN
4.0000 mL | Freq: Once | INTRAMUSCULAR | Status: AC
Start: 2023-06-11 — End: ?

## 2023-06-11 MED ORDER — BETAMETHASONE SOD PHOS & ACET 6 (3-3) MG/ML IJ SUSP
12.0000 mg | Freq: Once | INTRAMUSCULAR | Status: DC
Start: 2023-06-11 — End: 2023-10-01

## 2023-06-11 NOTE — Progress Notes (Addendum)
Subjective:    Patient ID: Miranda Ross, female    DOB: 07/08/1975, 48 y.o.   MRN: [id number]  HPI  Miranda Ross is a 48 year woman who presents for follow-up of for left shoulder and hip bursitis, and right elbow epicondylitis.   1) Left greater trochanteric bursiits She gets steroid injections every 4-6 months or longer, at least once per year.  -pain has stable -gabapentin and tramadol are helping  2) Lateral epicondylitis She has recently developed right sided elbow pain -she tried a counterforce brace and this did not help -she asks if topical voltaren gel would help  3) Left shoulder pain Much improved after ECSWT initially, but pain has returned and she would like a steroid injection today  She noticed left sided puffiness near her clavicle, also with worsening left shoulder pain  She had an Achilles rupture and this has aggravated things. She follows with Dr. Ophelia Charter.   She takes Tramadol 50mg  BID.   She works as a Education officer, community for [employer].   Pain on average is 5/10.   4) Obesity: -has lost 35 lbs and is feeling better  5) Type 2 diabetes: -CBGs have improved -she would like to recheck her HgbA1c  6) Nausea: -2/2 to ozempic  Pain Inventory Average Pain 5 Pain Right Now 6 My pain is constant and aching  In the last 24 hours, has pain interfered with the following? General activity 6 Relation with others 0 Enjoyment of life 5 What TIME of day is your pain at its worst? evening Sleep (in general) Poor  Pain is worse with: walking and standing Pain improves with: rest, heat/ice and medication Relief from Meds: 7  use a cane how many minutes can you walk? 3-4 mins- ( In a cam-walkern boot) ability to climb steps?  yes do you drive?  yes Do you have any goals in this area?  yes    weakness numbness tingling trouble walking  New Patient  New Patient    Family History  Problem Relation Age of Onset   Diabetes Mother    Heart  disease Mother    Depression Mother    Diverticulosis Mother    Non-Hodgkin's lymphoma Father    Scleroderma Sister    Social History   Socioeconomic History   Marital status: Married    Spouse name: Not on file   Number of children: Not on file   Years of education: Not on file   Highest education level: Not on file  Occupational History   Not on file  Tobacco Use   Smoking status: Never   Smokeless tobacco: Never  Vaping Use   Vaping status: Never Used  Substance and Sexual Activity   Alcohol use: No   Drug use: No   Sexual activity: Yes    Birth control/protection: I.U.D.    Comment: Mirena IUD placed 2016  Other Topics Concern   Not on file  Social History Narrative   Not on file   Social Drivers of Health   Financial Resource Strain: Not on file  Food Insecurity: Not on file  Transportation Needs: Not on file  Physical Activity: Not on file  Stress: Not on file  Social Connections: Not on file   Past Surgical History:  Procedure Laterality Date   ACHILLES TENDON SURGERY Right 08/01/2019   Procedure: RIGHT REPAIR ACHILLES TENDON AVULSION;  Surgeon: Eldred Manges, MD;  Location: [facility];  Service: Orthopedics;  Laterality: Right;   BACK  SURGERY     cervical neck fusion 2002   FOOT SURGERY Left    topaz procedure   MASS EXCISION Left 09/02/2012   Procedure: MINOR EXCISION OF CYST LEFT RING FINGER A-3 PULLEY;  Surgeon: Wyn Forster., MD;  Location: [facility];  Service: Orthopedics;  Laterality: Left;   neck fusion  2002   OOPHORECTOMY     right hand surgery  2003   WISDOM TOOTH EXTRACTION     WRIST SURGERY Right    TFCC repair 2003   Past Medical History:  Diagnosis Date   Arthritis    Asthma    uses albuterol inh prn   Diabetes mellitus without complication (HCC)    NIDDM   PONV (postoperative nausea and vomiting)    Raynaud's disease    Sleep apnea 2013   sleep study 2013 at [place], didnt finish study, left early   BP 126/86    Pulse 81   Ht 5\' 3"  (1.6 m)   Wt 250 lb (113.4 kg)   SpO2 99%   BMI 44.29 kg/m   Opioid Risk Score:   Fall Risk Score:  `1  Depression screen [facility] 2/9     03/12/2023    9:45 AM 07/08/2022    2:52 PM 06/05/2022    9:52 AM 05/29/2022    9:51 AM 05/22/2022   10:46 AM 02/25/2022   12:55 PM 09/10/2021    9:55 AM  Depression screen PHQ 2/9  Decreased Interest 0 0 0 0 0 0 0  Down, Depressed, Hopeless 0 0 0 0 0 0 0  PHQ - 2 Score 0 0 0 0 0 0 0   Review of Systems  Constitutional: Negative.   HENT: Negative.    Eyes: Negative.   Respiratory: Negative.    Cardiovascular:  Positive for leg swelling.  Gastrointestinal: Negative.   Endocrine: Negative.   Genitourinary: Negative.   Musculoskeletal:  Positive for gait problem.       Left shoulder, hips & right foot  Skin: Negative.   Allergic/Immunologic: Negative.   Hematological: Negative.   Psychiatric/Behavioral: Negative.    All other systems reviewed and are negative.      Objective:   Physical Exam Gen: no distress, normal appearing HEENT: oral mucosa pink and moist, NCAT Cardio: Reg rate Chest: normal effort, normal rate of breathing Abd: soft, non-distended Ext: no edema Psych: pleasant, normal affect Skin: intact Neuro: Alert and oriented x3 MSK: TTP over left greater trochanteric bursa and right lateral elbow     Assessment & Plan:  Miranda Ross is a 48 year old woman who presents for f/u for left shoulder and hip bursitis.  1) Left shoulder bursitis: -UDS and Pain contract obtained today. Discussed positive response to ECSWT -steroid injection performed, see separate note -Provided her with my cell phone number so she can call when she needs a refill of this medication. She takes 50mg  BID.  -discussed XR results that show acromioclavicular degeneration Discussed extracorporeal shockwave therapy as a modality for treatment. Discussed that the device looks and feels like a massage gun and I would move it over the  area of pain for about 10 minutes. The device releases sound waves to the area of pain and helps to improve blood flow and circulation to improve the healing process. Discuss that this initially induces inflammation and can sometimes cause short-term increase in pain. Discussed that we typically do three weekly treatments, but sometimes up to 6 if needed, and after  6 weeks long term benefits can sometimes be achieved. Discussed that this is an FDA approved device, but not covered by insurance and would cost $60 per session. Will scheduled patient for 6 consecutive appointments and can cancel latter three if benefits are achieved after first three sessions.    2) Intercostal neuralgia, right side: -Increase gabapentin 300mg  TID  3) Peripheral neuropathy secondary to Type 2 DM: -Continue freestyle libre which has helped her get better control (dropped A1c by 3 points.  -refilled gabapentin  4) Right sided lateral epicondylitis: -recommended applying voltaren gel 4 times per day Discussed extracorporeal shockwave therapy as a modality for treatment. Discussed that the device looks and feels like a massage gun and I would move it over the area of pain for about 10 minutes. The device releases sound waves to the area of pain and helps to improve blood flow and circulation to improve the healing process. Discuss that this initially induces inflammation and can sometimes cause short-term increase in pain. Discussed that we typically do three weekly treatments, but sometimes up to 6 if needed, and after 6 weeks long term benefits can sometimes be achieved. Discussed that this is an FDA approved device, but not covered by insurance and would cost $60 per session. Will scheduled patient for 6 consecutive appointments and can cancel latter three if benefits are achieved after first three sessions.    5) Nausea: -discussed that it lasts a couple of days after her ozempic injection  6) Type 2  diabetes: -discussed that CBGs have improved with Ozempic -HgbA1c ordered, discussed that this has not been checked for a whilte  7) Obesity: -discussed 35 lb weight loss with Ozempic, continue -BMI reviewed and is 44.29  8) Screening for lipid dysfuntion: -lipid panel ordered  9) Screening for vitamin D deficiency: -discussed history of vitamin D deficiency and that she has been on high dose vitamin D in the past  10) Family history of autoimmune disorder: -ANA ordered -discussed that vitamin D deficiency can contribute to this -discussed that ANA alternates between positive and negative and she is not sure why.   11) Left hip bursitis: -continue tramadol

## 2023-06-11 NOTE — Addendum Note (Signed)
Addended by: Horton Chin on: 06/11/2023 11:04 AM   Modules accepted: Level of Service

## 2023-06-11 NOTE — Progress Notes (Signed)
Subjective:    Patient ID: Miranda Ross, female    DOB: 12/11/1972, 48 y.o.   MRN: [id number]  HPI Miranda Ross is a 48 year old female who presents for follow-up of of left arm pain.  1) Left arm pain: -she is hemiplegic -average pain is 5/10 -her husband is concerned that the shoulder is dislocated -she takes tylenol and this helps a lot  2) Insomnia -2/2 to pain in left shoulder  3) Impaired mobility: -husband is helping her  3) Vision blurry: -has an appointment with the eye doctor this month  4) Depression with suicidal ideation -most severe when her pain is severe   Pain Inventory Average Pain 5 Pain Right Now 7 My pain is constant and burning  LOCATION OF PAIN  left arm, left hand, left shoulder  BOWEL Number of stools per week: 7 Oral laxative use  Type of laxative stool softener    BLADDER Normal    Mobility use a wheelchair needs help with transfers Do you have any goals in this area?  yes  Function I need assistance with the following:  feeding, dressing, bathing, toileting, meal prep, household duties, and shopping Do you have any goals in this area?  yes  Neuro/Psych weakness trouble walking spasms depression suicidal thoughts  Prior Studies Any changes since last visit?  no  Physicians involved in your care Any changes since last visit?  no   Family History  Problem Relation Age of Onset   Stroke Other        Neg Hx   CAD Other        Neg Hx   Diabetes Other        Neg Hx   Social History   Socioeconomic History   Marital status: Married    Spouse name: Not on file   Number of children: 4   Years of education: none   Highest education level: Not on file  Occupational History   Occupation: n/a  Tobacco Use   Smoking status: Every Day    Packs/day: 0.20    Years: 32.00    Total pack years: 6.40    Types: Cigarettes    Last attempt to quit: 11/09/2013    Years since quitting: 8.6   Smokeless tobacco: Never  Vaping Use    Vaping Use: Every day   Substances: Nicotine  Substance and Sexual Activity   Alcohol use: No    Alcohol/week: 0.0 standard drinks of alcohol   Drug use: No   Sexual activity: Not on file  Other Topics Concern   Not on file  Social History Narrative   Patient lives at home with her husband.      Social Determinants of Health   Financial Resource Strain: Medium Risk (04/29/2022)   Overall Financial Resource Strain (CARDIA)    Difficulty of Paying Living Expenses: Somewhat hard  Food Insecurity: Food Insecurity Present (04/29/2022)   Hunger Vital Sign    Worried About Running Out of Food in the Last Year: Sometimes true    Ran Out of Food in the Last Year: Sometimes true  Transportation Needs: No Transportation Needs (04/29/2022)   PRAPARE - Administrator, Civil Service (Medical): No    Lack of Transportation (Non-Medical): No  Physical Activity: Not on file  Stress: Not on file  Social Connections: Not on file   Past Surgical History:  Procedure Laterality Date   NO PAST SURGERIES     Past Medical History:  Diagnosis Date   Hypertension    Stroke [facility])    There were no vitals taken for this visit.  Opioid Risk Score:   Fall Risk Score:  `1  Depression screen PHQ 2/9     07/07/2022    1:42 PM 05/06/2022    1:25 PM 04/11/2022    1:11 PM 03/06/2022   10:25 AM 06/17/2013    2:06 PM  Depression screen PHQ 2/9  Decreased Interest 1 1 1 1  0  Down, Depressed, Hopeless 1 1 1 1  0  PHQ - 2 Score 2 2 2 2  0  Altered sleeping   2 2   Tired, decreased energy   1 1   Change in appetite   1 1   Feeling bad or failure about yourself    0 0   Trouble concentrating   1 1   Moving slowly or fidgety/restless   1 1   Suicidal thoughts   1 0   PHQ-9 Score   9 8   Difficult doing work/chores   Not difficult at all      Review of Systems  Musculoskeletal:  Positive for gait problem.  Neurological:  Positive for weakness. Negative for numbness.       Spasms   Psychiatric/Behavioral:  Positive for suicidal ideas.        Depression- Suicidal thoughts with no plan  All other systems reviewed and are negative.      Objective:   Physical Exam  Gen: no distress, normal appearing HEENT: oral mucosa pink and moist, NCAT Cardio: Reg rate Chest: normal effort, normal rate of breathing Abd: soft, non-distended Ext: no edema Psych: pleasant, normal affect Skin: intact Neuro: Alert and oriented x3 MSK: Hemiplegic left side, left shoulder subluxed      Assessment & Plan:  1) Chronic Pain Syndrome secondary to left shoulder subluxation -XR ordered -will plan for steroid injection next visit into her left shoulder -Discussed current symptoms of pain and history of pain.  -Discussed benefits of exercise in reducing pain. -Discussed following foods that may reduce pain: 1) Ginger (especially studied for arthritis)- reduce leukotriene production to decrease inflammation 2) Blueberries- high in phytonutrients that decrease inflammation 3) Salmon- marine omega-3s reduce joint swelling and pain 4) Pumpkin seeds- reduce inflammation 5) dark chocolate- reduces inflammation 6) turmeric- reduces inflammation 7) tart cherries - reduce pain and stiffness 8) extra virgin olive oil - its compound olecanthal helps to block prostaglandins  9) chili peppers- can be eaten or applied topically via capsaicin 10) mint- helpful for headache, muscle aches, joint pain, and itching 11) garlic- reduces inflammation  Link to further information on diet for chronic pain: [email or web address]    2) CVA -discussed free yoga classes for stroke survivors  3) Depression with suicidal ideation -cymbalta prescribed -discussed referral to psychiatry

## 2023-06-12 LAB — LIPID PANEL
Chol/HDL Ratio: 3.6 {ratio} (ref 0.0–4.4)
Cholesterol, Total: 223 mg/dL — ABNORMAL HIGH (ref 100–199)
HDL: 62 mg/dL (ref >39)
LDL Chol Calc (NIH): 143 mg/dL — ABNORMAL HIGH (ref 0–99)
Triglycerides: 104 mg/dL (ref 0–149)
VLDL Cholesterol Cal: 18 mg/dL (ref 5–40)

## 2023-06-12 LAB — HEMOGLOBIN A1C
Est. average glucose Bld gHb Est-mCnc: 166 mg/dL
Hgb A1c MFr Bld: 7.4 % — ABNORMAL HIGH (ref 4.8–5.6)

## 2023-06-12 LAB — VITAMIN D 25 HYDROXY (VIT D DEFICIENCY, FRACTURES): Vit D, 25-Hydroxy: 16.8 ng/mL — ABNORMAL LOW (ref 30.0–100.0)

## 2023-06-15 ENCOUNTER — Encounter: Payer: 59 | Attending: Physical Medicine and Rehabilitation | Admitting: Physical Medicine and Rehabilitation

## 2023-06-15 DIAGNOSIS — Z7985 Long-term (current) use of injectable non-insulin antidiabetic drugs: Secondary | ICD-10-CM

## 2023-06-15 DIAGNOSIS — E119 Type 2 diabetes mellitus without complications: Secondary | ICD-10-CM | POA: Insufficient documentation

## 2023-06-15 MED ORDER — DEXCOM G7 RECEIVER DEVI: 1.0000 | Freq: Every day | 1 refills | Status: DC

## 2023-06-15 MED ORDER — DEXCOM G7 SENSOR MISC: 1.0000 | Freq: Every day | 1 refills | Status: DC

## 2023-06-15 NOTE — Progress Notes (Signed)
Subjective:    Patient ID: Miranda Ross, female    DOB: 01-Jul-1975, 48 y.o.   MRN: [id number]  HPI  An audio/video tele-health visit is felt to be the most appropriate encounter for this patient at this time. This is a follow up tele-visit via phone. The patient is at home. MD is at office. Prior to scheduling this appointment, our staff discussed the limitations of evaluation and management by telemedicine and the availability of in-person appointments. The patient expressed understanding and agreed to proceed.   Miranda Ross is a 77 year woman who presents for follow-up of for left shoulder and hip bursitis, and right elbow epicondylitis.   1) Left greater trochanteric bursiits She gets steroid injections every 4-6 months or longer, at least once per year.  -pain has stable -gabapentin and tramadol are helping  2) Lateral epicondylitis She has recently developed right sided elbow pain -she tried a counterforce brace and this did not help -she asks if topical voltaren gel would help  3) Left shoulder pain Much improved after ECSWT initially, but pain has returned and she would like a steroid injection today  She noticed left sided puffiness near her clavicle, also with worsening left shoulder pain  She had an Achilles rupture and this has aggravated things. She follows with Dr. Ophelia Charter.   She takes Tramadol 50mg  BID.   She works as a Education officer, community for [employer].   Pain on average is 5/10.   4) Obesity: -has lost 35 lbs and is feeling better  5) Type 2 diabetes: -CBGs have improved -discussed her repeat HgbA1c  6) Nausea: -2/2 to ozempic  Pain Inventory Average Pain 5 Pain Right Now 6 My pain is constant and aching  In the last 24 hours, has pain interfered with the following? General activity 6 Relation with others 0 Enjoyment of life 5 What TIME of day is your pain at its worst? evening Sleep (in general) Poor  Pain is worse with: walking and  standing Pain improves with: rest, heat/ice and medication Relief from Meds: 7  use a cane how many minutes can you walk? 3-4 mins- ( In a cam-walkern boot) ability to climb steps?  yes do you drive?  yes Do you have any goals in this area?  yes    weakness numbness tingling trouble walking  New Patient  New Patient    Family History  Problem Relation Age of Onset   Diabetes Mother    Heart disease Mother    Depression Mother    Diverticulosis Mother    Non-Hodgkin's lymphoma Father    Scleroderma Sister    Social History   Socioeconomic History   Marital status: Married    Spouse name: Not on file   Number of children: Not on file   Years of education: Not on file   Highest education level: Not on file  Occupational History   Not on file  Tobacco Use   Smoking status: Never   Smokeless tobacco: Never  Vaping Use   Vaping status: Never Used  Substance and Sexual Activity   Alcohol use: No   Drug use: No   Sexual activity: Yes    Birth control/protection: I.U.D.    Comment: Mirena IUD placed 2016  Other Topics Concern   Not on file  Social History Narrative   Not on file   Social Drivers of Health   Financial Resource Strain: Not on file  Food Insecurity: Not on file  Transportation  Needs: Not on file  Physical Activity: Not on file  Stress: Not on file  Social Connections: Not on file   Past Surgical History:  Procedure Laterality Date   ACHILLES TENDON SURGERY Right 08/01/2019   Procedure: RIGHT REPAIR ACHILLES TENDON AVULSION;  Surgeon: Eldred Manges, MD;  Location: [facility] OR;  Service: Orthopedics;  Laterality: Right;   BACK SURGERY     cervical neck fusion 2002   FOOT SURGERY Left    topaz procedure   MASS EXCISION Left 09/02/2012   Procedure: MINOR EXCISION OF CYST LEFT RING FINGER A-3 PULLEY;  Surgeon: Wyn Forster., MD;  Location: [facility];  Service: Orthopedics;  Laterality: Left;   neck fusion  2002   OOPHORECTOMY      right hand surgery  2003   WISDOM TOOTH EXTRACTION     WRIST SURGERY Right    TFCC repair 2003   Past Medical History:  Diagnosis Date   Arthritis    Asthma    uses albuterol inh prn   Diabetes mellitus without complication (HCC)    NIDDM   PONV (postoperative nausea and vomiting)    Raynaud's disease    Sleep apnea 2013   sleep study 2013 at [facility], didnt finish study, left early   There were no vitals taken for this visit.  Opioid Risk Score:   Fall Risk Score:  `1  Depression screen [facility] 2/9     03/12/2023    9:45 AM 07/08/2022    2:52 PM 06/05/2022    9:52 AM 05/29/2022    9:51 AM 05/22/2022   10:46 AM 02/25/2022   12:55 PM 09/10/2021    9:55 AM  Depression screen PHQ 2/9  Decreased Interest 0 0 0 0 0 0 0  Down, Depressed, Hopeless 0 0 0 0 0 0 0  PHQ - 2 Score 0 0 0 0 0 0 0   Review of Systems  Constitutional: Negative.   HENT: Negative.    Eyes: Negative.   Respiratory: Negative.    Cardiovascular:  Positive for leg swelling.  Gastrointestinal: Negative.   Endocrine: Negative.   Genitourinary: Negative.   Musculoskeletal:  Positive for gait problem.       Left shoulder, hips & right foot  Skin: Negative.   Allergic/Immunologic: Negative.   Hematological: Negative.   Psychiatric/Behavioral: Negative.    All other systems reviewed and are negative.      Objective:   Physical Exam Gen: no distress, normal appearing HEENT: oral mucosa pink and moist, NCAT Cardio: Reg rate Chest: normal effort, normal rate of breathing Abd: soft, non-distended Ext: no edema Psych: pleasant, normal affect Skin: intact Neuro: Alert and oriented x3 MSK: TTP over left greater trochanteric bursa and right lateral elbow     Assessment & Plan:  Miranda Ross is a 48 year old woman who presents for f/u for left shoulder and hip bursitis.  1) Left shoulder bursitis: -UDS and Pain contract obtained today. Discussed positive response to ECSWT -steroid injection performed,  see separate note -Provided her with my cell phone number so she can call when she needs a refill of this medication. She takes 50mg  BID.  -discussed XR results that show acromioclavicular degeneration Discussed extracorporeal shockwave therapy as a modality for treatment. Discussed that the device looks and feels like a massage gun and I would move it over the area of pain for about 10 minutes. The device releases sound waves to the area of pain and helps  to improve blood flow and circulation to improve the healing process. Discuss that this initially induces inflammation and can sometimes cause short-term increase in pain. Discussed that we typically do three weekly treatments, but sometimes up to 6 if needed, and after 6 weeks long term benefits can sometimes be achieved. Discussed that this is an FDA approved device, but not covered by insurance and would cost $60 per session. Will scheduled patient for 6 consecutive appointments and can cancel latter three if benefits are achieved after first three sessions.    2) Intercostal neuralgia, right side: -Increase gabapentin 300mg  TID  3) Peripheral neuropathy secondary to Type 2 DM: -Continue freestyle libre which has helped her get better control (dropped A1c by 3 points.  -refilled gabapentin  4) Right sided lateral epicondylitis: -recommended applying voltaren gel 4 times per day Discussed extracorporeal shockwave therapy as a modality for treatment. Discussed that the device looks and feels like a massage gun and I would move it over the area of pain for about 10 minutes. The device releases sound waves to the area of pain and helps to improve blood flow and circulation to improve the healing process. Discuss that this initially induces inflammation and can sometimes cause short-term increase in pain. Discussed that we typically do three weekly treatments, but sometimes up to 6 if needed, and after 6 weeks long term benefits can sometimes be  achieved. Discussed that this is an FDA approved device, but not covered by insurance and would cost $60 per session. Will scheduled patient for 6 consecutive appointments and can cancel latter three if benefits are achieved after first three sessions.    5) Nausea: -discussed that it lasts a couple of days after her ozempic injection  6) Type 2 diabetes: -discussed that CBGs have improved with Ozempic -discussed that HgbA1 has improved to 7.4 from 9.2 -Dexcom 7 sensor and received ordered for her -recommended avoiding any drinks with added sugar -advised replacing soda with green tea  7) Obesity: -discussed 35 lb weight loss with Ozempic, continue -BMI reviewed and is 44.29  8) Screening for lipid dysfuntion: -lipid panel ordered  9) Screening for vitamin D deficiency: -discussed history of vitamin D deficiency and that she has been on high dose vitamin D in the past  10) Family history of autoimmune disorder: -ANA ordered -discussed that vitamin D deficiency can contribute to this -discussed that ANA alternates between positive and negative and she is not sure why.   11) Left hip bursitis: -continue tramadol  5 minutes spent in discussion that HgbA1c is 7.4, down from 9.2 several years ago, advised avoiding drinks with added sugar, dexcom 7 sensor and receiver ordered for her, advised replacing soda with green tea

## 2023-06-26 ENCOUNTER — Other Ambulatory Visit: Payer: Self-pay | Admitting: Physical Medicine and Rehabilitation

## 2023-06-26 MED ORDER — ONDANSETRON 4 MG PO TBDP: 4.0000 mg | ORAL_TABLET | Freq: Three times a day (TID) | ORAL | 0 refills | Status: DC | PRN

## 2023-09-11 ENCOUNTER — Other Ambulatory Visit: Payer: Self-pay | Admitting: Physical Medicine and Rehabilitation

## 2023-09-11 MED ORDER — ONDANSETRON 4 MG PO TBDP: 4.0000 mg | ORAL_TABLET | Freq: Three times a day (TID) | ORAL | 0 refills | Status: DC | PRN

## 2023-10-01 ENCOUNTER — Encounter: Payer: 59 | Attending: Physical Medicine and Rehabilitation | Admitting: Physical Medicine and Rehabilitation

## 2023-10-01 ENCOUNTER — Encounter: Payer: Self-pay | Admitting: Physical Medicine and Rehabilitation

## 2023-10-01 VITALS — BP 119/81 | HR 74 | Ht 63.0 in | Wt 254.0 lb

## 2023-10-01 DIAGNOSIS — E1165 Type 2 diabetes mellitus with hyperglycemia: Secondary | ICD-10-CM

## 2023-10-01 DIAGNOSIS — G894 Chronic pain syndrome: Secondary | ICD-10-CM | POA: Diagnosis not present

## 2023-10-01 DIAGNOSIS — Z794 Long term (current) use of insulin: Secondary | ICD-10-CM

## 2023-10-01 DIAGNOSIS — Z79899 Other long term (current) drug therapy: Secondary | ICD-10-CM | POA: Diagnosis present

## 2023-10-01 DIAGNOSIS — Z5181 Encounter for therapeutic drug level monitoring: Secondary | ICD-10-CM | POA: Diagnosis present

## 2023-10-01 DIAGNOSIS — M7062 Trochanteric bursitis, left hip: Secondary | ICD-10-CM | POA: Diagnosis present

## 2023-10-01 MED ORDER — BETAMETHASONE SOD PHOS & ACET 6 (3-3) MG/ML IJ SUSP
6.0000 mg | Freq: Once | INTRAMUSCULAR | Status: AC
  Administered 2023-10-01: 6 mg via INTRAMUSCULAR

## 2023-10-01 MED ORDER — LIDOCAINE HCL 1 % IJ SOLN
5.0000 mL | Freq: Once | INTRAMUSCULAR | Status: AC
  Administered 2023-10-01: 5 mL via INTRADERMAL

## 2023-10-01 MED ORDER — BETAMETHASONE SOD PHOS & ACET 6 (3-3) MG/ML IJ SUSP: 12.0000 mg | Freq: Once | INTRAMUSCULAR | Status: DC

## 2023-10-01 NOTE — Patient Instructions (Signed)
 Marykay Snipes MD

## 2023-10-01 NOTE — Progress Notes (Signed)
 Left hip injection  Indication: Left hip pain not relieved by medication management and other conservative care.  Informed consent was obtained after describing risks and benefits of the procedure with the patient, this includes bleeding, bruising, infection and medication side effects. The patient wishes to proceed and has given written consent. Patient was placed in a seated position. The left shoulder was marked and prepped with betadine in the subacromial area. A 25-gauge 1-1/2 inch needle was inserted into the subacromial area. After negative draw back for blood, a solution containing 1 mL of 6 mg per ML betamethasone  and 4 mL of 1% lidocaine  was injected. A band aid was applied. The patient tolerated the procedure well. Post procedure instructions were given.

## 2023-10-01 NOTE — Addendum Note (Signed)
 Addended by: Liam Redhead on: 10/01/2023 10:49 AM   Modules accepted: Orders

## 2023-10-02 ENCOUNTER — Other Ambulatory Visit: Payer: Self-pay | Admitting: Physical Medicine and Rehabilitation

## 2023-10-04 LAB — TOXASSURE SELECT,+ANTIDEPR,UR

## 2023-10-06 ENCOUNTER — Other Ambulatory Visit: Payer: Self-pay | Admitting: Physical Medicine and Rehabilitation

## 2023-10-06 MED ORDER — OZEMPIC (2 MG/DOSE) 8 MG/3ML ~~LOC~~ SOPN: 2.0000 mg | PEN_INJECTOR | SUBCUTANEOUS | 3 refills | Status: DC

## 2023-10-16 NOTE — Patient Instructions (Signed)

## 2023-10-26 ENCOUNTER — Other Ambulatory Visit: Payer: Self-pay | Admitting: Physical Medicine and Rehabilitation

## 2023-10-26 ENCOUNTER — Encounter: Payer: Self-pay | Admitting: Nurse Practitioner

## 2023-10-26 ENCOUNTER — Ambulatory Visit: Admitting: Nurse Practitioner

## 2023-10-26 VITALS — BP 108/74 | HR 59 | Ht 63.0 in | Wt 264.8 lb

## 2023-10-26 DIAGNOSIS — Z7985 Long-term (current) use of injectable non-insulin antidiabetic drugs: Secondary | ICD-10-CM

## 2023-10-26 DIAGNOSIS — E782 Mixed hyperlipidemia: Secondary | ICD-10-CM

## 2023-10-26 DIAGNOSIS — I10 Essential (primary) hypertension: Secondary | ICD-10-CM

## 2023-10-26 DIAGNOSIS — E119 Type 2 diabetes mellitus without complications: Secondary | ICD-10-CM

## 2023-10-26 DIAGNOSIS — E559 Vitamin D deficiency, unspecified: Secondary | ICD-10-CM

## 2023-10-26 LAB — POCT GLYCOSYLATED HEMOGLOBIN (HGB A1C): Hemoglobin A1C: 7.7 % — AB (ref 4.0–5.6)

## 2023-10-26 NOTE — Progress Notes (Signed)
 Endocrinology Consult Note       10/26/2023, 3:57 PM   Subjective:    Patient ID: Miranda Ross, female    DOB: [birth year].  Miranda Ross is being seen in consultation for management of currently uncontrolled symptomatic diabetes requested by  Liam Redhead, MD.   Past Medical History:  Diagnosis Date   Arthritis    Asthma    uses albuterol  inh prn   Diabetes mellitus without complication (HCC)    NIDDM   PONV (postoperative nausea and vomiting)    Raynaud's disease    Sleep apnea 2013   sleep study 2013 at [place], didnt finish study, left early    Past Surgical History:  Procedure Laterality Date   ACHILLES TENDON SURGERY Right 08/01/2019   Procedure: RIGHT REPAIR ACHILLES TENDON AVULSION;  Surgeon: Adah Acron, MD;  Location: [facility] OR;  Service: Orthopedics;  Laterality: Right;   BACK SURGERY     cervical neck fusion 2002   FOOT SURGERY Left    topaz procedure   MASS EXCISION Left 09/02/2012   Procedure: MINOR EXCISION OF CYST LEFT RING FINGER A-3 PULLEY;  Surgeon: Amelie Baize., MD;  Location: [facility];  Service: Orthopedics;  Laterality: Left;   neck fusion  2002   OOPHORECTOMY     right hand surgery  2003   WISDOM TOOTH EXTRACTION     WRIST SURGERY Right    TFCC repair 2003    Social History   Socioeconomic History   Marital status: Married    Spouse name: Not on file   Number of children: Not on file   Years of education: Not on file   Highest education level: Not on file  Occupational History   Not on file  Tobacco Use   Smoking status: Never   Smokeless tobacco: Never  Vaping Use   Vaping status: Never Used  Substance and Sexual Activity   Alcohol use: No   Drug use: No   Sexual activity: Yes    Birth control/protection: I.U.D.    Comment: Mirena IUD placed 2016  Other Topics Concern   Not on file  Social History Narrative   Not on file   Social  Drivers of Health   Financial Resource Strain: Not on file  Food Insecurity: Not on file  Transportation Needs: Not on file  Physical Activity: Not on file  Stress: Not on file  Social Connections: Not on file    Family History  Problem Relation Age of Onset   Diabetes Mother    Heart disease Mother    Depression Mother    Diverticulosis Mother    Non-Hodgkin's lymphoma Father    Scleroderma Sister     Outpatient Encounter Medications as of 10/26/2023  Medication Sig   albuterol  (VENTOLIN  HFA) 108 (90 Base) MCG/ACT inhaler Inhale 2 puffs into the lungs every 6 (six) hours as needed for shortness of breath. For shortness of breath   levonorgestrel (MIRENA) 20 MCG/24HR IUD 1 each by Intrauterine route once.   ondansetron  (ZOFRAN ) 4 MG tablet TAKE (1) TABLET BY MOUTH EVERY EIGHT HOURS AS NEEDED.   ondansetron  (ZOFRAN -ODT) 4 MG disintegrating tablet Take  1 tablet (4 mg total) by mouth every 8 (eight) hours as needed for nausea or vomiting.   Semaglutide , 2 MG/DOSE, (OZEMPIC , 2 MG/DOSE,) 8 MG/3ML SOPN Inject 2 mg as directed once a week.   traMADol  (ULTRAM ) 50 MG tablet Take 1 tablet (50 mg total) by mouth every 6 (six) hours as needed. (Patient taking differently: Take 50 mg by mouth 4 (four) times daily.)   [DISCONTINUED] Insulin  Glargine (BASAGLAR  KWIKPEN) 100 UNIT/ML SOPN Inject 80 Units into the skin at bedtime.    [DISCONTINUED] Continuous Glucose Receiver (DEXCOM G7 RECEIVER) DEVI 1 each by Does not apply route daily. (Patient not taking: Reported on 10/26/2023)   [DISCONTINUED] Continuous Glucose Sensor (DEXCOM G7 SENSOR) MISC 1 each by Does not apply route daily. (Patient not taking: Reported on 10/26/2023)   [DISCONTINUED] gabapentin  (NEURONTIN ) 400 MG capsule Take 1 capsule (400 mg total) by mouth 3 (three) times daily. (Patient not taking: Reported on 10/26/2023)   [DISCONTINUED] HUMALOG  KWIKPEN 100 UNIT/ML KiwkPen Inject 40 Units into the skin 3 (three) times daily.  (Patient not  taking: Reported on 10/26/2023)   [DISCONTINUED] neomycin -polymyxin-hydrocortisone (CORTISPORIN) OTIC solution Place 3 drops into the left ear 4 (four) times daily. (Patient not taking: Reported on 10/26/2023)   Facility-Administered Encounter Medications as of 10/26/2023  Medication   diclofenac  sodium (VOLTAREN ) 1 % transdermal gel 4 g   lidocaine  (XYLOCAINE ) 1 % (with pres) injection 4 mL    ALLERGIES: No Known Allergies  VACCINATION STATUS:  There is no immunization history on file for this patient.  Diabetes She presents for her initial diabetic visit. She has type 2 diabetes mellitus. Onset time: diagnosed at approx age of 32. Her disease course has been fluctuating. There are no hypoglycemic associated symptoms. There are no diabetic associated symptoms. There are no hypoglycemic complications. Diabetic complications include peripheral neuropathy. Risk factors for coronary artery disease include diabetes mellitus, dyslipidemia, family history, hypertension and obesity. Current diabetic treatments: Ozempic  only. She is compliant with treatment all of the time. Her weight is fluctuating minimally. She is following a generally unhealthy diet. When asked about meal planning, she reported none. She has not had a previous visit with a dietitian. She participates in exercise intermittently. (She presents today with her CGM (uses Lingo on her phone) showing fluctuating glycemic profile.  Her POCT A1c today is 7.7%, increasing from last A1c of 7.4%.  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  She has been seeing Dr. Alessandra Ancona for diabetes management but notes to be able to get her Ozempic  she would have to be seen by endocrinology moving forward.  She drinks coffee (with creamer and skinny mix flavoring), water (with skinny mix SF flavoring) and eats 1-2 meals per day, snacking occasionally.  She says some days she grazes all day and does not eat a specific meal.  She engages in routine  physical activity by walking at work.  She is UTD on eye exam and sees podiatry routinely.  She was previously on intensive insulin  but was taken off after she transitioned to Ozempic .) An ACE inhibitor/angiotensin II receptor blocker is not being taken. She does not see a podiatrist.Eye exam is current.     Review of systems  Constitutional: + increasing body weight-admits her diet has not been that great recently, current Body mass index is 46.91 kg/m., no fatigue, no subjective hyperthermia, no subjective hypothermia Eyes: no blurry vision, no xerophthalmia ENT: no sore throat, no nodules palpated in throat, no dysphagia/odynophagia,  no hoarseness Cardiovascular: no chest pain, no shortness of breath, no palpitations, no leg swelling Respiratory: no cough, no shortness of breath Gastrointestinal: no nausea/vomiting/diarrhea Musculoskeletal: no muscle/joint aches Skin: no rashes, no hyperemia Neurological: no tremors, no numbness, no tingling, no dizziness Psychiatric: no depression, no anxiety  Objective:     BP 108/74 (BP Location: Left Arm, Patient Position: Sitting, Cuff Size: Large)   Pulse (!) 59   Ht 5' 3 (1.6 m)   Wt 264 lb 12.8 oz (120.1 kg)   BMI 46.91 kg/m   Wt Readings from Last 3 Encounters:  10/26/23 264 lb 12.8 oz (120.1 kg)  10/01/23 254 lb (115.2 kg)  06/11/23 250 lb (113.4 kg)     BP Readings from Last 3 Encounters:  10/26/23 108/74  10/01/23 119/81  06/11/23 126/86     Physical Exam- Limited  Constitutional:  Body mass index is 46.91 kg/m. , not in acute distress, normal state of mind Eyes:  EOMI, no exophthalmos Neck: Supple Cardiovascular: RRR, no murmurs, rubs, or gallops, no edema Respiratory: Adequate breathing efforts, no crackles, rales, rhonchi, or wheezing Musculoskeletal: no gross deformities, strength intact in all four extremities, no gross restriction of joint movements Skin:  no rashes, no hyperemia Neurological: no tremor with  outstretched hands   Diabetic Foot Exam - Simple   No data filed      CMP ( most recent) CMP     Component Value Date/Time   NA 137 08/02/2019 0533   NA 136 08/18/2016 1616   K 4.4 08/02/2019 0533   K 3.9 08/18/2016 1616   CL 104 08/02/2019 0533   CO2 23 08/02/2019 0533   CO2 26 08/18/2016 1616   GLUCOSE 201 (H) 08/02/2019 0533   GLUCOSE 140 08/18/2016 1616   BUN 13 08/02/2019 0533   BUN 11.3 08/18/2016 1616   CREATININE 0.64 08/02/2019 0533   CREATININE 0.8 08/18/2016 1616   CALCIUM 8.7 (L) 08/02/2019 0533   CALCIUM 9.3 08/18/2016 1616   PROT 7.4 08/01/2019 0642   PROT 8.3 08/18/2016 1616   PROT 7.4 08/18/2016 1616   ALBUMIN 2.6 (L) 08/01/2019 0642   ALBUMIN 2.9 (L) 08/18/2016 1616   AST 23 08/01/2019 0642   AST 22 08/18/2016 1616   ALT 16 08/01/2019 0642   ALT 21 08/18/2016 1616   ALKPHOS 97 08/01/2019 0642   ALKPHOS 124 08/18/2016 1616   BILITOT 0.4 08/01/2019 0642   BILITOT 0.46 08/18/2016 1616   EGFR >90 08/18/2016 1616   GFRNONAA >60 08/02/2019 0533     Diabetic Labs (most recent): Lab Results  Component Value Date   HGBA1C 7.7 (A) 10/26/2023   HGBA1C 7.4 (H) 06/11/2023   HGBA1C 9.2 (H) 08/01/2019     Lipid Panel ( most recent) Lipid Panel     Component Value Date/Time   CHOL 223 (H) 06/11/2023 1039   TRIG 104 06/11/2023 1039   HDL 62 06/11/2023 1039   CHOLHDL 3.6 06/11/2023 1039   LDLCALC 143 (H) 06/11/2023 1039   LABVLDL 18 06/11/2023 1039      No results found for: TSH, FREET4         Assessment & Plan:   1) Type 2 diabetes mellitus without complication, without long-term current use of insulin  (HCC) (Primary)  She presents today with her CGM (uses Lingo on her phone) showing fluctuating glycemic profile.  Her POCT A1c today is 7.7%, increasing from last A1c of 7.4%.  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  She has been seeing Dr. Alessandra Ancona for diabetes management but notes to be able to get her Ozempic  she  would have to be seen by endocrinology moving forward.  She drinks coffee (with creamer and skinny mix flavoring), water (with skinny mix SF flavoring) and eats 1-2 meals per day, snacking occasionally.  She says some days she grazes all day and does not eat a specific meal.  She engages in routine physical activity by walking at work.  She is UTD on eye exam and sees podiatry routinely.  She was previously on intensive insulin  but was taken off after she transitioned to Ozempic .  - Miranda Ross has currently uncontrolled symptomatic type 2 DM since 48 years of age, with most recent A1c of 7.7 %.   -Recent labs reviewed.  - I had a long discussion with her about the progressive nature of diabetes and the pathology behind its complications. -her diabetes is complicated by neuropathy and she remains at a high risk for more acute and chronic complications which include CAD, CVA, CKD, retinopathy, and neuropathy. These are all discussed in detail with her.  The following Lifestyle Medicine recommendations according to American College of Lifestyle Medicine [facility]) were discussed and offered to patient and she agrees to start the journey:  A. Whole Foods, Plant-based plate comprising of fruits and vegetables, plant-based proteins, whole-grain carbohydrates was discussed in detail with the patient.   A list for source of those nutrients were also provided to the patient.  Patient will use only water or unsweetened tea for hydration. B.  The need to stay away from risky substances including alcohol, smoking; obtaining 7 to 9 hours of restorative sleep, at least 150 minutes of moderate intensity exercise weekly, the importance of healthy social connections,  and stress reduction techniques were discussed. C.  A full color page of  Calorie density of various food groups per pound showing examples of each food groups was provided to the patient.  - I have counseled her on diet and weight management by adopting  a carbohydrate restricted/protein rich diet. Patient is encouraged to switch to unprocessed or minimally processed complex starch and increased protein intake (animal or plant source), fruits, and vegetables. -  she is advised to stick to a routine mealtimes to eat 3 meals a day and avoid unnecessary snacks (to snack only to correct hypoglycemia).   - she acknowledges that there is a room for improvement in her food and drink choices. - Suggestion is made for her to avoid simple carbohydrates from her diet including Cakes, Sweet Desserts, Ice Cream, Soda (diet and regular), Sweet Tea, Candies, Chips, Cookies, Store Bought Juices, Alcohol in Excess of 1-2 drinks a day, Artificial Sweeteners, Coffee Creamer, and Sugar-free Products. This will help patient to have more stable blood glucose profile and potentially avoid unintended weight gain.  - I have approached her with the following individualized plan to manage her diabetes and patient agrees:    -she is encouraged to start/continue monitoring glucose 2 times daily, before breakfast and before bed, and to call the clinic if she has readings less than 70 or above 300 for 3 tests in a row.  - Adjustment parameters are given to her for hypo and hyperglycemia in writing.  - she is advised to continue Ozempic  2 mg SQ weekly, therapeutically suitable for patient.  We may switch her to Mounjaro to see if she will get better A1c reduction without the nausea side effect with the Ozempic .  She  wants to see if her insurance will cover it optimally first.  She admits she needs to do better with her diet.  - Specific targets for  A1c; LDL, HDL, and Triglycerides were discussed with the patient.  2) Blood Pressure /Hypertension:  her blood pressure is controlled to target without the use of antihypertensive medications.  She will be considered for ACE/ARB if BP elevated on 3 separate occasions.  3) Lipids/Hyperlipidemia:    Review of her recent lipid panel  from 06/11/23 showed uncontrolled LDL at 143 .  she is not currently on any lipid lowering medications.  4)  Weight/Diet:  her Body mass index is 46.91 kg/m.  -  clearly complicating her diabetes care.   she is a candidate for weight loss. I discussed with her the fact that loss of 5 - 10% of her  current body weight will have the most impact on her diabetes management.  Exercise, and detailed carbohydrates information provided  -  detailed on discharge instructions.  5) Chronic Care/Health Maintenance: -she is not on ACEI/ARB or Statin medications and is encouraged to initiate and continue to follow up with Ophthalmology, Dentist, Podiatrist at least yearly or according to recommendations, and advised to stay away from smoking. I have recommended yearly flu vaccine and pneumonia vaccine at least every 5 years; moderate intensity exercise for up to 150 minutes weekly; and sleep for at least 7 hours a day.  - she is advised to maintain close follow up with Raulkar, Keven Pel, MD for primary care needs, as well as her other providers for optimal and coordinated care.   - Time spent in this patient care: 60 min, which was spent in counseling her about her diabetes and the rest reviewing her blood glucose logs, discussing her hypoglycemia and hyperglycemia episodes, reviewing her current and previous labs/studies (including abstraction from other facilities) and medications doses and developing a long term treatment plan based on the latest standards of care/guidelines; and documenting her care.    Please refer to Patient Instructions for Blood Glucose Monitoring and Insulin /Medications Dosing Guide in media tab for additional information. Please also refer to Patient Self Inventory in the Media tab for reviewed elements of pertinent patient history.  Miranda Ross participated in the discussions, expressed understanding, and voiced agreement with the above plans.  All questions were answered to her  satisfaction. she is encouraged to contact clinic should she have any questions or concerns prior to her return visit.     Follow up plan: - Return in about 4 months (around 02/25/2024) for Diabetes F/U with A1c in office, No previsit labs, Bring meter and logs.    Hulon Magic, [facility] [address] [place], [place] [address] Phone: [phone] Fax: [phone]  10/26/2023, 3:57 PM

## 2023-10-27 ENCOUNTER — Telehealth: Payer: Self-pay | Admitting: *Deleted

## 2023-10-27 ENCOUNTER — Telehealth: Payer: Self-pay | Admitting: Physical Medicine and Rehabilitation

## 2023-10-27 NOTE — Telephone Encounter (Signed)
 Patient called in to let us  know that during visit yesterday with endocrinologist she was not advised to stop gabapentin  . Patient needs a refill on medication and will contact endocrinologist office to ask why medication should be discontinued .

## 2023-10-27 NOTE — Telephone Encounter (Signed)
 Patient left a voice message. She states that when we saw her on yesterday, we took off the Gabapentin  400 mg , takes 1 by mouth 3 times daily. She called the prescribing doctor and because we had done this, they will not fill for her. Patient states that she really needs it . She is asking of we would put it back in so that she can get in refilled.

## 2023-10-28 ENCOUNTER — Encounter: Payer: Self-pay | Admitting: Nurse Practitioner

## 2023-10-28 NOTE — Telephone Encounter (Signed)
 Noted, called the patient and left a message. Shared that we were sorry for any inconvenience with the Gabapentin .   That [name] took a look and it looked like the ordering physician had put it back in and that it was sent to [facility].

## 2023-10-28 NOTE — Telephone Encounter (Signed)
 Not sure what happened, I know I did not discuss the gabapentin  with her since her other provider is managing that.  It looks like it got put back in though by Dr. Alessandra Ancona yesterday though.

## 2023-10-29 NOTE — Telephone Encounter (Signed)
 I have spoken with Miranda Ross and situation is handled.

## 2023-11-17 ENCOUNTER — Encounter: Payer: Self-pay | Admitting: Nurse Practitioner

## 2023-11-17 MED ORDER — ONDANSETRON 4 MG PO TBDP
4.0000 mg | ORAL_TABLET | Freq: Three times a day (TID) | ORAL | 0 refills | Status: DC | PRN
Start: 2023-11-17 — End: 2024-03-01

## 2023-12-18 ENCOUNTER — Other Ambulatory Visit: Payer: Self-pay | Admitting: Physical Medicine and Rehabilitation

## 2023-12-18 ENCOUNTER — Ambulatory Visit: Admitting: Podiatry

## 2023-12-18 ENCOUNTER — Ambulatory Visit (INDEPENDENT_AMBULATORY_CARE_PROVIDER_SITE_OTHER)

## 2023-12-18 VITALS — Ht 63.0 in | Wt 264.8 lb

## 2023-12-18 DIAGNOSIS — M7752 Other enthesopathy of left foot: Secondary | ICD-10-CM

## 2023-12-18 DIAGNOSIS — M7672 Peroneal tendinitis, left leg: Secondary | ICD-10-CM

## 2023-12-18 MED ORDER — MELOXICAM 7.5 MG PO TABS: 7.5000 mg | ORAL_TABLET | Freq: Every day | ORAL | 0 refills | Status: DC | PRN

## 2023-12-18 MED ORDER — TRIAMCINOLONE ACETONIDE 10 MG/ML IJ SUSP
5.0000 mg | Freq: Once | INTRAMUSCULAR | Status: AC
  Administered 2023-12-18: 5 mg via INTRA_ARTICULAR

## 2023-12-18 NOTE — Patient Instructions (Signed)

## 2023-12-24 NOTE — Progress Notes (Signed)
 Subjective:   Patient ID: Miranda Ross, female   DOB: 48 y.o.   MRN: [id number]   HPI Chief Complaint  Patient presents with   Foot Pain    RM 11  Patient is here for pain in left ankle/peroneal tendon/heel. Patient states pain has been present for the last month. Patient describes the pain as throbbing/burning and a 6 on a scale of 66-50.    48 year old female presents the office with above concerns.  States that she has been getting discomfort to the lateral aspect of the left foot going on since her accident course the peroneal tendon as well.  She does not recall any injuries.  She has not had any recent treatment.  She is a history of a right Achilles rupture.   Review of Systems  All other systems reviewed and are negative.  Past Medical History:  Diagnosis Date   Arthritis    Asthma    uses albuterol  inh prn   Diabetes mellitus without complication (HCC)    NIDDM   PONV (postoperative nausea and vomiting)    Raynaud's disease    Sleep apnea 2013   sleep study 2013 at [place], didnt finish study, left early    Past Surgical History:  Procedure Laterality Date   ACHILLES TENDON SURGERY Right 08/01/2019   Procedure: RIGHT REPAIR ACHILLES TENDON AVULSION;  Surgeon: Barbarann Oneil BROCKS, MD;  Location: [facility] OR;  Service: Orthopedics;  Laterality: Right;   BACK SURGERY     cervical neck fusion 2002   FOOT SURGERY Left    topaz procedure   MASS EXCISION Left 09/02/2012   Procedure: MINOR EXCISION OF CYST LEFT RING FINGER A-3 PULLEY;  Surgeon: Lamar LULLA Leonor Mickey., MD;  Location: [facility];  Service: Orthopedics;  Laterality: Left;   neck fusion  2002   OOPHORECTOMY     right hand surgery  2003   WISDOM TOOTH EXTRACTION     WRIST SURGERY Right    TFCC repair 2003     Current Outpatient Medications:    albuterol  (VENTOLIN  HFA) 108 (90 Base) MCG/ACT inhaler, Inhale 2 puffs into the lungs every 6 (six) hours as needed for shortness of breath. For shortness of breath,  Disp: , Rfl:    gabapentin  (NEURONTIN ) 400 MG capsule, TAKE ONE CAPSULE BY MOUTH 3 TIMES A DAY, Disp: 270 capsule, Rfl: 3   levonorgestrel (MIRENA) 20 MCG/24HR IUD, 1 each by Intrauterine route once., Disp: , Rfl:    meloxicam  (MOBIC ) 7.5 MG tablet, Take 1 tablet (7.5 mg total) by mouth daily as needed for pain., Disp: 30 tablet, Rfl: 0   ondansetron  (ZOFRAN ) 4 MG tablet, TAKE (1) TABLET BY MOUTH EVERY EIGHT HOURS AS NEEDED., Disp: 20 tablet, Rfl: 0   ondansetron  (ZOFRAN -ODT) 4 MG disintegrating tablet, Take 1 tablet (4 mg total) by mouth every 8 (eight) hours as needed for nausea or vomiting., Disp: 20 tablet, Rfl: 0   Semaglutide , 2 MG/DOSE, (OZEMPIC , 2 MG/DOSE,) 8 MG/3ML SOPN, Inject 2 mg as directed once a week., Disp: 3 mL, Rfl: 3   traMADol  (ULTRAM ) 50 MG tablet, Take 1 tablet (50 mg total) by mouth every 6 (six) hours as needed., Disp: 120 tablet, Rfl: 5  Current Facility-Administered Medications:    lidocaine  (XYLOCAINE ) 1 % (with pres) injection 4 mL, 4 mL, Other, Once,   No Known Allergies        Objective:  Physical Exam  General: AAO x3, NAD  Dermatological: Skin is warm, dry  and supple bilateral. There are no open sores, no preulcerative lesions, no rash or signs of infection present.  Vascular: Dorsalis Pedis artery and Posterior Tibial artery pedal pulses are 2/4 bilateral with immedate capillary fill time. There is no pain with calf compression, swelling, warmth, erythema.   Neruologic: Grossly intact via light touch bilateral.   Musculoskeletal: There is tenderness palpation on the course the peroneal tendon but clinically tendon appears to be intact.  There is tenderness along the sinus tarsi.  Slight edema present but there is no erythema or warmth.  There is no area of pinpoint tenderness otherwise.  MMT 5/5.  Gait: Unassisted, Nonantalgic.       Assessment:   Capsulitis left, peroneal tendinitis     Plan:  -Treatment options discussed including all  alternatives, risks, and complications -Etiology of symptoms were discussed -X-rays were obtained and reviewed with the patient.  3 views of the foot and 2 views of the ankle were obtained.  No evidence of acute fracture.  Large calcification, spurring present of the posterior calcaneus.  Accessory navicular. -Discussed daily injections of the sinus tarsi.  Verbal consent obtained and she wishes to proceed.  I cleaned the skin with Betadine, alcohol.  Mixture 1 cc Kenalog  10, 0.5 cc of Marcaine  plain, 0.5 cc lidocaine  plain was infiltrated into the sinus tarsi without complications.  Postinjection care discussed.  Tolerated well. -CAM boot dispensed for immobilization. -Prescribed mobic . Discussed side effects of the medication and directed to stop if any are to occur and call the office.   Return in about 4 weeks (around 01/15/2024).  Donnice JONELLE Fees DPM

## 2023-12-28 ENCOUNTER — Encounter: Payer: Self-pay | Admitting: Nurse Practitioner

## 2023-12-28 MED ORDER — TIRZEPATIDE 7.5 MG/0.5ML ~~LOC~~ SOAJ: 7.5000 mg | SUBCUTANEOUS | 1 refills | Status: DC

## 2024-01-15 ENCOUNTER — Ambulatory Visit: Admitting: Podiatry

## 2024-01-15 DIAGNOSIS — M7672 Peroneal tendinitis, left leg: Secondary | ICD-10-CM

## 2024-01-15 DIAGNOSIS — M7752 Other enthesopathy of left foot: Secondary | ICD-10-CM

## 2024-01-15 NOTE — Patient Instructions (Signed)
 For inserts I like POWERSTEPS, SUPERFEET

## 2024-01-17 NOTE — Progress Notes (Signed)
 Subjective:   Patient ID: Miranda Ross, female   DOB: 48 y.o.   MRN: [id number]   HPI Chief Complaint  Patient presents with   Foot Pain    Left ankle/peroneal tendon/heel. Wearing pneumatic cast. NIDDM A1C 7.7. 3 pain.     48 year old female presents the office with above concerns.  Overall states that she is feeling better but she still has some discomfort.  She does not report any recent injuries or changes otherwise.    Review of Systems  All other systems reviewed and are negative.  No Known Allergies      Objective:  Physical Exam  General: AAO x3, NAD  Dermatological: Skin is warm, dry and supple bilateral. There are no open sores, no preulcerative lesions, no rash or signs of infection present.  Vascular: Dorsalis Pedis artery and Posterior Tibial artery pedal pulses are 2/4 bilateral with immedate capillary fill time. There is no pain with calf compression, swelling, warmth, erythema.   Neruologic: Grossly intact via light touch bilateral.   Musculoskeletal: There is tenderness palpation on the course the peroneal tendon but clinically tendon appears to be intact.  Most of the tenderness is just behind the fibula.  There is tenderness along the sinus tarsi.  Slight edema present but there is no erythema or warmth.  There is no area of pinpoint tenderness otherwise.  MMT 5/5.  Gait: Unassisted, Nonantalgic.       Assessment:   Capsulitis left, peroneal tendinitis     Plan:  -Treatment options discussed including all alternatives, risks, and complications -Etiology of symptoms were discussed -We discussed inserts and she is in some inserts which seem to hurt her feet more.  Discussed that she is here to transition to a regular shoe supportive shoe gear or over-the-counter insert to help. -Referred to physical therapy. -NSAIDs prn -Given continuation of symptoms MRI ordered  Return in about 6 weeks (around 02/26/2024).  Donnice JONELLE Fees DPM

## 2024-01-21 LAB — HM MAMMOGRAPHY

## 2024-01-25 ENCOUNTER — Other Ambulatory Visit: Payer: Self-pay | Admitting: Podiatry

## 2024-01-25 ENCOUNTER — Other Ambulatory Visit: Payer: Self-pay | Admitting: Lab

## 2024-01-25 ENCOUNTER — Encounter: Payer: Self-pay | Admitting: Podiatry

## 2024-01-25 MED ORDER — MELOXICAM 7.5 MG PO TABS: 7.5000 mg | ORAL_TABLET | Freq: Every day | ORAL | 0 refills | Status: AC | PRN

## 2024-01-26 ENCOUNTER — Ambulatory Visit
Admission: RE | Admit: 2024-01-26 | Discharge: 2024-01-26 | Disposition: A | Discharge status: Home / Self Care | Source: Ambulatory Visit | Attending: Podiatry | Admitting: Podiatry

## 2024-01-26 ENCOUNTER — Ambulatory Visit: Payer: Self-pay | Admitting: Podiatry

## 2024-01-26 DIAGNOSIS — M7672 Peroneal tendinitis, left leg: Secondary | ICD-10-CM

## 2024-01-28 ENCOUNTER — Ambulatory Visit: Admitting: Nurse Practitioner

## 2024-01-29 ENCOUNTER — Ambulatory Visit (HOSPITAL_COMMUNITY)

## 2024-02-01 ENCOUNTER — Ambulatory Visit: Admitting: Physical Medicine and Rehabilitation

## 2024-02-04 ENCOUNTER — Encounter (HOSPITAL_COMMUNITY)

## 2024-02-04 ENCOUNTER — Encounter: Admitting: Physical Medicine and Rehabilitation

## 2024-02-04 ENCOUNTER — Encounter: Payer: Self-pay | Admitting: Physical Medicine and Rehabilitation

## 2024-02-05 ENCOUNTER — Encounter: Payer: Self-pay | Admitting: Nurse Practitioner

## 2024-02-05 MED ORDER — DEXCOM G7 SENSOR MISC: 1.0000 | 3 refills | Status: DC

## 2024-02-10 ENCOUNTER — Encounter (HOSPITAL_COMMUNITY)

## 2024-02-17 ENCOUNTER — Encounter (HOSPITAL_COMMUNITY)

## 2024-02-23 ENCOUNTER — Encounter (HOSPITAL_COMMUNITY)

## 2024-02-25 ENCOUNTER — Ambulatory Visit: Admitting: Podiatry

## 2024-03-01 ENCOUNTER — Ambulatory Visit: Admitting: Nurse Practitioner

## 2024-03-01 ENCOUNTER — Encounter: Payer: Self-pay | Admitting: Nurse Practitioner

## 2024-03-01 ENCOUNTER — Encounter (HOSPITAL_COMMUNITY): Payer: Self-pay

## 2024-03-01 ENCOUNTER — Other Ambulatory Visit: Payer: Self-pay

## 2024-03-01 ENCOUNTER — Ambulatory Visit (HOSPITAL_COMMUNITY): Attending: Podiatry

## 2024-03-01 VITALS — BP 108/72 | HR 60 | Ht 63.0 in | Wt 262.4 lb

## 2024-03-01 DIAGNOSIS — E119 Type 2 diabetes mellitus without complications: Secondary | ICD-10-CM | POA: Diagnosis not present

## 2024-03-01 DIAGNOSIS — Z832 Family history of diseases of the blood and blood-forming organs and certain disorders involving the immune mechanism: Secondary | ICD-10-CM

## 2024-03-01 DIAGNOSIS — E559 Vitamin D deficiency, unspecified: Secondary | ICD-10-CM

## 2024-03-01 DIAGNOSIS — M7672 Peroneal tendinitis, left leg: Secondary | ICD-10-CM | POA: Diagnosis not present

## 2024-03-01 DIAGNOSIS — M79662 Pain in left lower leg: Secondary | ICD-10-CM | POA: Insufficient documentation

## 2024-03-01 DIAGNOSIS — Z7985 Long-term (current) use of injectable non-insulin antidiabetic drugs: Secondary | ICD-10-CM

## 2024-03-01 DIAGNOSIS — E782 Mixed hyperlipidemia: Secondary | ICD-10-CM | POA: Diagnosis not present

## 2024-03-01 DIAGNOSIS — I10 Essential (primary) hypertension: Secondary | ICD-10-CM | POA: Diagnosis not present

## 2024-03-01 DIAGNOSIS — Z7409 Other reduced mobility: Secondary | ICD-10-CM | POA: Insufficient documentation

## 2024-03-01 DIAGNOSIS — M25672 Stiffness of left ankle, not elsewhere classified: Secondary | ICD-10-CM | POA: Diagnosis present

## 2024-03-01 LAB — POCT GLYCOSYLATED HEMOGLOBIN (HGB A1C): Hemoglobin A1C: 7.3 % — AB (ref 4.0–5.6)

## 2024-03-01 MED ORDER — TIRZEPATIDE 10 MG/0.5ML ~~LOC~~ SOAJ: 10.0000 mg | SUBCUTANEOUS | 1 refills | Status: AC

## 2024-03-01 MED ORDER — ONDANSETRON 4 MG PO TBDP: 4.0000 mg | ORAL_TABLET | Freq: Three times a day (TID) | ORAL | 0 refills | Status: DC | PRN

## 2024-03-01 NOTE — Therapy (Signed)
 OUTPATIENT PHYSICAL THERAPY LOWER EXTREMITY EVALUATION   Patient Name: Miranda Ross MRN: [id number] DOB:12-Sep-1975, 48 y.o., female Today's Date: 03/01/2024  END OF SESSION:  PT End of Session - 03/01/24 1013     Visit Number 1    Date for Recertification  04/12/24    Authorization Type [employer] OTHER    Authorization Time Period seeking auth    Authorization - Visit Number 1    Authorization - Number of Visits 1    Progress Note Due on Visit 10    PT Start Time 0730    PT Stop Time 0812    PT Time Calculation (min) 42 min    Activity Tolerance Patient tolerated treatment well;Patient limited by pain    Behavior During Therapy [facility] for tasks assessed/performed          Past Medical History:  Diagnosis Date   Arthritis    Asthma    uses albuterol  inh prn   Diabetes mellitus without complication (HCC)    NIDDM   PONV (postoperative nausea and vomiting)    Raynaud's disease    Sleep apnea 2013   sleep study 2013 at [place], didnt finish study, left early   Past Surgical History:  Procedure Laterality Date   ACHILLES TENDON SURGERY Right 08/01/2019   Procedure: RIGHT REPAIR ACHILLES TENDON AVULSION;  Surgeon: Barbarann Oneil BROCKS, MD;  Location: [facility] OR;  Service: Orthopedics;  Laterality: Right;   BACK SURGERY     cervical neck fusion 2002   FOOT SURGERY Left    topaz procedure   MASS EXCISION Left 09/02/2012   Procedure: MINOR EXCISION OF CYST LEFT RING FINGER A-3 PULLEY;  Surgeon: Lamar LULLA Leonor Mickey., MD;  Location: [facility];  Service: Orthopedics;  Laterality: Left;   neck fusion  2002   OOPHORECTOMY     right hand surgery  2003   WISDOM TOOTH EXTRACTION     WRIST SURGERY Right    TFCC repair 2003   Patient Active Problem List   Diagnosis Date Noted   Closed fracture of right proximal humerus 04/24/2020   Pain in right shoulder 04/04/2020   Shoulder subluxation, right 04/04/2020   S/P Achilles tendon repair 03/13/2020   Rupture of right  Achilles tendon 08/01/2019   Achilles rupture, right 07/21/2019   Class 3 obesity with body mass index (BMI) of 50.0 to 59.9 in adult 01/29/2017   Polyclonal gammopathy determined by serum protein electrophoresis 08/21/2016   MGUS (monoclonal gammopathy of unknown significance) 08/18/2016   History of asthma 07/28/2016   Raynaud's disease without gangrene 07/24/2016   Primary osteoarthritis of both knees 07/24/2016   DJD (degenerative joint disease), cervical 07/24/2016   History of gastroesophageal reflux (GERD) 07/24/2016   ANA positive 07/24/2016   Trochanteric bursitis of both hips 07/24/2016   Hyperlipidemia 06/18/2013   Hematuria 06/18/2013    PCP: Lorilee Sven SQUIBB, MD   REFERRING PROVIDER: Gershon Donnice SAUNDERS, DPM  REFERRING DIAG: [phone] (ICD-10-CM) - Peroneal tendinitis of left lower extremity  THERAPY DIAG:  Pain in left lower leg  Decreased range of motion of left ankle  Impaired functional mobility, balance, gait, and endurance  Rationale for Evaluation and Treatment: Rehabilitation  ONSET DATE: 3-4 months ago  SUBJECTIVE:   SUBJECTIVE STATEMENT: Pt states onset of left lower leg pain was insidious, just kept getting worse. Pt states it does not vary from morning to evening, just depends. Pt states that pain is intense at work after sitting for a  while and first couple of steps. Pt states she suffered a partial achilles tear on RLE while just walking which required surgical intervention.   PERTINENT HISTORY: Rupture achilles on RLE while walking Diabetes PAIN:  Are you having pain? Yes: NPRS scale: 4/10 Pain location: lateral calf and down to ankle on left side Pain description: burning, poking Aggravating factors: a lot of walking, prolonged standing Relieving factors: ice, sit down, pain meds otc  PRECAUTIONS: None  RED FLAGS: None   WEIGHT BEARING RESTRICTIONS: No  FALLS:  Has patient fallen in last 6 months? Yes. Number of falls 1, missed last  step at daughters, lowered herself and landed on R knee  LIVING ENVIRONMENT: Lives with: lives with their spouse Lives in: House/apartment Stairs: Yes: External: 2 steps; none Has following equipment at home: None  OCCUPATION: Pharmacy tech  PLOF: Independent and Independent with basic ADLs  PATIENT GOALS: decreased pain, walk longer, stand longer (job requires it)  NEXT MD VISIT: after therapy  OBJECTIVE:  Note: Objective measures were completed at Evaluation unless otherwise noted.  DIAGNOSTIC FINDINGS: MR ANKLE WITHOUT IV CONTRAST LEFT   COMPARISON: None.   CLINICAL HISTORY: Ankle pain, tendon abnormality suspected.   PULSE SEQUENCES: Ax T1, Ax T2 FS, Sag T1, Sag T2 FS, Cor STIR, Ax T1 FS   FINDINGS:   Bones: There is no fracture or contusion pattern. Mild degenerative changes are present without accelerated arthrosis. There is a prominent calcaneal spur. There is significant insertional tendinosis of the Achilles tendon with ossicles in the distal Achilles tendon.   Ligaments: The anterior and posterior tibiofibular and talofibular ligaments are intact. Deltoid ligament and spring ligaments are intact. The sinus tarsi is unremarkable.   Musculotendinous structures: The tibialis anterior, extensor digitorum and extensor hallucis longus tendons are unremarkable. The posterior tibial tendon, flexor hallucis longus and flexor digitorum tendons are unremarkable. Peroneal tendons demonstrate no significant abnormality. No significant tenosynovitis or tendinosis. There is chronic insertional tendinosis of the Achilles tendon with ossicles in the distal Achilles tendon. There is no full-thickness tear present. Trace retrocalcaneal bursal collection is identified. No significant thickening is seen in the plantar fascia.   IMPRESSION: Chronic thickening of the distal insertion of the Achilles tendon consistent with chronic tendinosis and ossicles are seen in the distal  Achilles tendon. No full-thickness tear is present.   Mild degenerative change without accelerated arthrosis.   No acute abnormality is appreciated.  PATIENT SURVEYS:  LEFS: 48 / 80 = 60.0 %   COGNITION: Overall cognitive status: Within functional limits for tasks assessed     SENSATION: Light touch: Impaired , normal neuropathy  EDEMA:  Swelling reported especially after standing on it a while    PALPATION: Abnormal sensitivity posterior to lateral malleolus on left side  LOWER EXTREMITY ROM:  Active ROM Right eval Left eval  Hip flexion    Hip extension    Hip abduction    Hip adduction    Hip internal rotation    Hip external rotation    Knee flexion    Knee extension    Ankle dorsiflexion 19 8, pain  Ankle plantarflexion 34 34, pain in achilles and lateral compartment  Ankle inversion 23 28, pain  Ankle eversion 35 20, worst pain   (Blank rows = not tested)  LOWER EXTREMITY MMT:  MMT Right eval Left eval  Hip flexion    Hip extension    Hip abduction    Hip adduction    Hip internal rotation  Hip external rotation    Knee flexion 3+ 4  Knee extension 3+ 4  Ankle dorsiflexion 4+ 4+  Ankle plantarflexion 4+ 4+  Ankle inversion 4+ 3  Ankle eversion 4+ 3   (Blank rows = not tested)  LOWER EXTREMITY SPECIAL TESTS:  Ankle special tests: Great toe extension test: negative  FUNCTIONAL TESTS:  5 times sit to stand: 13.59 seconds, increased discomfort in LLE lateral compartment 2 minute walk test: 390 feet, increased pain starts about a minute in  SLS 03/01/24: R: 13.77 seconds L: 3.9 seconds, increase pain in ankle  GAIT: Distance walked: 440 Assistive device utilized: None Level of assistance: Complete Independence Comments: increased lateral to medial movement during stance phase of LLE, possible compensation for R knee weakness although near symmetrical stride length noted                                                                                                                                 TREATMENT DATE:  03/01/2024   Evaluation: -ROM measured, Strength assessed, HEP prescribed, pt educated on prognosis, findings, and importance of HEP compliance if given.      PATIENT EDUCATION:  Education details: Pt was educated on findings of PT evaluation, prognosis, frequency of therapy visits and rationale, attendance policy, and HEP if given.   Person educated: Patient Education method: Explanation, Verbal cues, and Handouts Education comprehension: verbalized understanding, verbal cues required, and needs further education  HOME EXERCISE PROGRAM: Access Code: [email or web address] URL: [email or web address] Date: 03/01/2024 Prepared by: Lang Ada  Exercises - Supine Bridge  - 1 x daily - 7 x weekly - 3 sets - 10 reps - Seated Heel Raise  - 1 x daily - 7 x weekly - 3 sets - 10 reps - Soleus Stretch on Wall  - 1 x daily - 7 x weekly - 2 sets - 3 reps - 20 hold  ASSESSMENT:  CLINICAL IMPRESSION: Patient is a 48 y.o. female who was seen today for physical therapy evaluation and treatment for M76.72 (ICD-10-CM) - Peroneal tendinitis of left lower extremity.  Patient demonstrates decreased LLE strength, decreased L ankle ROM, abnormal pain in left ankle with palpation of peroneal tendons posterior to lateral malleolus, and impaired functional mobility and balance. Patient also demonstrates difficulty with ambulation during today's session with increased exaggeration of L stance phase and decreased velocity noted. Expected contributing factor is R knee weakness as pt states she fell on it and has been favoring that side. Patient also demonstrates pain with resisted eversion and inversion in lateral compartment and along achilles tendon. Patient requires education on role of PT, importance of HEP compliance and prognosis. Patient would benefit from skilled physical therapy for increased endurance with ambulation, increased  LE strength/ROM, and balance for improved gait quality, return to higher level of function with ADLs, and progress towards therapy goals.    OBJECTIVE IMPAIRMENTS: Abnormal gait, decreased activity tolerance, decreased balance, decreased endurance, decreased  mobility, difficulty walking, decreased ROM, decreased strength, hypomobility, impaired flexibility, impaired sensation, obesity, and pain.   ACTIVITY LIMITATIONS: carrying, lifting, bending, sitting, standing, squatting, stairs, transfers, and bed mobility  PARTICIPATION LIMITATIONS: meal prep, cleaning, laundry, shopping, community activity, and occupation  PERSONAL FACTORS: Age, Fitness, Past/current experiences, Time since onset of injury/illness/exacerbation, and 1 comorbidity: diabetes are also affecting patient's functional outcome.   REHAB POTENTIAL: Good  CLINICAL DECISION MAKING: Stable/uncomplicated  EVALUATION COMPLEXITY: Low   GOALS: Goals reviewed with patient? No  SHORT TERM GOALS: Target date: 03/22/24  Patient will demonstrate evidence of independence with individualized HEP and will report compliance for at least 3 days per week for optimized progression towards remaining therapy goals. Baseline:  Goal status: INITIAL  2.  Patient will report a decrease in pain level during community ambulation by at least 2 points for improved quality of life. Baseline: 4/10 Goal status: INITIAL     LONG TERM GOALS: Target date: 04/12/24  Pt will demonstrate a an increase of at least 9 points on the LEFS for improved performance of community ambulation and ADL. Baseline: see objective Goal status: INITIAL  2.  Pt will improve 2 MWT by 50 feet in order to demonstrate improved functional ambulatory capacity in community setting.  Baseline: see objective Goal status: INITIAL  3.  Pt will demonstrate pain free ROM in left ankle with a combined improvement of 12 degrees, for increased mobility and maximal efficiency of  gait cycle during ambulation. Baseline: see objective Goal status: INITIAL  4.  Pt will demonstrate at least 4+/5 MMT for bilateral lower extremity for increased strength during ADL and community ambulation. Baseline: see objective Goal status: INITIAL  5.  Pt will improve SLS by 10 seconds bilaterally in order to improve balance during functional activities and transfers. Baseline: see objective Goal status: INITIAL    PLAN:  PT FREQUENCY: 1-2x/week  PT DURATION: 6 weeks  PLANNED INTERVENTIONS: 97110-Therapeutic exercises, 97530- Therapeutic activity, 97112- Neuromuscular re-education, 97535- Self Care, 02859- Manual therapy, [phone]- Gait training, [phone]- Electrical stimulation (unattended), [phone]- Electrical stimulation (manual), 20560 (1-2 muscles), 20561 (3+ muscles)- Dry Needling, Patient/Family education, Balance training, Stair training, Joint mobilization, Joint manipulation, DME instructions, Cryotherapy, and Moist heat  PLAN FOR NEXT SESSION: dry needling, progress left ankle ROM and strengthening, review and progress HEP   Lang Ada, PT, DPT [facility] Office: [phone] 10:31 AM, 2024/03/17   Managed Medicaid Authorization Request Treatment Start Date: 03/17/24  Visit Dx Codes: F20.337; F74.327; Z74.09  Functional Tool Score: LEFS: 48 / 80 = 60.0 %   For all possible CPT codes, reference the Planned Interventions line above.     Check all conditions that are expected to impact treatment: {Conditions expected to impact treatment:Diabetes mellitus   If treatment provided at initial evaluation, no treatment charged due to lack of authorization.

## 2024-03-01 NOTE — Progress Notes (Signed)
 Endocrinology Follow Up Note       03/01/2024, 9:04 AM   Subjective:    Patient ID: Miranda Ross, female    DOB: 1975-09-29.  Miranda Ross is being seen in follow up after being seen in consultation for management of currently uncontrolled symptomatic diabetes requested by  Lorilee Sven SQUIBB, MD.   Past Medical History:  Diagnosis Date   Arthritis    Asthma    uses albuterol  inh prn   Diabetes mellitus without complication (HCC)    NIDDM   PONV (postoperative nausea and vomiting)    Raynaud's disease    Sleep apnea 2013   sleep study 2013 at [facility], didnt finish study, left early    Past Surgical History:  Procedure Laterality Date   ACHILLES TENDON SURGERY Right 08/01/2019   Procedure: RIGHT REPAIR ACHILLES TENDON AVULSION;  Surgeon: Barbarann Oneil BROCKS, MD;  Location: [facility] OR;  Service: Orthopedics;  Laterality: Right;   BACK SURGERY     cervical neck fusion 2002   FOOT SURGERY Left    topaz procedure   MASS EXCISION Left 09/02/2012   Procedure: MINOR EXCISION OF CYST LEFT RING FINGER A-3 PULLEY;  Surgeon: Lamar LULLA Leonor Mickey., MD;  Location: [facility];  Service: Orthopedics;  Laterality: Left;   neck fusion  2002   OOPHORECTOMY     right hand surgery  2003   WISDOM TOOTH EXTRACTION     WRIST SURGERY Right    TFCC repair 2003    Social History   Socioeconomic History   Marital status: Married    Spouse name: Not on file   Number of children: Not on file   Years of education: Not on file   Highest education level: Not on file  Occupational History   Not on file  Tobacco Use   Smoking status: Never   Smokeless tobacco: Never  Vaping Use   Vaping status: Never Used  Substance and Sexual Activity   Alcohol use: No   Drug use: No   Sexual activity: Yes    Birth control/protection: I.U.D.    Comment: Mirena IUD placed 2016  Other Topics Concern   Not on file  Social History  Narrative   Not on file   Social Drivers of Health   Financial Resource Strain: Not on file  Food Insecurity: Not on file  Transportation Needs: Not on file  Physical Activity: Not on file  Stress: Not on file  Social Connections: Not on file    Family History  Problem Relation Ross of Onset   Diabetes Mother    Heart disease Mother    Depression Mother    Diverticulosis Mother    Non-Hodgkin's lymphoma Father    Scleroderma Sister     Outpatient Encounter Medications as of 03/01/2024  Medication Sig   albuterol  (VENTOLIN  HFA) 108 (90 Base) MCG/ACT inhaler Inhale 2 puffs into the lungs every 6 (six) hours as needed for shortness of breath. For shortness of breath   gabapentin  (NEURONTIN ) 400 MG capsule TAKE ONE CAPSULE BY MOUTH 3 TIMES A DAY   levonorgestrel (MIRENA) 20 MCG/24HR IUD 1 each by Intrauterine route once.   meloxicam  (  MOBIC ) 7.5 MG tablet Take 1 tablet (7.5 mg total) by mouth daily as needed for pain.   ondansetron  (ZOFRAN ) 4 MG tablet TAKE (1) TABLET BY MOUTH EVERY EIGHT HOURS AS NEEDED.   tirzepatide  (MOUNJARO ) 10 MG/0.5ML Pen Inject 10 mg into the skin once a week.   traMADol  (ULTRAM ) 50 MG tablet Take 1 tablet (50 mg total) by mouth every 6 (six) hours as needed.   [DISCONTINUED] Continuous Glucose Sensor (DEXCOM G7 SENSOR) MISC Inject 1 Application into the skin as directed. Change sensor every 10 days as directed.   [DISCONTINUED] ondansetron  (ZOFRAN -ODT) 4 MG disintegrating tablet Take 1 tablet (4 mg total) by mouth every 8 (eight) hours as needed for nausea or vomiting.   [DISCONTINUED] tirzepatide  (MOUNJARO ) 7.5 MG/0.5ML Pen Inject 7.5 mg into the skin once a week.   ondansetron  (ZOFRAN -ODT) 4 MG disintegrating tablet Take 1 tablet (4 mg total) by mouth every 8 (eight) hours as needed for nausea or vomiting.   Facility-Administered Encounter Medications as of 03/01/2024  Medication   lidocaine  (XYLOCAINE ) 1 % (Miranda pres) injection 4 mL    ALLERGIES: No  Known Allergies  VACCINATION STATUS:  There is no immunization history on file for this patient.  Diabetes She presents for her follow-up diabetic visit. She has type 2 diabetes mellitus. Onset time: diagnosed at approx Ross of 39. Her disease course has been fluctuating. There are no hypoglycemic associated symptoms. There are no diabetic associated symptoms. There are no hypoglycemic complications. Diabetic complications include peripheral neuropathy. Risk factors for coronary artery disease include diabetes mellitus, dyslipidemia, family history, hypertension and obesity. Current diabetic treatments: Mounjaro  only. She is compliant Miranda treatment all of the time. Her weight is fluctuating minimally. She is following a generally unhealthy diet. When asked about meal planning, she reported none. She has not had a previous visit Miranda a dietitian. She participates in exercise intermittently. Her overall blood glucose range is 180-200 mg/dl. (She presents today Miranda her CGM showing slightly above target glycemic profile.  Her POCT A1c today is 7.3%, improving from last visit of 7.7%.  Analysis of her CGM shows TIR 54%, TAR 46%, TBR 0% Miranda a GMI of 7.7%.  She doesn't feel the current dose of Mounjaro  is working quite as well as the Ozempic  did.) An ACE inhibitor/angiotensin II receptor blocker is not being taken. She does not see a podiatrist.Eye exam is current.     Review of systems  Constitutional: + stable body weight, current Body mass index is 46.48 kg/m., no fatigue, no subjective hyperthermia, no subjective hypothermia Eyes: no blurry vision, no xerophthalmia ENT: no sore throat, no nodules palpated in throat, no dysphagia/odynophagia, no hoarseness Cardiovascular: no chest pain, no shortness of breath, no palpitations, no leg swelling Respiratory: no cough, no shortness of breath Gastrointestinal: no nausea/vomiting/diarrhea Musculoskeletal: no muscle/joint aches Skin: no rashes, no  hyperemia Neurological: no tremors, no numbness, no tingling, no dizziness Psychiatric: no depression, no anxiety  Objective:     BP 108/72 (BP Location: Left Arm, Patient Position: Sitting, Cuff Size: Large)   Pulse 60   Ht 5' 3 (1.6 m)   Wt 262 lb 6.4 oz (119 kg)   BMI 46.48 kg/m   Wt Readings from Last 3 Encounters:  03/01/24 262 lb 6.4 oz (119 kg)  12/18/23 264 lb 12.8 oz (120.1 kg)  10/26/23 264 lb 12.8 oz (120.1 kg)     BP Readings from Last 3 Encounters:  03/01/24 108/72  10/26/23 108/74  10/01/23 119/81  Physical Exam- Limited  Constitutional:  Body mass index is 46.48 kg/m. , not in acute distress, normal state of mind Eyes:  EOMI, no exophthalmos Musculoskeletal: no gross deformities, strength intact in all four extremities, no gross restriction of joint movements Skin:  no rashes, no hyperemia Neurological: no tremor Miranda outstretched hands   Diabetic Foot Exam - Simple   No data filed      CMP ( most recent) CMP     Component Value Date/Time   NA 137 08/02/2019 0533   NA 136 08/18/2016 1616   K 4.4 08/02/2019 0533   K 3.9 08/18/2016 1616   CL 104 08/02/2019 0533   CO2 23 08/02/2019 0533   CO2 26 08/18/2016 1616   GLUCOSE 201 (H) 08/02/2019 0533   GLUCOSE 140 08/18/2016 1616   BUN 13 08/02/2019 0533   BUN 11.3 08/18/2016 1616   CREATININE 0.64 08/02/2019 0533   CREATININE 0.8 08/18/2016 1616   CALCIUM 8.7 (L) 08/02/2019 0533   CALCIUM 9.3 08/18/2016 1616   PROT 7.4 08/01/2019 0642   PROT 8.3 08/18/2016 1616   PROT 7.4 08/18/2016 1616   ALBUMIN 2.6 (L) 08/01/2019 0642   ALBUMIN 2.9 (L) 08/18/2016 1616   AST 23 08/01/2019 0642   AST 22 08/18/2016 1616   ALT 16 08/01/2019 0642   ALT 21 08/18/2016 1616   ALKPHOS 97 08/01/2019 0642   ALKPHOS 124 08/18/2016 1616   BILITOT 0.4 08/01/2019 0642   BILITOT 0.46 08/18/2016 1616   EGFR >90 08/18/2016 1616   GFRNONAA >60 08/02/2019 0533     Diabetic Labs (most recent): Lab Results   Component Value Date   HGBA1C 7.3 (A) 03/01/2024   HGBA1C 7.7 (A) 10/26/2023   HGBA1C 7.4 (H) 06/11/2023     Lipid Panel ( most recent) Lipid Panel     Component Value Date/Time   CHOL 223 (H) 06/11/2023 1039   TRIG 104 06/11/2023 1039   HDL 62 06/11/2023 1039   CHOLHDL 3.6 06/11/2023 1039   LDLCALC 143 (H) 06/11/2023 1039   LABVLDL 18 06/11/2023 1039      No results found for: TSH, FREET4         Assessment & Plan:   1) Type 2 diabetes mellitus without complication, without long-term current use of insulin  (HCC) (Primary)  She presents today Miranda her CGM showing slightly above target glycemic profile.  Her POCT A1c today is 7.3%, improving from last visit of 7.7%.  Analysis of her CGM shows TIR 54%, TAR 46%, TBR 0% Miranda a GMI of 7.7%.  She doesn't feel the current dose of Mounjaro  is working quite as well as the Ozempic  did.  Miranda Ross, Miranda most recent A1c of 7.7 %.   -Recent labs reviewed.  - I had a long discussion Miranda her about the progressive nature of diabetes and the pathology behind its complications. -her diabetes is complicated by neuropathy and she remains at a high risk for more acute and chronic complications which include CAD, CVA, CKD, retinopathy, and neuropathy. These are all discussed in detail Miranda her.  The following Lifestyle Medicine recommendations according to American College of Lifestyle Medicine [facility]) were discussed and offered to patient and she agrees to start the journey:  A. Whole Foods, Plant-based plate comprising of fruits and vegetables, plant-based proteins, whole-grain carbohydrates was discussed in detail Miranda the patient.   A list for source of those nutrients were  also provided to the patient.  Patient will use only water or unsweetened tea for hydration. B.  The need to stay away from risky substances including alcohol, smoking; obtaining 7 to 9 hours  of restorative sleep, at least 150 minutes of moderate intensity exercise weekly, the importance of healthy social connections,  and stress reduction techniques were discussed. C.  A full color page of  Calorie density of various food groups per pound showing examples of each food groups was provided to the patient.  - Nutritional counseling repeated/built upon at each appointment.  - The patient admits there is a room for improvement in their diet and drink choices. -  Suggestion is made for the patient to avoid simple carbohydrates from their diet including Cakes, Sweet Desserts / Pastries, Ice Cream, Soda (diet and regular), Sweet Tea, Candies, Chips, Cookies, Sweet Pastries, Store Bought Juices, Alcohol in Excess of 1-2 drinks a day, Artificial Sweeteners, Coffee Creamer, and Sugar-free Products. This will help patient to have stable blood glucose profile and potentially avoid unintended weight gain.   - I encouraged the patient to switch to unprocessed or minimally processed complex starch and increased protein intake (animal or plant source), fruits, and vegetables.   - Patient is advised to stick to a routine mealtimes to eat 3 meals a day and avoid unnecessary snacks (to snack only to correct hypoglycemia).  - I have approached her Miranda the following individualized plan to manage her diabetes and patient agrees:   -she is encouraged to start/continue monitoring glucose 2 times daily, before breakfast and before bed, and to call the clinic if she has readings less than 70 or above 300 for 3 tests in a row.  She is currently using Stelo to monitor her glucose.  - Adjustment parameters are given to her for hypo and hyperglycemia in writing.  - Will increase her Mounjaro  to 10 mg SQ weekly.  - Specific targets for  A1c; LDL, HDL, and Triglycerides were discussed Miranda the patient.  2) Blood Pressure /Hypertension:  her blood pressure is controlled to target without the use of  antihypertensive medications.  She will be considered for ACE/ARB if BP elevated on 3 separate occasions.  3) Lipids/Hyperlipidemia:    Review of her recent lipid panel from 06/11/23 showed uncontrolled LDL at 143 .  she is not currently on any lipid lowering medications.  Will recheck lipid panel prior to next visit.  4)  Weight/Diet:  her Body mass index is 46.48 kg/m.  -  clearly complicating her diabetes care.   she is a candidate for weight loss. I discussed Miranda her the fact that loss of 5 - 10% of her  current body weight will have the most impact on her diabetes management.  Exercise, and detailed carbohydrates information provided  -  detailed on discharge instructions.  5) Chronic Care/Health Maintenance: -she is not on ACEI/ARB or Statin medications and is encouraged to initiate and continue to follow up Miranda Ophthalmology, Dentist, Podiatrist at least yearly or according to recommendations, and advised to stay away from smoking. I have recommended yearly flu vaccine and pneumonia vaccine at least every 5 years; moderate intensity exercise for up to 150 minutes weekly; and sleep for at least 7 hours a day.  - she is advised to maintain close follow up Miranda Raulkar, Sven SQUIBB, MD for primary care needs, as well as her other providers for optimal and coordinated care.  She has strong family history of autoimmune disorders (lupus, RA)  and is questioning if she has an autoimmune disease as well.  She had her ANA titers checked in the past Miranda variable results (sometimes positive then next time negative).  I will check thyroid antibodies to assess for autoimmune thyroid dysfunction.   However, her PCP will likely need to check for other potential autoimmune disorders or send to Rheumatology.     I spent  42  minutes in the care of the patient today including review of labs from CMP, Lipids, Thyroid Function, Hematology (current and previous including abstractions from other facilities);  face-to-face time discussing  her blood glucose readings/logs, discussing hypoglycemia and hyperglycemia episodes and symptoms, medications doses, her options of short and long term treatment based on the latest standards of care / guidelines;  discussion about incorporating lifestyle medicine;  and documenting the encounter. Risk reduction counseling performed per USPSTF guidelines to reduce obesity and cardiovascular risk factors.     Please refer to Patient Instructions for Blood Glucose Monitoring and Insulin /Medications Dosing Guide  in media tab for additional information. Please  also refer to  Patient Self Inventory in the Media  tab for reviewed elements of pertinent patient history.  Miranda Ross participated in the discussions, expressed understanding, and voiced agreement Miranda the above plans.  All questions were answered to her satisfaction. she is encouraged to contact clinic should she have any questions or concerns prior to her return visit.     Follow up plan: - Return in about 4 months (around 07/02/2024) for Diabetes F/U Miranda A1c in office, Previsit labs.   Benton Rio, [facility] [address] [place], [place] [address] Phone: [phone] Fax: [phone]  03/01/2024, 9:04 AM

## 2024-03-02 ENCOUNTER — Ambulatory Visit (HOSPITAL_COMMUNITY)

## 2024-03-02 DIAGNOSIS — Z7409 Other reduced mobility: Secondary | ICD-10-CM

## 2024-03-02 DIAGNOSIS — M25672 Stiffness of left ankle, not elsewhere classified: Secondary | ICD-10-CM

## 2024-03-02 DIAGNOSIS — M79662 Pain in left lower leg: Secondary | ICD-10-CM | POA: Diagnosis not present

## 2024-03-02 NOTE — Patient Instructions (Signed)

## 2024-03-02 NOTE — Therapy (Signed)
 OUTPATIENT PHYSICAL THERAPY LOWER EXTREMITY TREATMENT   Patient Name: Miranda Ross MRN: [id number] DOB:December 16, 1975, 48 y.o., female Today's Date: 03/02/2024  END OF SESSION:  PT End of Session - 03/02/24 0732     Visit Number 2    Number of Visits 12    Date for Recertification  04/12/24    Authorization Type [employer] OTHER    Authorization Time Period no auth required    Authorization - Number of Visits 1    Progress Note Due on Visit 10    PT Start Time 0732    PT Stop Time 0812    PT Time Calculation (min) 40 min    Activity Tolerance Patient tolerated treatment well;Patient limited by pain    Behavior During Therapy [facility] for tasks assessed/performed           Past Medical History:  Diagnosis Date   Arthritis    Asthma    uses albuterol  inh prn   Diabetes mellitus without complication (HCC)    NIDDM   PONV (postoperative nausea and vomiting)    Raynaud's disease    Sleep apnea 2013   sleep study 2013 at [facility], didnt finish study, left early   Past Surgical History:  Procedure Laterality Date   ACHILLES TENDON SURGERY Right 08/01/2019   Procedure: RIGHT REPAIR ACHILLES TENDON AVULSION;  Surgeon: Barbarann Oneil BROCKS, MD;  Location: [facility] OR;  Service: Orthopedics;  Laterality: Right;   BACK SURGERY     cervical neck fusion 2002   FOOT SURGERY Left    topaz procedure   MASS EXCISION Left 09/02/2012   Procedure: MINOR EXCISION OF CYST LEFT RING FINGER A-3 PULLEY;  Surgeon: Lamar LULLA Leonor Mickey., MD;  Location: [facility];  Service: Orthopedics;  Laterality: Left;   neck fusion  2002   OOPHORECTOMY     right hand surgery  2003   WISDOM TOOTH EXTRACTION     WRIST SURGERY Right    TFCC repair 2003   Patient Active Problem List   Diagnosis Date Noted   Closed fracture of right proximal humerus 04/24/2020   Pain in right shoulder 04/04/2020   Shoulder subluxation, right 04/04/2020   S/P Achilles tendon repair 03/13/2020   Rupture of right Achilles  tendon 08/01/2019   Achilles rupture, right 07/21/2019   Class 3 obesity with body mass index (BMI) of 50.0 to 59.9 in adult 01/29/2017   Polyclonal gammopathy determined by serum protein electrophoresis 08/21/2016   MGUS (monoclonal gammopathy of unknown significance) 08/18/2016   History of asthma 07/28/2016   Raynaud's disease without gangrene 07/24/2016   Primary osteoarthritis of both knees 07/24/2016   DJD (degenerative joint disease), cervical 07/24/2016   History of gastroesophageal reflux (GERD) 07/24/2016   ANA positive 07/24/2016   Trochanteric bursitis of both hips 07/24/2016   Hyperlipidemia 06/18/2013   Hematuria 06/18/2013    PCP: Lorilee Sven SQUIBB, MD   REFERRING PROVIDER: Gershon Donnice SAUNDERS, DPM  REFERRING DIAG: [phone] (ICD-10-CM) - Peroneal tendinitis of left lower extremity  THERAPY DIAG:  Pain in left lower leg  Decreased range of motion of left ankle  Impaired functional mobility, balance, gait, and endurance  Rationale for Evaluation and Treatment: Rehabilitation  ONSET DATE: 3-4 months ago  SUBJECTIVE:   SUBJECTIVE STATEMENT: Feeling stiff this morning.  4/10 pain on arrival left leg.     Eval:Pt states onset of left lower leg pain was insidious, just kept getting worse. Pt states it does not vary from morning  to evening, just depends. Pt states that pain is intense at work after sitting for a while and first couple of steps. Pt states she suffered a partial achilles tear on RLE while just walking which required surgical intervention.   PERTINENT HISTORY: Rupture achilles on RLE while walking Diabetes PAIN:  Are you having pain? Yes: NPRS scale: 4/10 Pain location: lateral calf and down to ankle on left side Pain description: burning, poking Aggravating factors: a lot of walking, prolonged standing Relieving factors: ice, sit down, pain meds otc  PRECAUTIONS: None  RED FLAGS: None   WEIGHT BEARING RESTRICTIONS: No  FALLS:  Has  patient fallen in last 6 months? Yes. Number of falls 1, missed last step at daughters, lowered herself and landed on R knee  LIVING ENVIRONMENT: Lives with: lives with their spouse Lives in: House/apartment Stairs: Yes: External: 2 steps; none Has following equipment at home: None  OCCUPATION: Pharmacy tech  PLOF: Independent and Independent with basic ADLs  PATIENT GOALS: decreased pain, walk longer, stand longer (job requires it)  NEXT MD VISIT: after therapy  OBJECTIVE:  Note: Objective measures were completed at Evaluation unless otherwise noted.  DIAGNOSTIC FINDINGS: MR ANKLE WITHOUT IV CONTRAST LEFT   COMPARISON: None.   CLINICAL HISTORY: Ankle pain, tendon abnormality suspected.   PULSE SEQUENCES: Ax T1, Ax T2 FS, Sag T1, Sag T2 FS, Cor STIR, Ax T1 FS   FINDINGS:   Bones: There is no fracture or contusion pattern. Mild degenerative changes are present without accelerated arthrosis. There is a prominent calcaneal spur. There is significant insertional tendinosis of the Achilles tendon with ossicles in the distal Achilles tendon.   Ligaments: The anterior and posterior tibiofibular and talofibular ligaments are intact. Deltoid ligament and spring ligaments are intact. The sinus tarsi is unremarkable.   Musculotendinous structures: The tibialis anterior, extensor digitorum and extensor hallucis longus tendons are unremarkable. The posterior tibial tendon, flexor hallucis longus and flexor digitorum tendons are unremarkable. Peroneal tendons demonstrate no significant abnormality. No significant tenosynovitis or tendinosis. There is chronic insertional tendinosis of the Achilles tendon with ossicles in the distal Achilles tendon. There is no full-thickness tear present. Trace retrocalcaneal bursal collection is identified. No significant thickening is seen in the plantar fascia.   IMPRESSION: Chronic thickening of the distal insertion of the Achilles tendon  consistent with chronic tendinosis and ossicles are seen in the distal Achilles tendon. No full-thickness tear is present.   Mild degenerative change without accelerated arthrosis.   No acute abnormality is appreciated.  PATIENT SURVEYS:  LEFS: 48 / 80 = 60.0 %   COGNITION: Overall cognitive status: Within functional limits for tasks assessed     SENSATION: Light touch: Impaired , normal neuropathy  EDEMA:  Swelling reported especially after standing on it a while    PALPATION: Abnormal sensitivity posterior to lateral malleolus on left side  LOWER EXTREMITY ROM:  Active ROM Right eval Left eval  Hip flexion    Hip extension    Hip abduction    Hip adduction    Hip internal rotation    Hip external rotation    Knee flexion    Knee extension    Ankle dorsiflexion 19 8, pain  Ankle plantarflexion 34 34, pain in achilles and lateral compartment  Ankle inversion 23 28, pain  Ankle eversion 35 20, worst pain   (Blank rows = not tested)  LOWER EXTREMITY MMT:  MMT Right eval Left eval  Hip flexion    Hip extension  Hip abduction    Hip adduction    Hip internal rotation    Hip external rotation    Knee flexion 3+ 4  Knee extension 3+ 4  Ankle dorsiflexion 4+ 4+  Ankle plantarflexion 4+ 4+  Ankle inversion 4+ 3  Ankle eversion 4+ 3   (Blank rows = not tested)  LOWER EXTREMITY SPECIAL TESTS:  Ankle special tests: Great toe extension test: negative  FUNCTIONAL TESTS:  5 times sit to stand: 13.59 seconds, increased discomfort in LLE lateral compartment 2 minute walk test: 390 feet, increased pain starts about a minute in  SLS 03/01/24: R: 13.77 seconds L: 3.9 seconds, increase pain in ankle  GAIT: Distance walked: 440 Assistive device utilized: None Level of assistance: Complete Independence Comments: increased lateral to medial movement during stance phase of LLE, possible compensation for R knee weakness although near symmetrical stride length  noted                                                                                                                                TREATMENT DATE:  03/02/2024 Review of HEP Review of dry needling instructions Manual to find trigger points left lower leg Trigger Point Dry Needling  Initial Treatment: Pt instructed on Dry Needling rational, procedures, and possible side effects. Pt instructed to expect mild to moderate muscle soreness later in the day and/or into the next day.  Pt instructed in methods to reduce muscle soreness. Pt instructed to continue prescribed HEP. Patient was educated on signs and symptoms of infection and other risk factors and advised to seek medical attention should they occur.  Patient verbalized understanding of these instructions and education.   Patient Verbal Consent Given: Yes Education Handout Provided: Yes Muscles Treated: left gastroc, soleus and peroneals Electrical Stimulation Performed: No Treatment Response/Outcome: multiple twitches; loosening of lower leg posterior musclulature  Manual stretching ankle inversion and dorsiflexion 3 x 20     03/01/2024   Evaluation: -ROM measured, Strength assessed, HEP prescribed, pt educated on prognosis, findings, and importance of HEP compliance if given.      PATIENT EDUCATION:  Education details: Pt was educated on findings of PT evaluation, prognosis, frequency of therapy visits and rationale, attendance policy, and HEP if given.   Person educated: Patient Education method: Explanation, Verbal cues, and Handouts Education comprehension: verbalized understanding, verbal cues required, and needs further education  HOME EXERCISE PROGRAM: Access Code: [email or web address] URL: [email or web address] Date: 03/01/2024 Prepared by: Lang Ada  Exercises - Supine Bridge  - 1 x daily - 7 x weekly - 3 sets - 10 reps - Seated Heel Raise  - 1 x daily - 7 x weekly - 3 sets - 10 reps - Soleus  Stretch on Wall  - 1 x daily - 7 x weekly - 2 sets - 3 reps - 20 hold  ASSESSMENT:  CLINICAL IMPRESSION: Started session with Review of HEP.  No issues with those exercises.  Review of dry needling instructions. Patient verbalizes understanding of instructions.   Put patient in prone and manual examination of left lower leg to find trigger points.  Dry needling to calf and left peroneals.  Multiple twitches noted. Patient with loosening of muscle after treatment; less pain per her report.  Patient will benefit from continued skilled therapy services  to address deficits and promote return to optimal function.      Eval:Patient is a 48 y.o. female who was seen today for physical therapy evaluation and treatment for M76.72 (ICD-10-CM) - Peroneal tendinitis of left lower extremity.  Patient demonstrates decreased LLE strength, decreased L ankle ROM, abnormal pain in left ankle with palpation of peroneal tendons posterior to lateral malleolus, and impaired functional mobility and balance. Patient also demonstrates difficulty with ambulation during today's session with increased exaggeration of L stance phase and decreased velocity noted. Expected contributing factor is R knee weakness as pt states she fell on it and has been favoring that side. Patient also demonstrates pain with resisted eversion and inversion in lateral compartment and along achilles tendon. Patient requires education on role of PT, importance of HEP compliance and prognosis. Patient would benefit from skilled physical therapy for increased endurance with ambulation, increased LE strength/ROM, and balance for improved gait quality, return to higher level of function with ADLs, and progress towards therapy goals.    OBJECTIVE IMPAIRMENTS: Abnormal gait, decreased activity tolerance, decreased balance, decreased endurance, decreased mobility, difficulty walking, decreased ROM, decreased strength, hypomobility, impaired flexibility, impaired  sensation, obesity, and pain.   ACTIVITY LIMITATIONS: carrying, lifting, bending, sitting, standing, squatting, stairs, transfers, and bed mobility  PARTICIPATION LIMITATIONS: meal prep, cleaning, laundry, shopping, community activity, and occupation  PERSONAL FACTORS: Age, Fitness, Past/current experiences, Time since onset of injury/illness/exacerbation, and 1 comorbidity: diabetes are also affecting patient's functional outcome.   REHAB POTENTIAL: Good  CLINICAL DECISION MAKING: Stable/uncomplicated  EVALUATION COMPLEXITY: Low   GOALS: Goals reviewed with patient? No  SHORT TERM GOALS: Target date: 03/22/24  Patient will demonstrate evidence of independence with individualized HEP and will report compliance for at least 3 days per week for optimized progression towards remaining therapy goals. Baseline:  Goal status: INITIAL  2.  Patient will report a decrease in pain level during community ambulation by at least 2 points for improved quality of life. Baseline: 4/10 Goal status: INITIAL     LONG TERM GOALS: Target date: 04/12/24  Pt will demonstrate a an increase of at least 9 points on the LEFS for improved performance of community ambulation and ADL. Baseline: see objective Goal status: INITIAL  2.  Pt will improve 2 MWT by 50 feet in order to demonstrate improved functional ambulatory capacity in community setting.  Baseline: see objective Goal status: INITIAL  3.  Pt will demonstrate pain free ROM in left ankle with a combined improvement of 12 degrees, for increased mobility and maximal efficiency of gait cycle during ambulation. Baseline: see objective Goal status: INITIAL  4.  Pt will demonstrate at least 4+/5 MMT for bilateral lower extremity for increased strength during ADL and community ambulation. Baseline: see objective Goal status: INITIAL  5.  Pt will improve SLS by 10 seconds bilaterally in order to improve balance during functional activities and  transfers. Baseline: see objective Goal status: INITIAL    PLAN:  PT FREQUENCY: 1-2x/week  PT DURATION: 6 weeks  PLANNED INTERVENTIONS: 97110-Therapeutic exercises, 97530- Therapeutic activity, [id number]- Neuromuscular re-education, 97535- Self Care, [id number]- Manual therapy, [id number]- Gait training, [phone]- Electrical  stimulation (unattended), [phone]- Electrical stimulation (manual), 79439 (1-2 muscles), [id number] (3+ muscles)- Dry Needling, Patient/Family education, Balance training, Stair training, Joint mobilization, Joint manipulation, DME instructions, Cryotherapy, and Moist heat  PLAN FOR NEXT SESSION: dry needling, progress left ankle ROM and strengthening, review and progress HEP   8:18 AM, 03/02/24 Julena Barbour Small Mckade Gurka MPT [facility]  [phone] Ph:[phone]

## 2024-03-09 ENCOUNTER — Ambulatory Visit (HOSPITAL_COMMUNITY)

## 2024-03-09 DIAGNOSIS — M25672 Stiffness of left ankle, not elsewhere classified: Secondary | ICD-10-CM

## 2024-03-09 DIAGNOSIS — M79662 Pain in left lower leg: Secondary | ICD-10-CM

## 2024-03-09 DIAGNOSIS — Z7409 Other reduced mobility: Secondary | ICD-10-CM

## 2024-03-09 NOTE — Therapy (Signed)
 OUTPATIENT PHYSICAL THERAPY LOWER EXTREMITY TREATMENT   Patient Name: Miranda Ross MRN: [id number] DOB:11-Aug-1975, 48 y.o., female Today's Date: 03/09/2024  END OF SESSION:  PT End of Session - 03/09/24 0739     Visit Number 3    Number of Visits 12    Date for Recertification  04/12/24    Authorization Type [employer] OTHER    Authorization Time Period no auth required    Authorization - Number of Visits 1    Progress Note Due on Visit 10    PT Start Time [phone]    PT Stop Time 0810    PT Time Calculation (min) 34 min    Activity Tolerance Patient tolerated treatment well;Patient limited by pain    Behavior During Therapy [facility] for tasks assessed/performed           Past Medical History:  Diagnosis Date   Arthritis    Asthma    uses albuterol  inh prn   Diabetes mellitus without complication (HCC)    NIDDM   PONV (postoperative nausea and vomiting)    Raynaud's disease    Sleep apnea 2013   sleep study 2013 at [facility], didnt finish study, left early   Past Surgical History:  Procedure Laterality Date   ACHILLES TENDON SURGERY Right 08/01/2019   Procedure: RIGHT REPAIR ACHILLES TENDON AVULSION;  Surgeon: Barbarann Oneil BROCKS, MD;  Location: [facility] OR;  Service: Orthopedics;  Laterality: Right;   BACK SURGERY     cervical neck fusion 2002   FOOT SURGERY Left    topaz procedure   MASS EXCISION Left 09/02/2012   Procedure: MINOR EXCISION OF CYST LEFT RING FINGER A-3 PULLEY;  Surgeon: Lamar LULLA Leonor Mickey., MD;  Location: [facility];  Service: Orthopedics;  Laterality: Left;   neck fusion  2002   OOPHORECTOMY     right hand surgery  2003   WISDOM TOOTH EXTRACTION     WRIST SURGERY Right    TFCC repair 2003   Patient Active Problem List   Diagnosis Date Noted   Closed fracture of right proximal humerus 04/24/2020   Pain in right shoulder 04/04/2020   Shoulder subluxation, right 04/04/2020   S/P Achilles tendon repair 03/13/2020   Rupture of right Achilles  tendon 08/01/2019   Achilles rupture, right 07/21/2019   Class 3 obesity with body mass index (BMI) of 50.0 to 59.9 in adult 01/29/2017   Polyclonal gammopathy determined by serum protein electrophoresis 08/21/2016   MGUS (monoclonal gammopathy of unknown significance) 08/18/2016   History of asthma 07/28/2016   Raynaud's disease without gangrene 07/24/2016   Primary osteoarthritis of both knees 07/24/2016   DJD (degenerative joint disease), cervical 07/24/2016   History of gastroesophageal reflux (GERD) 07/24/2016   ANA positive 07/24/2016   Trochanteric bursitis of both hips 07/24/2016   Hyperlipidemia 06/18/2013   Hematuria 06/18/2013    PCP: Lorilee Sven SQUIBB, MD   REFERRING PROVIDER: Gershon Donnice SAUNDERS, DPM  REFERRING DIAG: [phone] (ICD-10-CM) - Peroneal tendinitis of left lower extremity  THERAPY DIAG:  Pain in left lower leg  Decreased range of motion of left ankle  Impaired functional mobility, balance, gait, and endurance  Rationale for Evaluation and Treatment: Rehabilitation  ONSET DATE: 3-4 months ago  SUBJECTIVE:   SUBJECTIVE STATEMENT: Felt a lot looser and better after dry needling treatment; has tightened back up some but not as tight as prior to treatment; 3-4/10 pain today   Eval:Pt states onset of left lower leg pain was  insidious, just kept getting worse. Pt states it does not vary from morning to evening, just depends. Pt states that pain is intense at work after sitting for a while and first couple of steps. Pt states she suffered a partial achilles tear on RLE while just walking which required surgical intervention.   PERTINENT HISTORY: Rupture achilles on RLE while walking Diabetes PAIN:  Are you having pain? Yes: NPRS scale: 4/10 Pain location: lateral calf and down to ankle on left side Pain description: burning, poking Aggravating factors: a lot of walking, prolonged standing Relieving factors: ice, sit down, pain meds otc  PRECAUTIONS:  None  RED FLAGS: None   WEIGHT BEARING RESTRICTIONS: No  FALLS:  Has patient fallen in last 6 months? Yes. Number of falls 1, missed last step at daughters, lowered herself and landed on R knee  LIVING ENVIRONMENT: Lives with: lives with their spouse Lives in: House/apartment Stairs: Yes: External: 2 steps; none Has following equipment at home: None  OCCUPATION: Pharmacy tech  PLOF: Independent and Independent with basic ADLs  PATIENT GOALS: decreased pain, walk longer, stand longer (job requires it)  NEXT MD VISIT: after therapy  OBJECTIVE:  Note: Objective measures were completed at Evaluation unless otherwise noted.  DIAGNOSTIC FINDINGS: MR ANKLE WITHOUT IV CONTRAST LEFT   COMPARISON: None.   CLINICAL HISTORY: Ankle pain, tendon abnormality suspected.   PULSE SEQUENCES: Ax T1, Ax T2 FS, Sag T1, Sag T2 FS, Cor STIR, Ax T1 FS   FINDINGS:   Bones: There is no fracture or contusion pattern. Mild degenerative changes are present without accelerated arthrosis. There is a prominent calcaneal spur. There is significant insertional tendinosis of the Achilles tendon with ossicles in the distal Achilles tendon.   Ligaments: The anterior and posterior tibiofibular and talofibular ligaments are intact. Deltoid ligament and spring ligaments are intact. The sinus tarsi is unremarkable.   Musculotendinous structures: The tibialis anterior, extensor digitorum and extensor hallucis longus tendons are unremarkable. The posterior tibial tendon, flexor hallucis longus and flexor digitorum tendons are unremarkable. Peroneal tendons demonstrate no significant abnormality. No significant tenosynovitis or tendinosis. There is chronic insertional tendinosis of the Achilles tendon with ossicles in the distal Achilles tendon. There is no full-thickness tear present. Trace retrocalcaneal bursal collection is identified. No significant thickening is seen in the plantar fascia.    IMPRESSION: Chronic thickening of the distal insertion of the Achilles tendon consistent with chronic tendinosis and ossicles are seen in the distal Achilles tendon. No full-thickness tear is present.   Mild degenerative change without accelerated arthrosis.   No acute abnormality is appreciated.  PATIENT SURVEYS:  LEFS: 48 / 80 = 60.0 %   COGNITION: Overall cognitive status: Within functional limits for tasks assessed     SENSATION: Light touch: Impaired , normal neuropathy  EDEMA:  Swelling reported especially after standing on it a while    PALPATION: Abnormal sensitivity posterior to lateral malleolus on left side  LOWER EXTREMITY ROM:  Active ROM Right eval Left eval  Hip flexion    Hip extension    Hip abduction    Hip adduction    Hip internal rotation    Hip external rotation    Knee flexion    Knee extension    Ankle dorsiflexion 19 8, pain  Ankle plantarflexion 34 34, pain in achilles and lateral compartment  Ankle inversion 23 28, pain  Ankle eversion 35 20, worst pain   (Blank rows = not tested)  LOWER EXTREMITY MMT:  MMT  Right eval Left eval  Hip flexion    Hip extension    Hip abduction    Hip adduction    Hip internal rotation    Hip external rotation    Knee flexion 3+ 4  Knee extension 3+ 4  Ankle dorsiflexion 4+ 4+  Ankle plantarflexion 4+ 4+  Ankle inversion 4+ 3  Ankle eversion 4+ 3   (Blank rows = not tested)  LOWER EXTREMITY SPECIAL TESTS:  Ankle special tests: Great toe extension test: negative  FUNCTIONAL TESTS:  5 times sit to stand: 13.59 seconds, increased discomfort in LLE lateral compartment 2 minute walk test: 390 feet, increased pain starts about a minute in  SLS 03/01/24: R: 13.77 seconds L: 3.9 seconds, increase pain in ankle  GAIT: Distance walked: 440 Assistive device utilized: None Level of assistance: Complete Independence Comments: increased lateral to medial movement during stance phase of LLE,  possible compensation for R knee weakness although near symmetrical stride length noted                                                                                                                                TREATMENT DATE:  03/09/2024 Manual to find trigger points left lower leg Trigger Point Dry Needling  Subsequent Treatment: Instructions provided previously at initial dry needling treatment.   Patient Verbal Consent Given: Yes Education Handout Provided: Previously Provided Muscles Treated: left peroneals and calf/gastroc and soleus Electrical Stimulation Performed: No Treatment Response/Outcome: twitch x 2 in calf; noted decreased tightness after treatment Dorsiflexion and inversion stretching manually 5 x 20 hold    03/02/2024 Review of HEP Review of dry needling instructions Manual to find trigger points left lower leg Trigger Point Dry Needling  Initial Treatment: Pt instructed on Dry Needling rational, procedures, and possible side effects. Pt instructed to expect mild to moderate muscle soreness later in the day and/or into the next day.  Pt instructed in methods to reduce muscle soreness. Pt instructed to continue prescribed HEP. Patient was educated on signs and symptoms of infection and other risk factors and advised to seek medical attention should they occur.  Patient verbalized understanding of these instructions and education.   Patient Verbal Consent Given: Yes Education Handout Provided: Yes Muscles Treated: left gastroc, soleus and peroneals Electrical Stimulation Performed: No Treatment Response/Outcome: multiple twitches; loosening of lower leg posterior musclulature  Manual stretching ankle inversion and dorsiflexion 3 x 20     03/01/2024   Evaluation: -ROM measured, Strength assessed, HEP prescribed, pt educated on prognosis, findings, and importance of HEP compliance if given.      PATIENT EDUCATION:  Education details: Pt was  educated on findings of PT evaluation, prognosis, frequency of therapy visits and rationale, attendance policy, and HEP if given.   Person educated: Patient Education method: Explanation, Verbal cues, and Handouts Education comprehension: verbalized understanding, verbal cues required, and needs further education  HOME EXERCISE PROGRAM: Access Code: [email or web address] URL:  [email or web address] Date: 03/01/2024 Prepared by: Lang Ada  Exercises - Supine Bridge  - 1 x daily - 7 x weekly - 3 sets - 10 reps - Seated Heel Raise  - 1 x daily - 7 x weekly - 3 sets - 10 reps - Soleus Stretch on Wall  - 1 x daily - 7 x weekly - 2 sets - 3 reps - 20 hold  ASSESSMENT:  CLINICAL IMPRESSION: Today's session with focus on manual and dry needling to decrease calf and peroneal tightness left leg.  Noted tightness is mostly lateral lower leg; some tightness down near start of Achilles tendon as well.  Dry needling to those areas with subsequent noted decreased tightness of muscle tissue.  Ended with manual stretching of calf and peroneals.  Patient with have PT again tomorrow with more exercise focus but may be sore after recent needling.    Patient will benefit from continued skilled therapy services  to address deficits and promote return to optimal function.      Eval:Patient is a 48 y.o. female who was seen today for physical therapy evaluation and treatment for M76.72 (ICD-10-CM) - Peroneal tendinitis of left lower extremity.  Patient demonstrates decreased LLE strength, decreased L ankle ROM, abnormal pain in left ankle with palpation of peroneal tendons posterior to lateral malleolus, and impaired functional mobility and balance. Patient also demonstrates difficulty with ambulation during today's session with increased exaggeration of L stance phase and decreased velocity noted. Expected contributing factor is R knee weakness as pt states she fell on it and has been favoring that side. Patient  also demonstrates pain with resisted eversion and inversion in lateral compartment and along achilles tendon. Patient requires education on role of PT, importance of HEP compliance and prognosis. Patient would benefit from skilled physical therapy for increased endurance with ambulation, increased LE strength/ROM, and balance for improved gait quality, return to higher level of function with ADLs, and progress towards therapy goals.    OBJECTIVE IMPAIRMENTS: Abnormal gait, decreased activity tolerance, decreased balance, decreased endurance, decreased mobility, difficulty walking, decreased ROM, decreased strength, hypomobility, impaired flexibility, impaired sensation, obesity, and pain.   ACTIVITY LIMITATIONS: carrying, lifting, bending, sitting, standing, squatting, stairs, transfers, and bed mobility  PARTICIPATION LIMITATIONS: meal prep, cleaning, laundry, shopping, community activity, and occupation  PERSONAL FACTORS: Age, Fitness, Past/current experiences, Time since onset of injury/illness/exacerbation, and 1 comorbidity: diabetes are also affecting patient's functional outcome.   REHAB POTENTIAL: Good  CLINICAL DECISION MAKING: Stable/uncomplicated  EVALUATION COMPLEXITY: Low   GOALS: Goals reviewed with patient? No  SHORT TERM GOALS: Target date: 03/22/24  Patient will demonstrate evidence of independence with individualized HEP and will report compliance for at least 3 days per week for optimized progression towards remaining therapy goals. Baseline:  Goal status: INITIAL  2.  Patient will report a decrease in pain level during community ambulation by at least 2 points for improved quality of life. Baseline: 4/10 Goal status: INITIAL     LONG TERM GOALS: Target date: 04/12/24  Pt will demonstrate a an increase of at least 9 points on the LEFS for improved performance of community ambulation and ADL. Baseline: see objective Goal status: INITIAL  2.  Pt will improve 2  MWT by 50 feet in order to demonstrate improved functional ambulatory capacity in community setting.  Baseline: see objective Goal status: INITIAL  3.  Pt will demonstrate pain free ROM in left ankle with a combined improvement of 12 degrees, for increased mobility and maximal efficiency  of gait cycle during ambulation. Baseline: see objective Goal status: INITIAL  4.  Pt will demonstrate at least 4+/5 MMT for bilateral lower extremity for increased strength during ADL and community ambulation. Baseline: see objective Goal status: INITIAL  5.  Pt will improve SLS by 10 seconds bilaterally in order to improve balance during functional activities and transfers. Baseline: see objective Goal status: INITIAL    PLAN:  PT FREQUENCY: 1-2x/week  PT DURATION: 6 weeks  PLANNED INTERVENTIONS: 97110-Therapeutic exercises, 97530- Therapeutic activity, 97112- Neuromuscular re-education, 97535- Self Care, [id number]- Manual therapy, [phone]- Gait training, [phone]- Electrical stimulation (unattended), [phone]- Electrical stimulation (manual), [phone] (1-2 muscles), [phone] (3+ muscles)- Dry Needling, Patient/Family education, Balance training, Stair training, Joint mobilization, Joint manipulation, DME instructions, Cryotherapy, and Moist heat  PLAN FOR NEXT SESSION: dry needling, progress left ankle ROM and strengthening, review and progress HEP   8:15 AM, 03/09/24 Shahram Alexopoulos Small Arlin Sass MPT [facility] [place] [phone] [phone]

## 2024-03-11 ENCOUNTER — Ambulatory Visit (HOSPITAL_COMMUNITY)

## 2024-03-11 ENCOUNTER — Encounter (HOSPITAL_COMMUNITY): Payer: Self-pay

## 2024-03-11 DIAGNOSIS — M79662 Pain in left lower leg: Secondary | ICD-10-CM | POA: Diagnosis not present

## 2024-03-11 DIAGNOSIS — M25672 Stiffness of left ankle, not elsewhere classified: Secondary | ICD-10-CM

## 2024-03-11 DIAGNOSIS — Z7409 Other reduced mobility: Secondary | ICD-10-CM

## 2024-03-11 NOTE — Therapy (Signed)
 OUTPATIENT PHYSICAL THERAPY LOWER EXTREMITY TREATMENT   Patient Name: Miranda Ross MRN: [id number] DOB:09/08/1975, 48 y.o., female Today's Date: 03/11/2024  END OF SESSION:  PT End of Session - 03/11/24 0731     Visit Number 4    Number of Visits 12    Date for Recertification  04/12/24    Authorization Type [employer] OTHER    Authorization Time Period no auth required    PT Start Time 0731    PT Stop Time 0809    PT Time Calculation (min) 38 min    Activity Tolerance Patient tolerated treatment well;Patient limited by pain    Behavior During Therapy [facility] for tasks assessed/performed           Past Medical History:  Diagnosis Date   Arthritis    Asthma    uses albuterol  inh prn   Diabetes mellitus without complication (HCC)    NIDDM   PONV (postoperative nausea and vomiting)    Raynaud's disease    Sleep apnea 2013   sleep study 2013 at [place], didnt finish study, left early   Past Surgical History:  Procedure Laterality Date   ACHILLES TENDON SURGERY Right 08/01/2019   Procedure: RIGHT REPAIR ACHILLES TENDON AVULSION;  Surgeon: Barbarann Oneil BROCKS, MD;  Location: [facility] OR;  Service: Orthopedics;  Laterality: Right;   BACK SURGERY     cervical neck fusion 2002   FOOT SURGERY Left    topaz procedure   MASS EXCISION Left 09/02/2012   Procedure: MINOR EXCISION OF CYST LEFT RING FINGER A-3 PULLEY;  Surgeon: Lamar LULLA Leonor Mickey., MD;  Location: [facility];  Service: Orthopedics;  Laterality: Left;   neck fusion  2002   OOPHORECTOMY     right hand surgery  2003   WISDOM TOOTH EXTRACTION     WRIST SURGERY Right    TFCC repair 2003   Patient Active Problem List   Diagnosis Date Noted   Closed fracture of right proximal humerus 04/24/2020   Pain in right shoulder 04/04/2020   Shoulder subluxation, right 04/04/2020   S/P Achilles tendon repair 03/13/2020   Rupture of right Achilles tendon 08/01/2019   Achilles rupture, right 07/21/2019   Class 3  obesity with body mass index (BMI) of 50.0 to 59.9 in adult 01/29/2017   Polyclonal gammopathy determined by serum protein electrophoresis 08/21/2016   MGUS (monoclonal gammopathy of unknown significance) 08/18/2016   History of asthma 07/28/2016   Raynaud's disease without gangrene 07/24/2016   Primary osteoarthritis of both knees 07/24/2016   DJD (degenerative joint disease), cervical 07/24/2016   History of gastroesophageal reflux (GERD) 07/24/2016   ANA positive 07/24/2016   Trochanteric bursitis of both hips 07/24/2016   Hyperlipidemia 06/18/2013   Hematuria 06/18/2013    PCP: Lorilee Sven SQUIBB, MD   REFERRING PROVIDER: Gershon Donnice SAUNDERS, DPM  REFERRING DIAG: [phone] (ICD-10-CM) - Peroneal tendinitis of left lower extremity  THERAPY DIAG:  Pain in left lower leg  Decreased range of motion of left ankle  Impaired functional mobility, balance, gait, and endurance  Rationale for Evaluation and Treatment: Rehabilitation  ONSET DATE: 3-4 months ago  SUBJECTIVE:   SUBJECTIVE STATEMENT: Pt states right lateral knee 5/10 pain, left lateral lower leg pain 4/10. Pt states she still has a few spots that are still tender from the needling. Pt states she feels the needling has helped.   Eval:Pt states onset of left lower leg pain was insidious, just kept getting worse. Pt states it  does not vary from morning to evening, just depends. Pt states that pain is intense at work after sitting for a while and first couple of steps. Pt states she suffered a partial achilles tear on RLE while just walking which required surgical intervention.   PERTINENT HISTORY: Rupture achilles on RLE while walking Diabetes PAIN:  Are you having pain? Yes: NPRS scale: 4/10 Pain location: lateral calf and down to ankle on left side Pain description: burning, poking Aggravating factors: a lot of walking, prolonged standing Relieving factors: ice, sit down, pain meds otc  PRECAUTIONS: None  RED  FLAGS: None   WEIGHT BEARING RESTRICTIONS: No  FALLS:  Has patient fallen in last 6 months? Yes. Number of falls 1, missed last step at daughters, lowered herself and landed on R knee  LIVING ENVIRONMENT: Lives with: lives with their spouse Lives in: House/apartment Stairs: Yes: External: 2 steps; none Has following equipment at home: None  OCCUPATION: Pharmacy tech  PLOF: Independent and Independent with basic ADLs  PATIENT GOALS: decreased pain, walk longer, stand longer (job requires it)  NEXT MD VISIT: after therapy  OBJECTIVE:  Note: Objective measures were completed at Evaluation unless otherwise noted.  DIAGNOSTIC FINDINGS: MR ANKLE WITHOUT IV CONTRAST LEFT   COMPARISON: None.   CLINICAL HISTORY: Ankle pain, tendon abnormality suspected.   PULSE SEQUENCES: Ax T1, Ax T2 FS, Sag T1, Sag T2 FS, Cor STIR, Ax T1 FS   FINDINGS:   Bones: There is no fracture or contusion pattern. Mild degenerative changes are present without accelerated arthrosis. There is a prominent calcaneal spur. There is significant insertional tendinosis of the Achilles tendon with ossicles in the distal Achilles tendon.   Ligaments: The anterior and posterior tibiofibular and talofibular ligaments are intact. Deltoid ligament and spring ligaments are intact. The sinus tarsi is unremarkable.   Musculotendinous structures: The tibialis anterior, extensor digitorum and extensor hallucis longus tendons are unremarkable. The posterior tibial tendon, flexor hallucis longus and flexor digitorum tendons are unremarkable. Peroneal tendons demonstrate no significant abnormality. No significant tenosynovitis or tendinosis. There is chronic insertional tendinosis of the Achilles tendon with ossicles in the distal Achilles tendon. There is no full-thickness tear present. Trace retrocalcaneal bursal collection is identified. No significant thickening is seen in the plantar fascia.    IMPRESSION: Chronic thickening of the distal insertion of the Achilles tendon consistent with chronic tendinosis and ossicles are seen in the distal Achilles tendon. No full-thickness tear is present.   Mild degenerative change without accelerated arthrosis.   No acute abnormality is appreciated.  PATIENT SURVEYS:  LEFS: 48 / 80 = 60.0 %   COGNITION: Overall cognitive status: Within functional limits for tasks assessed     SENSATION: Light touch: Impaired , normal neuropathy  EDEMA:  Swelling reported especially after standing on it a while    PALPATION: Abnormal sensitivity posterior to lateral malleolus on left side  LOWER EXTREMITY ROM:  Active ROM Right eval Left eval  Hip flexion    Hip extension    Hip abduction    Hip adduction    Hip internal rotation    Hip external rotation    Knee flexion    Knee extension    Ankle dorsiflexion 19 8, pain  Ankle plantarflexion 34 34, pain in achilles and lateral compartment  Ankle inversion 23 28, pain  Ankle eversion 35 20, worst pain   (Blank rows = not tested)  LOWER EXTREMITY MMT:  MMT Right eval Left eval  Hip flexion  Hip extension    Hip abduction    Hip adduction    Hip internal rotation    Hip external rotation    Knee flexion 3+ 4  Knee extension 3+ 4  Ankle dorsiflexion 4+ 4+  Ankle plantarflexion 4+ 4+  Ankle inversion 4+ 3  Ankle eversion 4+ 3   (Blank rows = not tested)  LOWER EXTREMITY SPECIAL TESTS:  Ankle special tests: Great toe extension test: negative  FUNCTIONAL TESTS:  5 times sit to stand: 13.59 seconds, increased discomfort in LLE lateral compartment 2 minute walk test: 390 feet, increased pain starts about a minute in  SLS 03/01/24: R: 13.77 seconds L: 3.9 seconds, increase pain in ankle  GAIT: Distance walked: 440 Assistive device utilized: None Level of assistance: Complete Independence Comments: increased lateral to medial movement during stance phase of LLE,  possible compensation for R knee weakness although near symmetrical stride length noted                                                                                                                                TREATMENT DATE:  03/11/2024  Therapeutic Exercise: -Bike, seat 5, 5 minutes, level 1 resistance, pt cued for 50 spm -Standing heel raises, 2 sets of 10 reps, pt cued for decreased UE support -Standing calf stretch on incline, 2 bouts of 30 seconds -BAPS Board, 10 circles clockwise/counterclockwise, PF/DF taps, IV/EV taps, 10 each variations -Theraband resisted ankle movements, IV/EV/Dorsiflexion, 1 set of 15 reps, extra 15 reps with eversion with adjusted plantarflexion Neuromuscular Re-education: -Rocker board, DF/PF, IN/EV, 2 sets of 10 reps, pt cued for controlled movement and taps with decreased UE support -Dorsiflexion marches from 6 inch step, 10 lb kettle bell, pt cued for increased dorsiflexion contraction Therapeutic Activity: -Lateral step up and overs, 1 set of 5 reps, bilaterally, pt cued for minimal UE support, 6 inch step  03/09/2024 Manual to find trigger points left lower leg Trigger Point Dry Needling  Subsequent Treatment: Instructions provided previously at initial dry needling treatment.   Patient Verbal Consent Given: Yes Education Handout Provided: Previously Provided Muscles Treated: left peroneals and calf/gastroc and soleus Electrical Stimulation Performed: No Treatment Response/Outcome: twitch x 2 in calf; noted decreased tightness after treatment Dorsiflexion and inversion stretching manually 5 x 20 hold    03/02/2024 Review of HEP Review of dry needling instructions Manual to find trigger points left lower leg Trigger Point Dry Needling  Initial Treatment: Pt instructed on Dry Needling rational, procedures, and possible side effects. Pt instructed to expect mild to moderate muscle soreness later in the day and/or into the next day.  Pt  instructed in methods to reduce muscle soreness. Pt instructed to continue prescribed HEP. Patient was educated on signs and symptoms of infection and other risk factors and advised to seek medical attention should they occur.  Patient verbalized understanding of these instructions and education.   Patient Verbal Consent Given: Yes Education  Handout Provided: Yes Muscles Treated: left gastroc, soleus and peroneals Electrical Stimulation Performed: No Treatment Response/Outcome: multiple twitches; loosening of lower leg posterior musclulature  Manual stretching ankle inversion and dorsiflexion 3 x 20     PATIENT EDUCATION:  Education details: Pt was educated on findings of PT evaluation, prognosis, frequency of therapy visits and rationale, attendance policy, and HEP if given.   Person educated: Patient Education method: Explanation, Verbal cues, and Handouts Education comprehension: verbalized understanding, verbal cues required, and needs further education  HOME EXERCISE PROGRAM: Access Code: [email or web address] URL: [email or web address] Date: 03/01/2024 Prepared by: Lang Ada  Exercises - Supine Bridge  - 1 x daily - 7 x weekly - 3 sets - 10 reps - Seated Heel Raise  - 1 x daily - 7 x weekly - 3 sets - 10 reps - Soleus Stretch on Wall  - 1 x daily - 7 x weekly - 2 sets - 3 reps - 20 hold  ASSESSMENT:  CLINICAL IMPRESSION: Patient continues to demonstrate increased left lateral leg pain, decreased LE strength, decreased gait quality and balance. Patient also demonstrates fair endurance with aerobic based exercise during today's session. Patient able to progress dynamic balance and left ankle activation exercises today with good performance with verbal cueing. Patient would continue to benefit from skilled physical therapy for decreased LE pain, increased endurance with ambulation, increased LE strength, and improved balance for improved quality of life, improved  independence with management of LE pain and continued progress towards therapy goals.     Eval:Patient is a 48 y.o. female who was seen today for physical therapy evaluation and treatment for M76.72 (ICD-10-CM) - Peroneal tendinitis of left lower extremity.  Patient demonstrates decreased LLE strength, decreased L ankle ROM, abnormal pain in left ankle with palpation of peroneal tendons posterior to lateral malleolus, and impaired functional mobility and balance. Patient also demonstrates difficulty with ambulation during today's session with increased exaggeration of L stance phase and decreased velocity noted. Expected contributing factor is R knee weakness as pt states she fell on it and has been favoring that side. Patient also demonstrates pain with resisted eversion and inversion in lateral compartment and along achilles tendon. Patient requires education on role of PT, importance of HEP compliance and prognosis. Patient would benefit from skilled physical therapy for increased endurance with ambulation, increased LE strength/ROM, and balance for improved gait quality, return to higher level of function with ADLs, and progress towards therapy goals.    OBJECTIVE IMPAIRMENTS: Abnormal gait, decreased activity tolerance, decreased balance, decreased endurance, decreased mobility, difficulty walking, decreased ROM, decreased strength, hypomobility, impaired flexibility, impaired sensation, obesity, and pain.   ACTIVITY LIMITATIONS: carrying, lifting, bending, sitting, standing, squatting, stairs, transfers, and bed mobility  PARTICIPATION LIMITATIONS: meal prep, cleaning, laundry, shopping, community activity, and occupation  PERSONAL FACTORS: Age, Fitness, Past/current experiences, Time since onset of injury/illness/exacerbation, and 1 comorbidity: diabetes are also affecting patient's functional outcome.   REHAB POTENTIAL: Good  CLINICAL DECISION MAKING: Stable/uncomplicated  EVALUATION  COMPLEXITY: Low   GOALS: Goals reviewed with patient? No  SHORT TERM GOALS: Target date: 03/22/24  Patient will demonstrate evidence of independence with individualized HEP and will report compliance for at least 3 days per week for optimized progression towards remaining therapy goals. Baseline:  Goal status: INITIAL  2.  Patient will report a decrease in pain level during community ambulation by at least 2 points for improved quality of life. Baseline: 4/10 Goal status: INITIAL     LONG TERM GOALS: Target  date: 04/12/24  Pt will demonstrate a an increase of at least 9 points on the LEFS for improved performance of community ambulation and ADL. Baseline: see objective Goal status: INITIAL  2.  Pt will improve 2 MWT by 50 feet in order to demonstrate improved functional ambulatory capacity in community setting.  Baseline: see objective Goal status: INITIAL  3.  Pt will demonstrate pain free ROM in left ankle with a combined improvement of 12 degrees, for increased mobility and maximal efficiency of gait cycle during ambulation. Baseline: see objective Goal status: INITIAL  4.  Pt will demonstrate at least 4+/5 MMT for bilateral lower extremity for increased strength during ADL and community ambulation. Baseline: see objective Goal status: INITIAL  5.  Pt will improve SLS by 10 seconds bilaterally in order to improve balance during functional activities and transfers. Baseline: see objective Goal status: INITIAL    PLAN:  PT FREQUENCY: 1-2x/week  PT DURATION: 6 weeks  PLANNED INTERVENTIONS: 97110-Therapeutic exercises, 97530- Therapeutic activity, [id number]- Neuromuscular re-education, 97535- Self Care, [id number]- Manual therapy, [phone]- Gait training, [phone]- Electrical stimulation (unattended), [phone]- Electrical stimulation (manual), 20560 (1-2 muscles), 20561 (3+ muscles)- Dry Needling, Patient/Family education, Balance training, Stair training, Joint mobilization, Joint  manipulation, DME instructions, Cryotherapy, and Moist heat  PLAN FOR NEXT SESSION: dry needling, progress left ankle ROM and strengthening, review and progress HEP   Lang Ada, PT, DPT [facility] Office: [phone] 8:20 AM, 03/11/24

## 2024-03-22 ENCOUNTER — Encounter: Attending: Physical Medicine and Rehabilitation | Admitting: Physical Medicine and Rehabilitation

## 2024-03-22 VITALS — BP 147/86 | HR 94 | Ht 63.0 in | Wt 258.0 lb

## 2024-03-22 DIAGNOSIS — M7060 Trochanteric bursitis, unspecified hip: Secondary | ICD-10-CM | POA: Insufficient documentation

## 2024-03-22 DIAGNOSIS — M7062 Trochanteric bursitis, left hip: Secondary | ICD-10-CM

## 2024-03-22 MED ORDER — LIDOCAINE HCL 1 % IJ SOLN
5.0000 mL | Freq: Once | INTRAMUSCULAR | Status: AC
  Administered 2024-03-22: 5 mL via INTRADERMAL

## 2024-03-22 MED ORDER — BETAMETHASONE SOD PHOS & ACET 6 (3-3) MG/ML IJ SUSP
12.0000 mg | Freq: Once | INTRAMUSCULAR | Status: AC
  Administered 2024-03-22: 12 mg via INTRAMUSCULAR

## 2024-03-22 NOTE — Progress Notes (Signed)
 Trochanteric bursa injection, left Without ultrasound guidance  Indication Trochanteric bursitis. Exam has tenderness over the greater trochanter of the hip. Pain has not responded to conservative care such as exercise therapy and oral medications. Pain interferes with sleep or with mobility Informed consent was obtained after describing risks and benefits of the procedure with the patient these include bleeding bruising and infection. Patient has signed written consent form. Patient placed in a lateral decubitus position with the affected hip superior. Point of maximal pain was palpated marked and prepped with Betadine and entered with a needle to bone contact. Needle slightly withdrawn then 6mg  of betamethasone  with 4 cc 1% lidocaine  were injected. Patient tolerated procedure well. Post procedure instructions given.

## 2024-03-23 ENCOUNTER — Ambulatory Visit (HOSPITAL_COMMUNITY): Attending: Podiatry

## 2024-03-23 DIAGNOSIS — Z7409 Other reduced mobility: Secondary | ICD-10-CM | POA: Diagnosis present

## 2024-03-23 DIAGNOSIS — M79662 Pain in left lower leg: Secondary | ICD-10-CM | POA: Diagnosis present

## 2024-03-23 DIAGNOSIS — M25672 Stiffness of left ankle, not elsewhere classified: Secondary | ICD-10-CM | POA: Diagnosis present

## 2024-03-23 NOTE — Therapy (Signed)
 OUTPATIENT PHYSICAL THERAPY LOWER EXTREMITY TREATMENT   Patient Name: Miranda Ross MRN: [id number] DOB:03/04/1976, 48 y.o., female Today's Date: 03/23/2024  END OF SESSION:  PT End of Session - 03/23/24 0737     Visit Number 5    Number of Visits 12    Date for Recertification  04/12/24    Authorization Type [employer] OTHER    Authorization Time Period no auth required    PT Start Time 0735    PT Stop Time 0811    PT Time Calculation (min) 36 min    Activity Tolerance Patient tolerated treatment well;Patient limited by pain    Behavior During Therapy [facility] for tasks assessed/performed           Past Medical History:  Diagnosis Date   Arthritis    Asthma    uses albuterol  inh prn   Diabetes mellitus without complication (HCC)    NIDDM   PONV (postoperative nausea and vomiting)    Raynaud's disease    Sleep apnea 2013   sleep study 2013 at [place], didnt finish study, left early   Past Surgical History:  Procedure Laterality Date   ACHILLES TENDON SURGERY Right 08/01/2019   Procedure: RIGHT REPAIR ACHILLES TENDON AVULSION;  Surgeon: Barbarann Oneil BROCKS, MD;  Location: [facility] OR;  Service: Orthopedics;  Laterality: Right;   BACK SURGERY     cervical neck fusion 2002   FOOT SURGERY Left    topaz procedure   MASS EXCISION Left 09/02/2012   Procedure: MINOR EXCISION OF CYST LEFT RING FINGER A-3 PULLEY;  Surgeon: Lamar LULLA Leonor Mickey., MD;  Location: [facility];  Service: Orthopedics;  Laterality: Left;   neck fusion  2002   OOPHORECTOMY     right hand surgery  2003   WISDOM TOOTH EXTRACTION     WRIST SURGERY Right    TFCC repair 2003   Patient Active Problem List   Diagnosis Date Noted   Closed fracture of right proximal humerus 04/24/2020   Pain in right shoulder 04/04/2020   Shoulder subluxation, right 04/04/2020   S/P Achilles tendon repair 03/13/2020   Rupture of right Achilles tendon 08/01/2019   Achilles rupture, right 07/21/2019   Class 3  obesity with body mass index (BMI) of 50.0 to 59.9 in adult 01/29/2017   Polyclonal gammopathy determined by serum protein electrophoresis 08/21/2016   MGUS (monoclonal gammopathy of unknown significance) 08/18/2016   History of asthma 07/28/2016   Raynaud's disease without gangrene 07/24/2016   Primary osteoarthritis of both knees 07/24/2016   DJD (degenerative joint disease), cervical 07/24/2016   History of gastroesophageal reflux (GERD) 07/24/2016   ANA positive 07/24/2016   Trochanteric bursitis of both hips 07/24/2016   Hyperlipidemia 06/18/2013   Hematuria 06/18/2013    PCP: Lorilee Sven SQUIBB, MD   REFERRING PROVIDER: Gershon Donnice SAUNDERS, DPM  REFERRING DIAG: [phone] (ICD-10-CM) - Peroneal tendinitis of left lower extremity  THERAPY DIAG:  Pain in left lower leg  Decreased range of motion of left ankle  Impaired functional mobility, balance, gait, and endurance  Rationale for Evaluation and Treatment: Rehabilitation  ONSET DATE: 3-4 months ago  SUBJECTIVE:   SUBJECTIVE STATEMENT: Injection into left hip yesterday; supposed to take it easy for 48 hours or so.  Hurting a little in her lower leg today 4-5/10; does feel she is looser after the needling; gets relief for a couple of days.    Eval:Pt states onset of left lower leg pain was insidious, just kept  getting worse. Pt states it does not vary from morning to evening, just depends. Pt states that pain is intense at work after sitting for a while and first couple of steps. Pt states she suffered a partial achilles tear on RLE while just walking which required surgical intervention.   PERTINENT HISTORY: Rupture achilles on RLE while walking Diabetes PAIN:  Are you having pain? Yes: NPRS scale: 4/10 Pain location: lateral calf and down to ankle on left side Pain description: burning, poking Aggravating factors: a lot of walking, prolonged standing Relieving factors: ice, sit down, pain meds otc  PRECAUTIONS:  None  RED FLAGS: None   WEIGHT BEARING RESTRICTIONS: No  FALLS:  Has patient fallen in last 6 months? Yes. Number of falls 1, missed last step at daughters, lowered herself and landed on R knee  LIVING ENVIRONMENT: Lives with: lives with their spouse Lives in: House/apartment Stairs: Yes: External: 2 steps; none Has following equipment at home: None  OCCUPATION: Pharmacy tech  PLOF: Independent and Independent with basic ADLs  PATIENT GOALS: decreased pain, walk longer, stand longer (job requires it)  NEXT MD VISIT: after therapy  OBJECTIVE:  Note: Objective measures were completed at Evaluation unless otherwise noted.  DIAGNOSTIC FINDINGS: MR ANKLE WITHOUT IV CONTRAST LEFT   COMPARISON: None.   CLINICAL HISTORY: Ankle pain, tendon abnormality suspected.   PULSE SEQUENCES: Ax T1, Ax T2 FS, Sag T1, Sag T2 FS, Cor STIR, Ax T1 FS   FINDINGS:   Bones: There is no fracture or contusion pattern. Mild degenerative changes are present without accelerated arthrosis. There is a prominent calcaneal spur. There is significant insertional tendinosis of the Achilles tendon with ossicles in the distal Achilles tendon.   Ligaments: The anterior and posterior tibiofibular and talofibular ligaments are intact. Deltoid ligament and spring ligaments are intact. The sinus tarsi is unremarkable.   Musculotendinous structures: The tibialis anterior, extensor digitorum and extensor hallucis longus tendons are unremarkable. The posterior tibial tendon, flexor hallucis longus and flexor digitorum tendons are unremarkable. Peroneal tendons demonstrate no significant abnormality. No significant tenosynovitis or tendinosis. There is chronic insertional tendinosis of the Achilles tendon with ossicles in the distal Achilles tendon. There is no full-thickness tear present. Trace retrocalcaneal bursal collection is identified. No significant thickening is seen in the plantar fascia.    IMPRESSION: Chronic thickening of the distal insertion of the Achilles tendon consistent with chronic tendinosis and ossicles are seen in the distal Achilles tendon. No full-thickness tear is present.   Mild degenerative change without accelerated arthrosis.   No acute abnormality is appreciated.  PATIENT SURVEYS:  LEFS: 48 / 80 = 60.0 %   COGNITION: Overall cognitive status: Within functional limits for tasks assessed     SENSATION: Light touch: Impaired , normal neuropathy  EDEMA:  Swelling reported especially after standing on it a while    PALPATION: Abnormal sensitivity posterior to lateral malleolus on left side  LOWER EXTREMITY ROM:  Active ROM Right eval Left eval  Hip flexion    Hip extension    Hip abduction    Hip adduction    Hip internal rotation    Hip external rotation    Knee flexion    Knee extension    Ankle dorsiflexion 19 8, pain  Ankle plantarflexion 34 34, pain in achilles and lateral compartment  Ankle inversion 23 28, pain  Ankle eversion 35 20, worst pain   (Blank rows = not tested)  LOWER EXTREMITY MMT:  MMT Right eval Left  eval  Hip flexion    Hip extension    Hip abduction    Hip adduction    Hip internal rotation    Hip external rotation    Knee flexion 3+ 4  Knee extension 3+ 4  Ankle dorsiflexion 4+ 4+  Ankle plantarflexion 4+ 4+  Ankle inversion 4+ 3  Ankle eversion 4+ 3   (Blank rows = not tested)  LOWER EXTREMITY SPECIAL TESTS:  Ankle special tests: Great toe extension test: negative  FUNCTIONAL TESTS:  5 times sit to stand: 13.59 seconds, increased discomfort in LLE lateral compartment 2 minute walk test: 390 feet, increased pain starts about a minute in  SLS 03/01/24: R: 13.77 seconds L: 3.9 seconds, increase pain in ankle  GAIT: Distance walked: 440 Assistive device utilized: None Level of assistance: Complete Independence Comments: increased lateral to medial movement during stance phase of LLE,  possible compensation for R knee weakness although near symmetrical stride length noted                                                                                                                                TREATMENT DATE:  03/23/2024 Trigger Point Dry Needling  Subsequent Treatment: Instructions provided previously at initial dry needling treatment.   Patient Verbal Consent Given: Yes Education Handout Provided: Previously Provided Muscles Treated: left gastroc and soleus; peroneals Electrical Stimulation Performed: No Treatment Response/Outcome: multiple twitches lateral gastroc and peroneals  Prone  Manual STM to left calf and gastroc to decrease pain and improve soft tissue extensibility Manual stretching of gastroc and peroneals 10 x 20 holds     03/11/2024  Therapeutic Exercise: -Bike, seat 5, 5 minutes, level 1 resistance, pt cued for 50 spm -Standing heel raises, 2 sets of 10 reps, pt cued for decreased UE support -Standing calf stretch on incline, 2 bouts of 30 seconds -BAPS Board, 10 circles clockwise/counterclockwise, PF/DF taps, IV/EV taps, 10 each variations -Theraband resisted ankle movements, IV/EV/Dorsiflexion, 1 set of 15 reps, extra 15 reps with eversion with adjusted plantarflexion Neuromuscular Re-education: -Rocker board, DF/PF, IN/EV, 2 sets of 10 reps, pt cued for controlled movement and taps with decreased UE support -Dorsiflexion marches from 6 inch step, 10 lb kettle bell, pt cued for increased dorsiflexion contraction Therapeutic Activity: -Lateral step up and overs, 1 set of 5 reps, bilaterally, pt cued for minimal UE support, 6 inch step  03/09/2024 Manual to find trigger points left lower leg Trigger Point Dry Needling  Subsequent Treatment: Instructions provided previously at initial dry needling treatment.   Patient Verbal Consent Given: Yes Education Handout Provided: Previously Provided Muscles Treated: left peroneals and  calf/gastroc and soleus Electrical Stimulation Performed: No Treatment Response/Outcome: twitch x 2 in calf; noted decreased tightness after treatment Dorsiflexion and inversion stretching manually 5 x 20 hold    03/02/2024 Review of HEP Review of dry needling instructions Manual to find trigger points left lower leg Trigger Point Dry Needling  Initial Treatment: Pt instructed on Dry Needling rational, procedures, and possible side effects. Pt instructed to expect mild to moderate muscle soreness later in the day and/or into the next day.  Pt instructed in methods to reduce muscle soreness. Pt instructed to continue prescribed HEP. Patient was educated on signs and symptoms of infection and other risk factors and advised to seek medical attention should they occur.  Patient verbalized understanding of these instructions and education.   Patient Verbal Consent Given: Yes Education Handout Provided: Yes Muscles Treated: left gastroc, soleus and peroneals Electrical Stimulation Performed: No Treatment Response/Outcome: multiple twitches; loosening of lower leg posterior musclulature  Manual stretching ankle inversion and dorsiflexion 3 x 20     PATIENT EDUCATION:  Education details: Pt was educated on findings of PT evaluation, prognosis, frequency of therapy visits and rationale, attendance policy, and HEP if given.   Person educated: Patient Education method: Explanation, Verbal cues, and Handouts Education comprehension: verbalized understanding, verbal cues required, and needs further education  HOME EXERCISE PROGRAM: Access Code: [email or web address] URL: [email or web address] Date: 03/01/2024 Prepared by: Lang Ada  Exercises - Supine Bridge  - 1 x daily - 7 x weekly - 3 sets - 10 reps - Seated Heel Raise  - 1 x daily - 7 x weekly - 3 sets - 10 reps - Soleus Stretch on Wall  - 1 x daily - 7 x weekly - 2 sets - 3 reps - 20 hold  ASSESSMENT:  CLINICAL  IMPRESSION: Limited exercise today due to injection into her left hip.  Continue with dry needling and manual work to increase soft tissue extensibility and decrease pain.  Patient with noted tightness lateral aspect of calf as well as distally as the muscle meets the Achilles tendon.  Reports decreased pain and tension after needling today.   Patient would continue to benefit from skilled physical therapy for decreased LE pain, increased endurance with ambulation, increased LE strength, and improved balance for improved quality of life, improved independence with management of LE pain and continued progress towards therapy goals.     Eval:Patient is a 48 y.o. female who was seen today for physical therapy evaluation and treatment for M76.72 (ICD-10-CM) - Peroneal tendinitis of left lower extremity.  Patient demonstrates decreased LLE strength, decreased L ankle ROM, abnormal pain in left ankle with palpation of peroneal tendons posterior to lateral malleolus, and impaired functional mobility and balance. Patient also demonstrates difficulty with ambulation during today's session with increased exaggeration of L stance phase and decreased velocity noted. Expected contributing factor is R knee weakness as pt states she fell on it and has been favoring that side. Patient also demonstrates pain with resisted eversion and inversion in lateral compartment and along achilles tendon. Patient requires education on role of PT, importance of HEP compliance and prognosis. Patient would benefit from skilled physical therapy for increased endurance with ambulation, increased LE strength/ROM, and balance for improved gait quality, return to higher level of function with ADLs, and progress towards therapy goals.    OBJECTIVE IMPAIRMENTS: Abnormal gait, decreased activity tolerance, decreased balance, decreased endurance, decreased mobility, difficulty walking, decreased ROM, decreased strength, hypomobility, impaired  flexibility, impaired sensation, obesity, and pain.   ACTIVITY LIMITATIONS: carrying, lifting, bending, sitting, standing, squatting, stairs, transfers, and bed mobility  PARTICIPATION LIMITATIONS: meal prep, cleaning, laundry, shopping, community activity, and occupation  PERSONAL FACTORS: Age, Fitness, Past/current experiences, Time since onset of injury/illness/exacerbation, and 1 comorbidity: diabetes are also affecting patient's functional outcome.   REHAB POTENTIAL:  Good  CLINICAL DECISION MAKING: Stable/uncomplicated  EVALUATION COMPLEXITY: Low   GOALS: Goals reviewed with patient? No  SHORT TERM GOALS: Target date: 03/22/24  Patient will demonstrate evidence of independence with individualized HEP and will report compliance for at least 3 days per week for optimized progression towards remaining therapy goals. Baseline:  Goal status: INITIAL  2.  Patient will report a decrease in pain level during community ambulation by at least 2 points for improved quality of life. Baseline: 4/10 Goal status: INITIAL     LONG TERM GOALS: Target date: 04/12/24  Pt will demonstrate a an increase of at least 9 points on the LEFS for improved performance of community ambulation and ADL. Baseline: see objective Goal status: INITIAL  2.  Pt will improve 2 MWT by 50 feet in order to demonstrate improved functional ambulatory capacity in community setting.  Baseline: see objective Goal status: INITIAL  3.  Pt will demonstrate pain free ROM in left ankle with a combined improvement of 12 degrees, for increased mobility and maximal efficiency of gait cycle during ambulation. Baseline: see objective Goal status: INITIAL  4.  Pt will demonstrate at least 4+/5 MMT for bilateral lower extremity for increased strength during ADL and community ambulation. Baseline: see objective Goal status: INITIAL  5.  Pt will improve SLS by 10 seconds bilaterally in order to improve balance during  functional activities and transfers. Baseline: see objective Goal status: INITIAL    PLAN:  PT FREQUENCY: 1-2x/week  PT DURATION: 6 weeks  PLANNED INTERVENTIONS: 97110-Therapeutic exercises, 97530- Therapeutic activity, 97112- Neuromuscular re-education, 97535- Self Care, [id number]- Manual therapy, [phone]- Gait training, [phone]- Electrical stimulation (unattended), [phone]- Electrical stimulation (manual), [phone] (1-2 muscles), [phone] (3+ muscles)- Dry Needling, Patient/Family education, Balance training, Stair training, Joint mobilization, Joint manipulation, DME instructions, Cryotherapy, and Moist heat  PLAN FOR NEXT SESSION: dry needling, progress left ankle ROM and strengthening, review and progress HEP  8:15 AM, 03/23/24 Lavonne Kinderman Small Kashis Penley MPT [facility] [place] [phone] Ph:[phone]

## 2024-03-24 ENCOUNTER — Ambulatory Visit (HOSPITAL_COMMUNITY)

## 2024-03-29 ENCOUNTER — Ambulatory Visit (HOSPITAL_COMMUNITY)

## 2024-03-29 DIAGNOSIS — M79662 Pain in left lower leg: Secondary | ICD-10-CM | POA: Diagnosis not present

## 2024-03-29 DIAGNOSIS — Z7409 Other reduced mobility: Secondary | ICD-10-CM

## 2024-03-29 DIAGNOSIS — M25672 Stiffness of left ankle, not elsewhere classified: Secondary | ICD-10-CM

## 2024-03-29 NOTE — Therapy (Signed)
 OUTPATIENT PHYSICAL THERAPY LOWER EXTREMITY TREATMENT   Patient Name: Miranda Ross MRN: [id number] DOB:1975-05-31, 48 y.o., female Today's Date: 03/29/2024  END OF SESSION:  PT End of Session - 03/29/24 0735     Visit Number 6    Number of Visits 12    Date for Recertification  04/12/24    Authorization Type [employer] OTHER    Authorization Time Period no auth required    PT Start Time 0732    PT Stop Time 0812    PT Time Calculation (min) 40 min    Activity Tolerance Patient tolerated treatment well;Patient limited by pain    Behavior During Therapy [facility] for tasks assessed/performed           Past Medical History:  Diagnosis Date   Arthritis    Asthma    uses albuterol  inh prn   Diabetes mellitus without complication (HCC)    NIDDM   PONV (postoperative nausea and vomiting)    Raynaud's disease    Sleep apnea 2013   sleep study 2013 at [facility], didnt finish study, left early   Past Surgical History:  Procedure Laterality Date   ACHILLES TENDON SURGERY Right 08/01/2019   Procedure: RIGHT REPAIR ACHILLES TENDON AVULSION;  Surgeon: Barbarann Oneil BROCKS, MD;  Location: [facility] OR;  Service: Orthopedics;  Laterality: Right;   BACK SURGERY     cervical neck fusion 2002   FOOT SURGERY Left    topaz procedure   MASS EXCISION Left 09/02/2012   Procedure: MINOR EXCISION OF CYST LEFT RING FINGER A-3 PULLEY;  Surgeon: Lamar LULLA Leonor Mickey., MD;  Location: [facility];  Service: Orthopedics;  Laterality: Left;   neck fusion  2002   OOPHORECTOMY     right hand surgery  2003   WISDOM TOOTH EXTRACTION     WRIST SURGERY Right    TFCC repair 2003   Patient Active Problem List   Diagnosis Date Noted   Closed fracture of right proximal humerus 04/24/2020   Pain in right shoulder 04/04/2020   Shoulder subluxation, right 04/04/2020   S/P Achilles tendon repair 03/13/2020   Rupture of right Achilles tendon 08/01/2019   Achilles rupture, right 07/21/2019   Class 3  obesity with body mass index (BMI) of 50.0 to 59.9 in adult 01/29/2017   Polyclonal gammopathy determined by serum protein electrophoresis 08/21/2016   MGUS (monoclonal gammopathy of unknown significance) 08/18/2016   History of asthma 07/28/2016   Raynaud's disease without gangrene 07/24/2016   Primary osteoarthritis of both knees 07/24/2016   DJD (degenerative joint disease), cervical 07/24/2016   History of gastroesophageal reflux (GERD) 07/24/2016   ANA positive 07/24/2016   Trochanteric bursitis of both hips 07/24/2016   Hyperlipidemia 06/18/2013   Hematuria 06/18/2013    PCP: Lorilee Sven SQUIBB, MD   REFERRING PROVIDER: Gershon Donnice SAUNDERS, DPM  REFERRING DIAG: [phone] (ICD-10-CM) - Peroneal tendinitis of left lower extremity  THERAPY DIAG:  Pain in left lower leg  Decreased range of motion of left ankle  Impaired functional mobility, balance, gait, and endurance  Rationale for Evaluation and Treatment: Rehabilitation  ONSET DATE: 3-4 months ago  SUBJECTIVE:   SUBJECTIVE STATEMENT: Has been sick with a stomach bug over the weekend; was laying on hip so left hip is sore today; didn't go to work yesterday.  Feeling wiped out today.  Achilles area is burning today.  5/10 pain today; cold weather seems to make it worse.     Eval:Pt states onset of  left lower leg pain was insidious, just kept getting worse. Pt states it does not vary from morning to evening, just depends. Pt states that pain is intense at work after sitting for a while and first couple of steps. Pt states she suffered a partial achilles tear on RLE while just walking which required surgical intervention.   PERTINENT HISTORY: Rupture achilles on RLE while walking Diabetes PAIN:  Are you having pain? Yes: NPRS scale: 4/10 Pain location: lateral calf and down to ankle on left side Pain description: burning, poking Aggravating factors: a lot of walking, prolonged standing Relieving factors: ice, sit down,  pain meds otc  PRECAUTIONS: None  RED FLAGS: None   WEIGHT BEARING RESTRICTIONS: No  FALLS:  Has patient fallen in last 6 months? Yes. Number of falls 1, missed last step at daughters, lowered herself and landed on R knee  LIVING ENVIRONMENT: Lives with: lives with their spouse Lives in: House/apartment Stairs: Yes: External: 2 steps; none Has following equipment at home: None  OCCUPATION: Pharmacy tech  PLOF: Independent and Independent with basic ADLs  PATIENT GOALS: decreased pain, walk longer, stand longer (job requires it)  NEXT MD VISIT: after therapy  OBJECTIVE:  Note: Objective measures were completed at Evaluation unless otherwise noted.  DIAGNOSTIC FINDINGS: MR ANKLE WITHOUT IV CONTRAST LEFT   COMPARISON: None.   CLINICAL HISTORY: Ankle pain, tendon abnormality suspected.   PULSE SEQUENCES: Ax T1, Ax T2 FS, Sag T1, Sag T2 FS, Cor STIR, Ax T1 FS   FINDINGS:   Bones: There is no fracture or contusion pattern. Mild degenerative changes are present without accelerated arthrosis. There is a prominent calcaneal spur. There is significant insertional tendinosis of the Achilles tendon with ossicles in the distal Achilles tendon.   Ligaments: The anterior and posterior tibiofibular and talofibular ligaments are intact. Deltoid ligament and spring ligaments are intact. The sinus tarsi is unremarkable.   Musculotendinous structures: The tibialis anterior, extensor digitorum and extensor hallucis longus tendons are unremarkable. The posterior tibial tendon, flexor hallucis longus and flexor digitorum tendons are unremarkable. Peroneal tendons demonstrate no significant abnormality. No significant tenosynovitis or tendinosis. There is chronic insertional tendinosis of the Achilles tendon with ossicles in the distal Achilles tendon. There is no full-thickness tear present. Trace retrocalcaneal bursal collection is identified. No significant thickening is seen in  the plantar fascia.   IMPRESSION: Chronic thickening of the distal insertion of the Achilles tendon consistent with chronic tendinosis and ossicles are seen in the distal Achilles tendon. No full-thickness tear is present.   Mild degenerative change without accelerated arthrosis.   No acute abnormality is appreciated.  PATIENT SURVEYS:  LEFS: 48 / 80 = 60.0 %   COGNITION: Overall cognitive status: Within functional limits for tasks assessed     SENSATION: Light touch: Impaired , normal neuropathy  EDEMA:  Swelling reported especially after standing on it a while    PALPATION: Abnormal sensitivity posterior to lateral malleolus on left side  LOWER EXTREMITY ROM:  Active ROM Right eval Left eval  Hip flexion    Hip extension    Hip abduction    Hip adduction    Hip internal rotation    Hip external rotation    Knee flexion    Knee extension    Ankle dorsiflexion 19 8, pain  Ankle plantarflexion 34 34, pain in achilles and lateral compartment  Ankle inversion 23 28, pain  Ankle eversion 35 20, worst pain   (Blank rows = not tested)  LOWER EXTREMITY MMT:  MMT Right eval Left eval  Hip flexion    Hip extension    Hip abduction    Hip adduction    Hip internal rotation    Hip external rotation    Knee flexion 3+ 4  Knee extension 3+ 4  Ankle dorsiflexion 4+ 4+  Ankle plantarflexion 4+ 4+  Ankle inversion 4+ 3  Ankle eversion 4+ 3   (Blank rows = not tested)  LOWER EXTREMITY SPECIAL TESTS:  Ankle special tests: Great toe extension test: negative  FUNCTIONAL TESTS:  5 times sit to stand: 13.59 seconds, increased discomfort in LLE lateral compartment 2 minute walk test: 390 feet, increased pain starts about a minute in  SLS 03/01/24: R: 13.77 seconds L: 3.9 seconds, increase pain in ankle  GAIT: Distance walked: 440 Assistive device utilized: None Level of assistance: Complete Independence Comments: increased lateral to medial movement during  stance phase of LLE, possible compensation for R knee weakness although near symmetrical stride length noted                                                                                                                                TREATMENT DATE:  03/29/2024 Manual to find trigger points left calf Trigger Point Dry Needling  Subsequent Treatment: Instructions provided previously at initial dry needling treatment.   Patient Verbal Consent Given: Yes Education Handout Provided: Previously Provided Muscles Treated: left gastroc/soleus and peroneals Electrical Stimulation Performed: No Treatment Response/Outcome: multiple twitches; decreased calf tightness  Manual STM to left calf x 12' to decrease pain and improve soft tissue extensibility Manual stretching of left calf and peroneals   03/23/2024 Trigger Point Dry Needling  Subsequent Treatment: Instructions provided previously at initial dry needling treatment.   Patient Verbal Consent Given: Yes Education Handout Provided: Previously Provided Muscles Treated: left gastroc and soleus; peroneals Electrical Stimulation Performed: No Treatment Response/Outcome: multiple twitches lateral gastroc and peroneals  Prone  Manual STM to left calf and gastroc to decrease pain and improve soft tissue extensibility Manual stretching of gastroc and peroneals 10 x 20 holds     03/11/2024  Therapeutic Exercise: -Bike, seat 5, 5 minutes, level 1 resistance, pt cued for 50 spm -Standing heel raises, 2 sets of 10 reps, pt cued for decreased UE support -Standing calf stretch on incline, 2 bouts of 30 seconds -BAPS Board, 10 circles clockwise/counterclockwise, PF/DF taps, IV/EV taps, 10 each variations -Theraband resisted ankle movements, IV/EV/Dorsiflexion, 1 set of 15 reps, extra 15 reps with eversion with adjusted plantarflexion Neuromuscular Re-education: -Rocker board, DF/PF, IN/EV, 2 sets of 10 reps, pt cued for controlled  movement and taps with decreased UE support -Dorsiflexion marches from 6 inch step, 10 lb kettle bell, pt cued for increased dorsiflexion contraction Therapeutic Activity: -Lateral step up and overs, 1 set of 5 reps, bilaterally, pt cued for minimal UE support, 6 inch step  03/09/2024 Manual to find trigger points left lower  leg Trigger Point Dry Needling  Subsequent Treatment: Instructions provided previously at initial dry needling treatment.   Patient Verbal Consent Given: Yes Education Handout Provided: Previously Provided Muscles Treated: left peroneals and calf/gastroc and soleus Electrical Stimulation Performed: No Treatment Response/Outcome: twitch x 2 in calf; noted decreased tightness after treatment Dorsiflexion and inversion stretching manually 5 x 20 hold    03/02/2024 Review of HEP Review of dry needling instructions Manual to find trigger points left lower leg Trigger Point Dry Needling  Initial Treatment: Pt instructed on Dry Needling rational, procedures, and possible side effects. Pt instructed to expect mild to moderate muscle soreness later in the day and/or into the next day.  Pt instructed in methods to reduce muscle soreness. Pt instructed to continue prescribed HEP. Patient was educated on signs and symptoms of infection and other risk factors and advised to seek medical attention should they occur.  Patient verbalized understanding of these instructions and education.   Patient Verbal Consent Given: Yes Education Handout Provided: Yes Muscles Treated: left gastroc, soleus and peroneals Electrical Stimulation Performed: No Treatment Response/Outcome: multiple twitches; loosening of lower leg posterior musclulature  Manual stretching ankle inversion and dorsiflexion 3 x 20     PATIENT EDUCATION:  Education details: Pt was educated on findings of PT evaluation, prognosis, frequency of therapy visits and rationale, attendance policy, and HEP if  given.   Person educated: Patient Education method: Explanation, Verbal cues, and Handouts Education comprehension: verbalized understanding, verbal cues required, and needs further education  HOME EXERCISE PROGRAM: Access Code: [email or web address] URL: [email or web address] Date: 03/01/2024 Prepared by: Lang Ada  Exercises - Supine Bridge  - 1 x daily - 7 x weekly - 3 sets - 10 reps - Seated Heel Raise  - 1 x daily - 7 x weekly - 3 sets - 10 reps - Soleus Stretch on Wall  - 1 x daily - 7 x weekly - 2 sets - 3 reps - 20 hold  ASSESSMENT:  CLINICAL IMPRESSION: Continued tightness left calf.  Continued with interventions to decrease soft tissue tightness including dry needing, manual soft tissue massage and stretching. Of note tightness is mostly lateral calf and towards Achilles origin.  Decreased tightness noted after treatment.    Patient would continue to benefit from skilled physical therapy for decreased LE pain, increased endurance with ambulation, increased LE strength, and improved balance for improved quality of life, improved independence with management of LE pain and continued progress towards therapy goals.     Eval:Patient is a 48 y.o. female who was seen today for physical therapy evaluation and treatment for M76.72 (ICD-10-CM) - Peroneal tendinitis of left lower extremity.  Patient demonstrates decreased LLE strength, decreased L ankle ROM, abnormal pain in left ankle with palpation of peroneal tendons posterior to lateral malleolus, and impaired functional mobility and balance. Patient also demonstrates difficulty with ambulation during today's session with increased exaggeration of L stance phase and decreased velocity noted. Expected contributing factor is R knee weakness as pt states she fell on it and has been favoring that side. Patient also demonstrates pain with resisted eversion and inversion in lateral compartment and along achilles tendon. Patient requires  education on role of PT, importance of HEP compliance and prognosis. Patient would benefit from skilled physical therapy for increased endurance with ambulation, increased LE strength/ROM, and balance for improved gait quality, return to higher level of function with ADLs, and progress towards therapy goals.    OBJECTIVE IMPAIRMENTS: Abnormal gait, decreased activity tolerance, decreased balance,  decreased endurance, decreased mobility, difficulty walking, decreased ROM, decreased strength, hypomobility, impaired flexibility, impaired sensation, obesity, and pain.   ACTIVITY LIMITATIONS: carrying, lifting, bending, sitting, standing, squatting, stairs, transfers, and bed mobility  PARTICIPATION LIMITATIONS: meal prep, cleaning, laundry, shopping, community activity, and occupation  PERSONAL FACTORS: Age, Fitness, Past/current experiences, Time since onset of injury/illness/exacerbation, and 1 comorbidity: diabetes are also affecting patient's functional outcome.   REHAB POTENTIAL: Good  CLINICAL DECISION MAKING: Stable/uncomplicated  EVALUATION COMPLEXITY: Low   GOALS: Goals reviewed with patient? No  SHORT TERM GOALS: Target date: 03/22/24  Patient will demonstrate evidence of independence with individualized HEP and will report compliance for at least 3 days per week for optimized progression towards remaining therapy goals. Baseline:  Goal status: INITIAL  2.  Patient will report a decrease in pain level during community ambulation by at least 2 points for improved quality of life. Baseline: 4/10 Goal status: INITIAL     LONG TERM GOALS: Target date: 04/12/24  Pt will demonstrate a an increase of at least 9 points on the LEFS for improved performance of community ambulation and ADL. Baseline: see objective Goal status: INITIAL  2.  Pt will improve 2 MWT by 50 feet in order to demonstrate improved functional ambulatory capacity in community setting.  Baseline: see  objective Goal status: INITIAL  3.  Pt will demonstrate pain free ROM in left ankle with a combined improvement of 12 degrees, for increased mobility and maximal efficiency of gait cycle during ambulation. Baseline: see objective Goal status: INITIAL  4.  Pt will demonstrate at least 4+/5 MMT for bilateral lower extremity for increased strength during ADL and community ambulation. Baseline: see objective Goal status: INITIAL  5.  Pt will improve SLS by 10 seconds bilaterally in order to improve balance during functional activities and transfers. Baseline: see objective Goal status: INITIAL    PLAN:  PT FREQUENCY: 1-2x/week  PT DURATION: 6 weeks  PLANNED INTERVENTIONS: 97110-Therapeutic exercises, 97530- Therapeutic activity, 97112- Neuromuscular re-education, 97535- Self Care, [id number]- Manual therapy, [phone]- Gait training, [phone]- Electrical stimulation (unattended), [phone]- Electrical stimulation (manual), [phone] (1-2 muscles), [phone] (3+ muscles)- Dry Needling, Patient/Family education, Balance training, Stair training, Joint mobilization, Joint manipulation, DME instructions, Cryotherapy, and Moist heat  PLAN FOR NEXT SESSION: dry needling, progress left ankle ROM and strengthening, review and progress HEP  8:15 AM, 03/29/24 Oseph Imburgia Small Gordan Grell MPT [facility] [phone] Ph:[phone]

## 2024-03-31 ENCOUNTER — Ambulatory Visit (HOSPITAL_COMMUNITY)

## 2024-03-31 ENCOUNTER — Telehealth (HOSPITAL_COMMUNITY): Payer: Self-pay

## 2024-03-31 NOTE — Telephone Encounter (Signed)
 Pt was called concerning her missed treatment session this morning. Pt was left message to return call at earliest convenience for POC update as this was her last schedule visit. This is no show #1.  Lang Ada, PT, DPT [facility] Office: [phone] 1:44 PM, 03/31/24

## 2024-05-10 ENCOUNTER — Encounter: Payer: Self-pay | Admitting: Nurse Practitioner

## 2024-05-10 MED ORDER — ONDANSETRON 4 MG PO TBDP: 4.0000 mg | ORAL_TABLET | Freq: Three times a day (TID) | ORAL | 0 refills | Status: AC | PRN

## 2024-06-23 ENCOUNTER — Encounter: Admitting: Physical Medicine and Rehabilitation

## 2024-07-06 ENCOUNTER — Ambulatory Visit: Admitting: Nurse Practitioner
